# Patient Record
Sex: Female | Born: 1960
Health system: Southern US, Community
[De-identification: ages and names within clinical notes are randomized; demographics above are authoritative.]

## PROBLEM LIST (undated history)

## (undated) DIAGNOSIS — K2270 Barrett's esophagus without dysplasia: Secondary | ICD-10-CM

## (undated) DIAGNOSIS — T7840XA Allergy, unspecified, initial encounter: Secondary | ICD-10-CM

## (undated) DIAGNOSIS — E785 Hyperlipidemia, unspecified: Secondary | ICD-10-CM

## (undated) DIAGNOSIS — K219 Gastro-esophageal reflux disease without esophagitis: Secondary | ICD-10-CM

## (undated) DIAGNOSIS — I10 Essential (primary) hypertension: Secondary | ICD-10-CM

## (undated) HISTORY — DX: Hyperlipidemia, unspecified: E78.5

## (undated) HISTORY — PX: COLONOSCOPY: SHX174

## (undated) HISTORY — PX: TUBAL LIGATION: SHX77

## (undated) HISTORY — DX: Allergy, unspecified, initial encounter: T78.40XA

## (undated) HISTORY — DX: Gastro-esophageal reflux disease without esophagitis: K21.9

## (undated) HISTORY — DX: Essential (primary) hypertension: I10

## (undated) HISTORY — PX: THROAT SURGERY: SHX803

---

## 2007-12-11 ENCOUNTER — Ambulatory Visit: Payer: Self-pay | Admitting: Internal Medicine

## 2007-12-21 ENCOUNTER — Ambulatory Visit: Payer: Self-pay | Admitting: Family Medicine

## 2007-12-23 ENCOUNTER — Ambulatory Visit: Payer: Self-pay | Admitting: Family Medicine

## 2007-12-27 ENCOUNTER — Ambulatory Visit: Payer: Self-pay | Admitting: Family Medicine

## 2008-01-02 ENCOUNTER — Ambulatory Visit: Payer: Self-pay | Admitting: Family Medicine

## 2008-01-09 ENCOUNTER — Ambulatory Visit: Payer: Self-pay | Admitting: Internal Medicine

## 2008-01-10 ENCOUNTER — Ambulatory Visit: Payer: Self-pay | Admitting: Internal Medicine

## 2008-02-10 ENCOUNTER — Ambulatory Visit: Payer: Self-pay | Admitting: Internal Medicine

## 2008-02-22 ENCOUNTER — Ambulatory Visit: Payer: Self-pay | Admitting: Unknown Physician Specialty

## 2008-03-12 ENCOUNTER — Ambulatory Visit: Payer: Self-pay | Admitting: Internal Medicine

## 2008-05-04 ENCOUNTER — Ambulatory Visit: Payer: Self-pay | Admitting: Internal Medicine

## 2008-05-10 ENCOUNTER — Ambulatory Visit: Payer: Self-pay | Admitting: Internal Medicine

## 2008-07-10 ENCOUNTER — Ambulatory Visit: Payer: Self-pay | Admitting: Internal Medicine

## 2008-07-23 ENCOUNTER — Ambulatory Visit: Payer: Self-pay | Admitting: Internal Medicine

## 2008-08-09 ENCOUNTER — Ambulatory Visit: Payer: Self-pay | Admitting: Internal Medicine

## 2008-12-24 ENCOUNTER — Ambulatory Visit: Payer: Self-pay | Admitting: Family Medicine

## 2009-01-05 ENCOUNTER — Emergency Department: Payer: Self-pay | Admitting: Emergency Medicine

## 2009-08-22 ENCOUNTER — Ambulatory Visit: Payer: Self-pay | Admitting: Unknown Physician Specialty

## 2009-10-28 ENCOUNTER — Ambulatory Visit: Payer: Self-pay | Admitting: Gastroenterology

## 2009-11-02 ENCOUNTER — Emergency Department: Payer: Self-pay | Admitting: Unknown Physician Specialty

## 2009-12-26 ENCOUNTER — Ambulatory Visit: Payer: Self-pay | Admitting: Family Medicine

## 2010-09-25 ENCOUNTER — Ambulatory Visit: Payer: Self-pay | Admitting: Family Medicine

## 2011-12-09 ENCOUNTER — Ambulatory Visit: Payer: Self-pay | Admitting: Family Medicine

## 2012-05-20 ENCOUNTER — Ambulatory Visit: Payer: Self-pay | Admitting: Family Medicine

## 2012-12-09 ENCOUNTER — Ambulatory Visit: Payer: Self-pay | Admitting: Family Medicine

## 2012-12-27 ENCOUNTER — Ambulatory Visit: Payer: Self-pay | Admitting: Family Medicine

## 2013-06-27 ENCOUNTER — Ambulatory Visit: Payer: Self-pay | Admitting: Family Medicine

## 2013-11-01 DIAGNOSIS — R195 Other fecal abnormalities: Secondary | ICD-10-CM | POA: Insufficient documentation

## 2013-11-14 ENCOUNTER — Ambulatory Visit: Payer: Self-pay | Admitting: Gastroenterology

## 2013-11-16 LAB — PATHOLOGY REPORT

## 2013-12-29 ENCOUNTER — Ambulatory Visit: Payer: Self-pay | Admitting: Family Medicine

## 2014-07-18 DIAGNOSIS — Z0000 Encounter for general adult medical examination without abnormal findings: Secondary | ICD-10-CM | POA: Insufficient documentation

## 2014-07-18 DIAGNOSIS — K219 Gastro-esophageal reflux disease without esophagitis: Secondary | ICD-10-CM | POA: Insufficient documentation

## 2014-07-18 DIAGNOSIS — E785 Hyperlipidemia, unspecified: Secondary | ICD-10-CM | POA: Insufficient documentation

## 2014-07-18 DIAGNOSIS — IMO0002 Reserved for concepts with insufficient information to code with codable children: Secondary | ICD-10-CM | POA: Insufficient documentation

## 2014-07-18 DIAGNOSIS — E789 Disorder of lipoprotein metabolism, unspecified: Secondary | ICD-10-CM | POA: Insufficient documentation

## 2014-07-18 DIAGNOSIS — E7849 Other hyperlipidemia: Secondary | ICD-10-CM | POA: Insufficient documentation

## 2014-07-18 DIAGNOSIS — S139XXA Sprain of joints and ligaments of unspecified parts of neck, initial encounter: Secondary | ICD-10-CM | POA: Insufficient documentation

## 2014-07-18 DIAGNOSIS — I10 Essential (primary) hypertension: Secondary | ICD-10-CM | POA: Insufficient documentation

## 2014-07-18 DIAGNOSIS — R928 Other abnormal and inconclusive findings on diagnostic imaging of breast: Secondary | ICD-10-CM | POA: Insufficient documentation

## 2014-07-18 DIAGNOSIS — M1710 Unilateral primary osteoarthritis, unspecified knee: Secondary | ICD-10-CM | POA: Insufficient documentation

## 2014-07-18 DIAGNOSIS — A184 Tuberculosis of skin and subcutaneous tissue: Secondary | ICD-10-CM | POA: Insufficient documentation

## 2014-07-18 DIAGNOSIS — S161XXA Strain of muscle, fascia and tendon at neck level, initial encounter: Secondary | ICD-10-CM

## 2014-07-18 DIAGNOSIS — N6459 Other signs and symptoms in breast: Secondary | ICD-10-CM | POA: Insufficient documentation

## 2014-07-18 DIAGNOSIS — M179 Osteoarthritis of knee, unspecified: Secondary | ICD-10-CM | POA: Insufficient documentation

## 2014-07-19 ENCOUNTER — Ambulatory Visit (INDEPENDENT_AMBULATORY_CARE_PROVIDER_SITE_OTHER): Payer: BLUE CROSS/BLUE SHIELD | Admitting: Family Medicine

## 2014-07-19 ENCOUNTER — Encounter: Payer: Self-pay | Admitting: Family Medicine

## 2014-07-19 VITALS — BP 144/100 | HR 80 | Ht 64.0 in | Wt 219.0 lb

## 2014-07-19 DIAGNOSIS — M1712 Unilateral primary osteoarthritis, left knee: Secondary | ICD-10-CM

## 2014-07-19 DIAGNOSIS — I10 Essential (primary) hypertension: Secondary | ICD-10-CM | POA: Diagnosis not present

## 2014-07-19 DIAGNOSIS — E784 Other hyperlipidemia: Secondary | ICD-10-CM

## 2014-07-19 DIAGNOSIS — E7849 Other hyperlipidemia: Secondary | ICD-10-CM

## 2014-07-19 DIAGNOSIS — K219 Gastro-esophageal reflux disease without esophagitis: Secondary | ICD-10-CM

## 2014-07-19 MED ORDER — HYDROCHLOROTHIAZIDE 12.5 MG PO TABS: 12.5000 mg | ORAL_TABLET | Freq: Every day | ORAL | Status: DC

## 2014-07-19 MED ORDER — SIMVASTATIN 20 MG PO TABS: 20.0000 mg | ORAL_TABLET | Freq: Every day | ORAL | Status: DC

## 2014-07-19 MED ORDER — OMEPRAZOLE 20 MG PO CPDR: 20.0000 mg | DELAYED_RELEASE_CAPSULE | Freq: Two times a day (BID) | ORAL | Status: DC

## 2014-07-19 MED ORDER — CELECOXIB 200 MG PO CAPS: 200.0000 mg | ORAL_CAPSULE | Freq: Every day | ORAL | Status: DC

## 2014-07-19 MED ORDER — LISINOPRIL 10 MG PO TABS: 10.0000 mg | ORAL_TABLET | Freq: Every day | ORAL | Status: DC

## 2014-07-19 NOTE — Progress Notes (Signed)
Name: Susan Gregory   MRN: [id number]    DOB: Apr 12, 1960   Date:07/19/2014       Progress Note  Subjective  Chief Complaint  Chief Complaint  Patient presents with  . Hyperlipidemia  . Hypertension  . Knee Pain    celebrex refill  . Allergic Rhinitis     Hyperlipidemia This is a recurrent problem. The current episode started more than 1 year ago. The problem is controlled. Recent lipid tests were reviewed and are normal. She has no history of chronic renal disease, diabetes, hypothyroidism, liver disease, obesity or nephrotic syndrome. There are no known factors aggravating her hyperlipidemia. Pertinent negatives include no chest pain, focal sensory loss, focal weakness, leg pain, myalgias or shortness of breath. Current antihyperlipidemic treatment includes statins. The current treatment provides moderate improvement of lipids. There are no compliance problems.  Risk factors for coronary artery disease include hypertension.  Hypertension The current episode started more than 1 year ago. The problem is unchanged. The problem is uncontrolled. Pertinent negatives include no anxiety, blurred vision, chest pain, headaches, malaise/fatigue, neck pain, orthopnea, palpitations, peripheral edema, PND, shortness of breath or sweats. There are no associated agents to hypertension. Risk factors for coronary artery disease include dyslipidemia. Past treatments include nothing. There is no history of chronic renal disease.  Knee Pain  The incident occurred more than 1 week ago. The incident occurred at home. There was no injury mechanism. The pain is present in the left knee. The quality of the pain is described as aching. The pain is mild. The pain has been fluctuating since onset. Pertinent negatives include no inability to bear weight or loss of motion. She has tried acetaminophen and NSAIDs for the symptoms. The treatment provided mild relief.    No problem-specific assessment & plan notes found for this  encounter.   Past Medical History  Diagnosis Date  . Hypertension   . Hyperlipidemia   . GERD (gastroesophageal reflux disease)     Past Surgical History  Procedure Laterality Date  . Throat surgery      nodules taken off throat    History reviewed. No pertinent family history.  History   Social History  . Marital Status: Married    Spouse Name: N/A  . Number of Children: N/A  . Years of Education: N/A   Occupational History  . Not on file.   Social History Main Topics  . Smoking status: Never Smoker   . Smokeless tobacco: Not on file  . Alcohol Use: No  . Drug Use: No  . Sexual Activity: Not on file   Other Topics Concern  . Not on file   Social History Narrative  . No narrative on file    No Known Allergies   Review of Systems  Constitutional: Negative for malaise/fatigue.  Eyes: Negative for blurred vision.  Respiratory: Negative for shortness of breath.   Cardiovascular: Negative for chest pain, palpitations, orthopnea and PND.  Musculoskeletal: Negative for myalgias and neck pain.  Neurological: Negative for focal weakness and headaches.     Objective  Filed Vitals:   07/19/14 0935  BP: 144/100  Pulse: 80  Height: 5\' 4"  (1.626 m)  Weight: 219 lb (99.338 kg)    Physical Exam    No results found for this or any previous visit (from the past 2160 hour(s)).   Assessment & Plan  Problem List Items Addressed This Visit      Cardiovascular and Mediastinum   Benign hypertension - Primary  Relevant Medications   aspirin 81 MG tablet   hydrochlorothiazide (HYDRODIURIL) 12.5 MG tablet   simvastatin (ZOCOR) 20 MG tablet     Digestive   Acid reflux   Relevant Medications   omeprazole (PRILOSEC) 20 MG capsule   ranitidine (ZANTAC) 300 MG capsule     Musculoskeletal and Integument   Arthritis of knee, degenerative   Relevant Medications   aspirin 81 MG tablet   celecoxib (CELEBREX) 200 MG capsule     Other   Familial multiple  lipoprotein-type hyperlipidemia   Relevant Medications   aspirin 81 MG tablet   hydrochlorothiazide (HYDRODIURIL) 12.5 MG tablet   simvastatin (ZOCOR) 20 MG tablet        Dr. Hayden [facility]  07/19/2014

## 2014-08-30 ENCOUNTER — Other Ambulatory Visit: Payer: Self-pay

## 2014-08-30 ENCOUNTER — Encounter: Payer: Self-pay | Admitting: Family Medicine

## 2014-08-30 ENCOUNTER — Ambulatory Visit (INDEPENDENT_AMBULATORY_CARE_PROVIDER_SITE_OTHER): Payer: BLUE CROSS/BLUE SHIELD | Admitting: Family Medicine

## 2014-08-30 VITALS — BP 126/80 | HR 78 | Wt 211.8 lb

## 2014-08-30 DIAGNOSIS — I10 Essential (primary) hypertension: Secondary | ICD-10-CM

## 2014-08-30 MED ORDER — LISINOPRIL 10 MG PO TABS: 10.0000 mg | ORAL_TABLET | Freq: Every day | ORAL | Status: DC

## 2014-08-30 MED ORDER — HYDROCHLOROTHIAZIDE 12.5 MG PO TABS: 12.5000 mg | ORAL_TABLET | Freq: Every day | ORAL | Status: DC

## 2014-08-30 MED ORDER — LISINOPRIL 10 MG PO TABS
10.0000 mg | ORAL_TABLET | Freq: Every day | ORAL | Status: DC
Start: 2014-08-30 — End: 2014-08-30

## 2014-08-30 NOTE — Progress Notes (Signed)
Name: Susan Gregory   MRN: [id number]    DOB: 1960-06-02   Date:08/30/2014       Progress Note  Subjective  Chief Complaint  Chief Complaint  Patient presents with  . Follow-up    Hypertension     Hypertension This is a recurrent problem. The current episode started more than 1 year ago. The problem has been gradually improving since onset. The problem is controlled. Pertinent negatives include no anxiety, blurred vision, chest pain, headaches, malaise/fatigue, neck pain, orthopnea, palpitations, peripheral edema, PND, shortness of breath or sweats. Agents associated with hypertension include NSAIDs. Risk factors for coronary artery disease include dyslipidemia and obesity. Past treatments include ACE inhibitors and diuretics. The current treatment provides mild improvement. There are no compliance problems.  There is no history of angina, kidney disease, CAD/MI, CVA, heart failure, left ventricular hypertrophy, PVD or retinopathy. There is no history of chronic renal disease.    No problem-specific assessment & plan notes found for this encounter.   Past Medical History  Diagnosis Date  . Hypertension   . Hyperlipidemia   . GERD (gastroesophageal reflux disease)     Past Surgical History  Procedure Laterality Date  . Throat surgery      nodules taken off throat    No family history on file.  History   Social History  . Marital Status: Married    Spouse Name: N/A  . Number of Children: N/A  . Years of Education: N/A   Occupational History  . Not on file.   Social History Main Topics  . Smoking status: Never Smoker   . Smokeless tobacco: Not on file  . Alcohol Use: No  . Drug Use: No  . Sexual Activity: Not on file   Other Topics Concern  . Not on file   Social History Narrative    No Known Allergies   Review of Systems  Constitutional: Negative for malaise/fatigue.  Eyes: Negative for blurred vision and double vision.  Respiratory: Negative for cough,  shortness of breath and wheezing.   Cardiovascular: Negative for chest pain, palpitations, orthopnea and PND.  Gastrointestinal: Negative for heartburn, nausea and vomiting.  Genitourinary: Negative for dysuria, urgency and frequency.  Musculoskeletal: Negative for myalgias, joint pain and neck pain.  Neurological: Negative for dizziness, sensory change, focal weakness and headaches.  Psychiatric/Behavioral: Negative for depression.     Objective  Filed Vitals:   08/30/14 0817  BP: 126/80  Pulse: 78  Weight: 211 lb 12.8 oz (96.072 kg)  SpO2: 98%    Physical Exam  Constitutional: She is well-developed, well-nourished, and in no distress. No distress.  HENT:  Head: Normocephalic and atraumatic.  Right Ear: External ear normal.  Left Ear: External ear normal.  Nose: Nose normal.  Mouth/Throat: Oropharynx is clear and moist.  Eyes: Conjunctivae and EOM are normal. Pupils are equal, round, and reactive to light. Right eye exhibits no discharge. Left eye exhibits no discharge.  Neck: Normal range of motion. Neck supple. No JVD present. No thyromegaly present.  Cardiovascular: Normal rate, regular rhythm, normal heart sounds and intact distal pulses.  Exam reveals no gallop and no friction rub.   No murmur heard. Pulmonary/Chest: Effort normal and breath sounds normal.  Abdominal: Soft. Bowel sounds are normal. She exhibits no mass. There is no tenderness. There is no guarding.  Musculoskeletal: Normal range of motion. She exhibits no edema.  Lymphadenopathy:    She has no cervical adenopathy.  Neurological: She is alert. She has normal  reflexes.  Skin: Skin is warm and dry. She is not diaphoretic.  Psychiatric: Mood and affect normal.      Assessment & Plan  Problem List Items Addressed This Visit      Cardiovascular and Mediastinum   Benign hypertension - Primary   Relevant Medications   lisinopril (PRINIVIL,ZESTRIL) 10 MG tablet   hydrochlorothiazide (HYDRODIURIL) 12.5  MG tablet        Dr. [facility]  08/30/2014

## 2014-11-23 ENCOUNTER — Other Ambulatory Visit: Payer: Self-pay

## 2014-11-26 ENCOUNTER — Encounter: Payer: Self-pay | Admitting: Family Medicine

## 2014-11-26 ENCOUNTER — Ambulatory Visit (INDEPENDENT_AMBULATORY_CARE_PROVIDER_SITE_OTHER): Payer: BLUE CROSS/BLUE SHIELD | Admitting: Family Medicine

## 2014-11-26 VITALS — BP 130/80 | HR 80 | Temp 98.1°F | Ht 64.0 in | Wt 206.0 lb

## 2014-11-26 DIAGNOSIS — I10 Essential (primary) hypertension: Secondary | ICD-10-CM | POA: Diagnosis not present

## 2014-11-26 DIAGNOSIS — J4521 Mild intermittent asthma with (acute) exacerbation: Secondary | ICD-10-CM

## 2014-11-26 DIAGNOSIS — H6983 Other specified disorders of Eustachian tube, bilateral: Secondary | ICD-10-CM | POA: Diagnosis not present

## 2014-11-26 DIAGNOSIS — J0100 Acute maxillary sinusitis, unspecified: Secondary | ICD-10-CM

## 2014-11-26 DIAGNOSIS — Z09 Encounter for follow-up examination after completed treatment for conditions other than malignant neoplasm: Secondary | ICD-10-CM | POA: Diagnosis not present

## 2014-11-26 MED ORDER — ALBUTEROL SULFATE HFA 108 (90 BASE) MCG/ACT IN AERS: 2.0000 | INHALATION_SPRAY | Freq: Four times a day (QID) | RESPIRATORY_TRACT | Status: DC | PRN

## 2014-11-26 MED ORDER — FLUTICASONE PROPIONATE 50 MCG/ACT NA SUSP: 2.0000 | Freq: Every day | NASAL | Status: DC

## 2014-11-26 MED ORDER — AMOXICILLIN 500 MG PO CAPS: 500.0000 mg | ORAL_CAPSULE | Freq: Three times a day (TID) | ORAL | Status: DC

## 2014-11-26 NOTE — Progress Notes (Signed)
Name: Susan Gregory   MRN: [id number]    DOB: 1960-04-21   Date:11/26/2014       Progress Note  Subjective  Chief Complaint  Chief Complaint  Patient presents with  . Sinusitis    drainage in back of throat- went to ER Saturday- did chest x ray with no change compared to last xray.   . Hypertension    had an elevated BP at hospital    Sinusitis This is a chronic problem. The current episode started in the past 7 days. The problem is unchanged. There has been no fever. The fever has been present for less than 1 day. Associated symptoms include chills, congestion, coughing, diaphoresis, ear pain and sinus pressure. Pertinent negatives include no headaches, hoarse voice, neck pain, shortness of breath, sneezing, sore throat or swollen glands. Past treatments include acetaminophen. The treatment provided mild relief.  Hypertension This is a chronic problem. The current episode started in the past 7 days. The problem has been waxing and waning since onset. The problem is controlled. Associated symptoms include sweats. Pertinent negatives include no anxiety, blurred vision, chest pain, headaches, malaise/fatigue, neck pain, orthopnea, palpitations, peripheral edema, PND or shortness of breath.    No problem-specific assessment & plan notes found for this encounter.   Past Medical History  Diagnosis Date  . Hypertension   . Hyperlipidemia   . GERD (gastroesophageal reflux disease)   . Allergy     Past Surgical History  Procedure Laterality Date  . Throat surgery      nodules taken off throat    History reviewed. No pertinent family history.  Social History   Social History  . Marital Status: Married    Spouse Name: N/A  . Number of Children: N/A  . Years of Education: N/A   Occupational History  . Not on file.   Social History Main Topics  . Smoking status: Never Smoker   . Smokeless tobacco: Not on file  . Alcohol Use: No  . Drug Use: No  . Sexual Activity: Not  Currently   Other Topics Concern  . Not on file   Social History Narrative    No Known Allergies   Review of Systems  Constitutional: Positive for chills and diaphoresis. Negative for fever, weight loss and malaise/fatigue.  HENT: Positive for congestion, ear pain and sinus pressure. Negative for ear discharge, hoarse voice, sneezing and sore throat.   Eyes: Negative for blurred vision.  Respiratory: Positive for cough. Negative for sputum production, shortness of breath and wheezing.   Cardiovascular: Negative for chest pain, palpitations, orthopnea, leg swelling and PND.  Gastrointestinal: Negative for heartburn, nausea, abdominal pain, diarrhea, constipation, blood in stool and melena.  Genitourinary: Negative for dysuria, urgency, frequency and hematuria.  Musculoskeletal: Negative for myalgias, back pain, joint pain and neck pain.  Skin: Negative for rash.  Neurological: Negative for dizziness, tingling, sensory change, focal weakness and headaches.  Endo/Heme/Allergies: Negative for environmental allergies and polydipsia. Does not bruise/bleed easily.  Psychiatric/Behavioral: Negative for depression and suicidal ideas. The patient is not nervous/anxious and does not have insomnia.      Objective  Filed Vitals:   11/26/14 1022  BP: 130/80  Pulse: 80  Temp: 98.1 F (36.7 C)  TempSrc: Oral  Height:  (1.626 m)  Weight: 206 lb (93.441 kg)    Physical Exam  Constitutional: She is well-developed, well-nourished, and in no distress. No distress.  HENT:  Head: Normocephalic and atraumatic.  Right Ear: External ear  normal. Tympanic membrane is not retracted.  Left Ear: External ear normal. Tympanic membrane is retracted.  Nose: Nose normal.  Mouth/Throat: Oropharynx is clear and moist.  Eyes: Conjunctivae and EOM are normal. Pupils are equal, round, and reactive to light. Right eye exhibits no discharge. Left eye exhibits no discharge.  Neck: Normal range of motion.  Neck supple. No JVD present. No thyromegaly present.  Cardiovascular: Normal rate, regular rhythm, normal heart sounds and intact distal pulses.  Exam reveals no gallop and no friction rub.   No murmur heard. Pulmonary/Chest: Effort normal. She has wheezes.  Abdominal: Soft. Bowel sounds are normal. She exhibits no mass. There is no tenderness. There is no guarding.  Musculoskeletal: Normal range of motion. She exhibits no edema.  Lymphadenopathy:    She has no cervical adenopathy.  Neurological: She is alert. She has normal reflexes.  Skin: Skin is warm and dry. She is not diaphoretic.  Psychiatric: Mood and affect normal.      Assessment & Plan  Problem List Items Addressed This Visit      Cardiovascular and Mediastinum   Benign hypertension    Other Visit Diagnoses    Hospital discharge follow-up    -  Primary    bp chech from er    Acute maxillary sinusitis, recurrence not specified        Relevant Medications    amoxicillin (AMOXIL) 500 MG capsule    fluticasone (FLONASE) 50 MCG/ACT nasal spray    Eustachian tube dysfunction, bilateral        Relevant Medications    fluticasone (FLONASE) 50 MCG/ACT nasal spray    Reactive airway disease, mild intermittent, with acute exacerbation        Relevant Medications    albuterol (PROVENTIL HFA;VENTOLIN HFA) 108 (90 BASE) MCG/ACT inhaler         Dr. Hayden Rasmusseneanna Joshue [facility]  11/26/2014

## 2014-12-03 ENCOUNTER — Other Ambulatory Visit: Payer: Self-pay

## 2014-12-23 ENCOUNTER — Emergency Department
Admission: EM | Admit: 2014-12-23 | Discharge: 2014-12-23 | Disposition: A | Discharge status: Home / Self Care | Payer: BLUE CROSS/BLUE SHIELD | Attending: Emergency Medicine | Admitting: Emergency Medicine

## 2014-12-23 DIAGNOSIS — Z79899 Other long term (current) drug therapy: Secondary | ICD-10-CM | POA: Insufficient documentation

## 2014-12-23 DIAGNOSIS — R42 Dizziness and giddiness: Secondary | ICD-10-CM | POA: Diagnosis present

## 2014-12-23 DIAGNOSIS — Z791 Long term (current) use of non-steroidal anti-inflammatories (NSAID): Secondary | ICD-10-CM | POA: Insufficient documentation

## 2014-12-23 DIAGNOSIS — I10 Essential (primary) hypertension: Secondary | ICD-10-CM | POA: Diagnosis not present

## 2014-12-23 DIAGNOSIS — Z7951 Long term (current) use of inhaled steroids: Secondary | ICD-10-CM | POA: Diagnosis not present

## 2014-12-23 DIAGNOSIS — Z792 Long term (current) use of antibiotics: Secondary | ICD-10-CM | POA: Diagnosis not present

## 2014-12-23 LAB — BASIC METABOLIC PANEL
ANION GAP: 8 (ref 5–15)
BUN: 15 mg/dL (ref 6–20)
CALCIUM: 9.9 mg/dL (ref 8.9–10.3)
CHLORIDE: 103 mmol/L (ref 101–111)
CO2: 30 mmol/L (ref 22–32)
Creatinine, Ser: 0.92 mg/dL (ref 0.44–1.00)
GFR calc non Af Amer: >60 mL/min (ref >60)
Glucose, Bld: 143 mg/dL — ABNORMAL HIGH (ref 65–99)
POTASSIUM: 3.6 mmol/L (ref 3.5–5.1)
Sodium: 141 mmol/L (ref 135–145)

## 2014-12-23 LAB — CBC
HCT: 42.5 % (ref 35.0–47.0)
HEMOGLOBIN: 13.9 g/dL (ref 12.0–16.0)
MCH: 29 pg (ref 26.0–34.0)
MCHC: 32.8 g/dL (ref 32.0–36.0)
MCV: 88.4 fL (ref 80.0–100.0)
Platelets: 182 10*3/uL (ref 150–440)
RBC: 4.8 MIL/uL (ref 3.80–5.20)
RDW: 14.3 % (ref 11.5–14.5)
WBC: 4.1 10*3/uL (ref 3.6–11.0)

## 2014-12-23 LAB — URINALYSIS COMPLETE WITH MICROSCOPIC (ARMC ONLY)
Bacteria, UA: NONE SEEN
Bilirubin Urine: NEGATIVE
Glucose, UA: NEGATIVE mg/dL
Hgb urine dipstick: NEGATIVE
KETONES UR: NEGATIVE mg/dL
Leukocytes, UA: NEGATIVE
Nitrite: NEGATIVE
PH: 5 (ref 5.0–8.0)
PROTEIN: 100 mg/dL — AB
SPECIFIC GRAVITY, URINE: 1.021 (ref 1.005–1.030)

## 2014-12-23 LAB — GLUCOSE, CAPILLARY: Glucose-Capillary: 119 mg/dL — ABNORMAL HIGH (ref 65–99)

## 2014-12-23 MED ORDER — MECLIZINE HCL 12.5 MG PO TABS: 25.0000 mg | ORAL_TABLET | Freq: Three times a day (TID) | ORAL | Status: DC | PRN

## 2014-12-23 MED ORDER — MECLIZINE HCL 25 MG PO TABS
25.0000 mg | ORAL_TABLET | Freq: Once | ORAL | Status: AC
  Administered 2014-12-23: 25 mg via ORAL
  Filled 2014-12-23: qty 1

## 2014-12-23 NOTE — ED Notes (Signed)
Pt states that approximately an hour ago she started having weakness and dizziness, states that she checked her bp and it was elevated, pt states that she then begin to vomit.  Pt states that she cont to have the dizziness. Pt denies weakness on either side of her body, states it's all over

## 2014-12-23 NOTE — ED Notes (Signed)
Pt ambulated to discharge with no difficulty noted; pt verbalized understanding of discharge instructions and follow up care.

## 2014-12-23 NOTE — ED Provider Notes (Signed)
[facility] Emergency Department Provider Note  ____________________________________________  Time seen: 9  I have reviewed the triage vital signs and the nursing notes.   HISTORY  Chief Complaint Dizziness     HPI Susan Gregory is a 54 y.o. female who reports that she had sudden onset dizziness and roaring in her left ear. She felt off balance when this began. She felt nauseous and had some emesis. She continues to have some discomfort in her left ear and vertigo, though it is improving currently. She has not had symptoms like this before. She does have a history of hypertension. With the onset of symptoms she took her blood pressure and noted her pressure to be elevated at 180/105.    Past Medical History  Diagnosis Date  . Hypertension   . Hyperlipidemia   . GERD (gastroesophageal reflux disease)   . Allergy     Patient Active Problem List   Diagnosis Date Noted  . Familial multiple lipoprotein-type hyperlipidemia 07/18/2014  . Disorder of lipoprotein and lipid metabolism 07/18/2014  . Benign hypertension 07/18/2014  . Acid reflux 07/18/2014  . Encounter for general adult medical examination without abnormal findings 07/18/2014  . Neck sprain and strain 07/18/2014  . Abnormal breast finding 07/18/2014  . Abnormal finding on breast imaging 07/18/2014  . Arthritis of knee, degenerative 07/18/2014  . TB skin/subcutaneous 07/18/2014  . Fecal occult blood test positive 11/01/2013    Past Surgical History  Procedure Laterality Date  . Throat surgery      nodules taken off throat    Current Outpatient Rx  Name  Route  Sig  Dispense  Refill  . albuterol (PROVENTIL HFA;VENTOLIN HFA) 108 (90 BASE) MCG/ACT inhaler   Inhalation   Inhale 2 puffs into the lungs every 6 (six) hours as needed for wheezing or shortness of breath.   1 Inhaler   0   . amoxicillin (AMOXIL) 500 MG capsule   Oral   Take 1 capsule (500 mg total) by mouth 3 (three)  times daily.   30 capsule   0   . aspirin 81 MG tablet   Oral   Take 1 tablet by mouth daily.         . celecoxib (CELEBREX) 200 MG capsule   Oral   Take 1 capsule (200 mg total) by mouth daily.   30 capsule   5   . cetirizine (ZYRTEC) 10 MG tablet   Oral   Take 1 tablet by mouth as needed.         . clobetasol (TEMOVATE) 0.05 % external solution   Topical   Apply 1 application topically 2 (two) times daily. Dr Cheree Ditto         . fluticasone [facility]) 50 MCG/ACT nasal spray   Each Nare   Place 2 sprays into both nostrils daily.   16 g   6   . griseofulvin (GRIFULVIN V) 500 MG tablet   Oral   Take 500 mg by mouth 2 (two) times daily.      5   . hydrochlorothiazide (HYDRODIURIL) 12.5 MG tablet   Oral   Take 1 tablet (12.5 mg total) by mouth daily.   30 tablet   6   . ketoconazole (NIZORAL) 2 % shampoo   Topical   Apply 1 application topically as needed. Dr Cheree Ditto         . ketoconazole (NIZORAL) 2 % shampoo   Topical   Apply 1 application topically as directed. Dr  Graham      5   . lisinopril (PRINIVIL,ZESTRIL) 10 MG tablet   Oral   Take 1 tablet (10 mg total) by mouth daily.   30 tablet   6   . meclizine (ANTIVERT) 12.5 MG tablet   Oral   Take 2 tablets (25 mg total) by mouth 3 (three) times daily as needed for dizziness.   12 tablet   0   . omeprazole (PRILOSEC) 20 MG capsule   Oral   Take 1 capsule (20 mg total) by mouth 2 (two) times daily. GI doctor   30 capsule   6   . ranitidine (ZANTAC) 300 MG capsule   Oral   Take 300 mg by mouth daily. GI Doctor      0   . simvastatin (ZOCOR) 20 MG tablet   Oral   Take 1 tablet (20 mg total) by mouth daily.   30 tablet   6     Allergies Review of patient's allergies indicates no known allergies.  No family history on file.  Social History Social History  Substance Use Topics  . Smoking status: Never Smoker   . Smokeless tobacco: Not on file  . Alcohol Use: No    Review of  Systems  Constitutional: Negative for fatigue. ENT: Negative for congestion. Cardiovascular: Negative for chest pain. Respiratory: Negative for cough. Gastrointestinal: Negative for abdominal pain, vomiting and diarrhea. Genitourinary: Negative for dysuria. Musculoskeletal: No myalgias or injuries. Skin: Negative for rash. Neurological: Notable for vertigo. See history of present illness   10-point ROS otherwise negative.  ____________________________________________   PHYSICAL EXAM:  VITAL SIGNS: ED Triage Vitals  Enc Vitals Group     BP 12/23/14 1845 184/107 mmHg     Pulse Rate 12/23/14 1845 71     Resp 12/23/14 1845 18     Temp 12/23/14 1845 98.6 F (37 C)     Temp Source 12/23/14 1845 Oral     SpO2 12/23/14 1845 98 %     Weight 12/23/14 1845 200 lb (90.719 kg)     Height 12/23/14 1845 5\' 4"  (1.626 m)     Head Cir --      Peak Flow --      Pain Score 12/23/14 1846 0     Pain Loc --      Pain Edu? --      Excl. in GC? --     Constitutional:  Alert and oriented. No distress. ENT   Head: Normocephalic and atraumatic.   Nose: No congestion/rhinnorhea.       Mouth: No erythema, no swelling   Cardiovascular: Normal rate, regular rhythm, no murmur noted Respiratory:  Normal respiratory effort, no tachypnea.    Breath sounds are clear and equal bilaterally.  Gastrointestinal: Soft and nontender. No distention.  Back: No muscle spasm, no tenderness, no CVA tenderness. Musculoskeletal: No deformity noted. Nontender with normal range of motion in all extremities.  No noted edema. Neurologic:  Communicative. Over 5 strength in all 4 extremities, equal grip strength, negative pronator drift, negative Romberg, good finger to nose coordination, cranial nerves II through XII are intact. Sensation is intact. Ambulatory. No gross focal neurologic deficits are appreciated.  Skin:  Skin is warm, dry. No rash noted. Psychiatric: Mood and affect are normal. Speech and behavior  are normal.  ____________________________________________    LABS (pertinent positives/negatives)  Labs Reviewed  BASIC METABOLIC PANEL - Abnormal; Notable for the following:    Glucose, Bld 143 (*)  All other components within normal limits  URINALYSIS COMPLETEWITH MICROSCOPIC ([facility] ONLY) - Abnormal; Notable for the following:    Color, Urine YELLOW (*)    APPearance CLEAR (*)    Protein, ur 100 (*)    Squamous Epithelial / LPF 0-5 (*)    All other components within normal limits  GLUCOSE, CAPILLARY - Abnormal; Notable for the following:    Glucose-Capillary 119 (*)    All other components within normal limits  CBC  CBG MONITORING, ED     ____________________________________________   EKG  ED ECG REPORT I, Zaiyah Sottile W, the attending physician, personally viewed and interpreted this ECG.   Date: 12/23/2014  EKG Time: 1850  Rate: 68  Rhythm: Normal sinus rhythm  Axis: Normal  Intervals: Normal  ST&T Change: None noted   ____________________________________________   ____________________________________________   INITIAL IMPRESSION / ASSESSMENT AND PLAN / ED COURSE  Pertinent labs & imaging results that were available during my care of the patient were reviewed by me and considered in my medical decision making (see chart for details).  Overall well-appearing 35 are all female with acute onset vertigo with roaring in her left ear proximally one hour prior to arrival. The patient reports she is beginning to feel better. Her neuro exam is negative. This appears to be peripheral vertigo. I discussed the option of getting a CT scan with the patient, though I do not see an indication given her improvement and benign appearance and negative exam. We will treat her with meclizine, observe her in the emergency department for approximately an hour, and reassess her.  ____________________________________________   FINAL CLINICAL IMPRESSION(S) / ED DIAGNOSES  Final  diagnoses:  Vertigo      Darien Ramus, MD 12/23/14 2032

## 2014-12-23 NOTE — Discharge Instructions (Signed)
You appear to have benign positional vertigo. This is likely an inner ear problem for you. You improved significantly while in the emergency department. Take meclizine if you have further symptoms. Return to the emergency department if it returns and won't go away, if you have a bad headache, if you have any other sort of focal weakness, or if you have other urgent concerns.  Benign Positional Vertigo Vertigo is the feeling that you or your surroundings are moving when they are not. Benign positional vertigo is the most common form of vertigo. The cause of this condition is not serious (is benign). This condition is triggered by certain movements and positions (is positional). This condition can be dangerous if it occurs while you are doing something that could endanger you or others, such as driving.  CAUSES In many cases, the cause of this condition is not known. It may be caused by a disturbance in an area of the inner ear that helps your brain to sense movement and balance. This disturbance can be caused by a viral infection (labyrinthitis), head injury, or repetitive motion. RISK FACTORS This condition is more likely to develop in:  Women.  People who are 56 years of age or older. SYMPTOMS Symptoms of this condition usually happen when you move your head or your eyes in different directions. Symptoms may start suddenly, and they usually last for less than a minute. Symptoms may include:  Loss of balance and falling.  Feeling like you are spinning or moving.  Feeling like your surroundings are spinning or moving.  Nausea and vomiting.  Blurred vision.  Dizziness.  Involuntary eye movement (nystagmus). Symptoms can be mild and cause only slight annoyance, or they can be severe and interfere with daily life. Episodes of benign positional vertigo may return (recur) over time, and they may be triggered by certain movements. Symptoms may improve over time. DIAGNOSIS This condition is  usually diagnosed by medical history and a physical exam of the head, neck, and ears. You may be referred to a health care provider who specializes in ear, nose, and throat (ENT) problems (otolaryngologist) or a provider who specializes in disorders of the nervous system (neurologist). You may have additional testing, including:  MRI.  A CT scan.  Eye movement tests. Your health care provider may ask you to change positions quickly while he or she watches you for symptoms of benign positional vertigo, such as nystagmus. Eye movement may be tested with an electronystagmogram (ENG), caloric stimulation, the Dix-Hallpike test, or the roll test.  An electroencephalogram (EEG). This records electrical activity in your brain.  Hearing tests. TREATMENT Usually, your health care provider will treat this by moving your head in specific positions to adjust your inner ear back to normal. Surgery may be needed in severe cases, but this is rare. In some cases, benign positional vertigo may resolve on its own in 2-4 weeks. HOME CARE INSTRUCTIONS Safety  Move slowly.Avoid sudden body or head movements.  Avoid driving.  Avoid operating heavy machinery.  Avoid doing any tasks that would be dangerous to you or others if a vertigo episode would occur.  If you have trouble walking or keeping your balance, try using a cane for stability. If you feel dizzy or unstable, sit down right away.  Return to your normal activities as told by your health care provider. Ask your health care provider what activities are safe for you. General Instructions  Take over-the-counter and prescription medicines only as told by your health  care provider.  Avoid certain positions or movements as told by your health care provider.  Drink enough fluid to keep your urine clear or pale yellow.  Keep all follow-up visits as told by your health care provider. This is important. SEEK MEDICAL CARE IF:  You have a fever.  Your  condition gets worse or you develop new symptoms.  Your family or friends notice any behavioral changes.  Your nausea or vomiting gets worse.  You have numbness or a "pins and needles" sensation. SEEK IMMEDIATE MEDICAL CARE IF:  You have difficulty speaking or moving.  You are always dizzy.  You faint.  You develop severe headaches.  You have weakness in your legs or arms.  You have changes in your hearing or vision.  You develop a stiff neck.  You develop sensitivity to light.   This information is not intended to replace advice given to you by your health care provider. Make sure you discuss any questions you have with your health care provider.   Document Released: 11/03/2005 Document Revised: 10/17/2014 Document Reviewed: 05/21/2014 Elsevier Interactive Patient Education [employer]

## 2015-01-15 ENCOUNTER — Other Ambulatory Visit: Payer: Self-pay | Admitting: Family Medicine

## 2015-01-16 ENCOUNTER — Other Ambulatory Visit: Payer: Self-pay | Admitting: Family Medicine

## 2015-01-29 ENCOUNTER — Other Ambulatory Visit: Payer: Self-pay | Admitting: Family Medicine

## 2015-02-02 ENCOUNTER — Other Ambulatory Visit: Payer: Self-pay | Admitting: Family Medicine

## 2015-03-04 ENCOUNTER — Other Ambulatory Visit: Payer: Self-pay

## 2015-03-27 ENCOUNTER — Other Ambulatory Visit: Payer: Self-pay | Admitting: Family Medicine

## 2015-04-21 ENCOUNTER — Other Ambulatory Visit: Payer: Self-pay | Admitting: Family Medicine

## 2015-05-15 ENCOUNTER — Other Ambulatory Visit: Payer: Self-pay | Admitting: Family Medicine

## 2015-05-21 ENCOUNTER — Other Ambulatory Visit: Payer: Self-pay

## 2015-05-22 ENCOUNTER — Ambulatory Visit (INDEPENDENT_AMBULATORY_CARE_PROVIDER_SITE_OTHER): Payer: BLUE CROSS/BLUE SHIELD | Admitting: Family Medicine

## 2015-05-22 ENCOUNTER — Encounter: Payer: Self-pay | Admitting: Family Medicine

## 2015-05-22 VITALS — BP 138/80 | HR 78 | Ht 64.0 in | Wt 206.0 lb

## 2015-05-22 DIAGNOSIS — J4521 Mild intermittent asthma with (acute) exacerbation: Secondary | ICD-10-CM | POA: Diagnosis not present

## 2015-05-22 DIAGNOSIS — J4520 Mild intermittent asthma, uncomplicated: Secondary | ICD-10-CM

## 2015-05-22 DIAGNOSIS — E7849 Other hyperlipidemia: Secondary | ICD-10-CM

## 2015-05-22 DIAGNOSIS — I10 Essential (primary) hypertension: Secondary | ICD-10-CM

## 2015-05-22 DIAGNOSIS — M1712 Unilateral primary osteoarthritis, left knee: Secondary | ICD-10-CM

## 2015-05-22 DIAGNOSIS — E784 Other hyperlipidemia: Secondary | ICD-10-CM | POA: Diagnosis not present

## 2015-05-22 DIAGNOSIS — L219 Seborrheic dermatitis, unspecified: Secondary | ICD-10-CM | POA: Diagnosis not present

## 2015-05-22 DIAGNOSIS — K219 Gastro-esophageal reflux disease without esophagitis: Secondary | ICD-10-CM

## 2015-05-22 MED ORDER — HYDROCHLOROTHIAZIDE 12.5 MG PO CAPS: ORAL_CAPSULE | ORAL | Status: DC

## 2015-05-22 MED ORDER — CELECOXIB 200 MG PO CAPS: 200.0000 mg | ORAL_CAPSULE | Freq: Every day | ORAL | Status: DC

## 2015-05-22 MED ORDER — RANITIDINE HCL 300 MG PO CAPS: 300.0000 mg | ORAL_CAPSULE | Freq: Every day | ORAL | Status: DC

## 2015-05-22 MED ORDER — LISINOPRIL 10 MG PO TABS: ORAL_TABLET | ORAL | Status: DC

## 2015-05-22 MED ORDER — SIMVASTATIN 20 MG PO TABS: 20.0000 mg | ORAL_TABLET | Freq: Every day | ORAL | Status: DC

## 2015-05-22 MED ORDER — ALBUTEROL SULFATE HFA 108 (90 BASE) MCG/ACT IN AERS: 2.0000 | INHALATION_SPRAY | Freq: Four times a day (QID) | RESPIRATORY_TRACT | Status: DC | PRN

## 2015-05-22 MED ORDER — KETOCONAZOLE 2 % EX SHAM: 1.0000 "application " | MEDICATED_SHAMPOO | CUTANEOUS | Status: DC | PRN

## 2015-05-22 NOTE — Progress Notes (Signed)
Name: Susan Gregory   MRN: [id number]    DOB: 1960-08-12   Date:05/22/2015       Progress Note  Subjective  Chief Complaint  Chief Complaint  Patient presents with  . Hypertension  . Arthritis  . Hyperlipidemia  . Gastroesophageal Reflux    Hypertension This is a chronic problem. The current episode started more than 1 year ago. The problem has been gradually improving since onset. The problem is controlled. Pertinent negatives include no anxiety, blurred vision, chest pain, headaches, malaise/fatigue, neck pain, orthopnea, palpitations, peripheral edema, PND, shortness of breath or sweats. Risk factors for coronary artery disease include post-menopausal state and dyslipidemia. Past treatments include ACE inhibitors and diuretics. The current treatment provides mild improvement. There are no compliance problems.  There is no history of angina, kidney disease, CAD/MI, CVA, heart failure, left ventricular hypertrophy, PVD, renovascular disease or retinopathy. There is no history of chronic renal disease or a hypertension causing med.  Arthritis Presents for follow-up visit. The disease course has been improving. She complains of pain. She reports no stiffness, joint swelling or joint warmth. Affected location: right ileopsoas. Pertinent negatives include no diarrhea, dry eyes, dry mouth, dysuria, fatigue, fever, pain at night, pain while resting, rash, Raynaud's syndrome, uveitis or weight loss.  Hyperlipidemia This is a chronic problem. The current episode started more than 1 year ago. The problem is controlled. Recent lipid tests were reviewed and are normal. She has no history of chronic renal disease, diabetes, hypothyroidism, liver disease, obesity or nephrotic syndrome. Pertinent negatives include no chest pain, focal sensory loss, focal weakness, leg pain, myalgias or shortness of breath. Current antihyperlipidemic treatment includes statins. The current treatment provides moderate improvement  of lipids. There are no compliance problems.   Gastroesophageal Reflux She reports no abdominal pain, no chest pain, no coughing, no heartburn, no nausea, no sore throat or no wheezing. This is a recurrent problem. The current episode started 1 to 4 weeks ago. The problem has been waxing and waning. The symptoms are aggravated by certain foods. Pertinent negatives include no fatigue, melena or weight loss. Risk factors include NSAIDs. She has tried a PPI for the symptoms.    No problem-specific assessment & plan notes found for this encounter.   Past Medical History  Diagnosis Date  . Hypertension   . Hyperlipidemia   . GERD (gastroesophageal reflux disease)   . Allergy     Past Surgical History  Procedure Laterality Date  . Throat surgery      nodules taken off throat    History reviewed. No pertinent family history.  Social History   Social History  . Marital Status: Married    Spouse Name: N/A  . Number of Children: N/A  . Years of Education: N/A   Occupational History  . Not on file.   Social History Main Topics  . Smoking status: Never Smoker   . Smokeless tobacco: Not on file  . Alcohol Use: No  . Drug Use: No  . Sexual Activity: Not Currently   Other Topics Concern  . Not on file   Social History Narrative    No Known Allergies   Review of Systems  Constitutional: Negative for fever, chills, weight loss, malaise/fatigue and fatigue.  HENT: Negative for ear discharge, ear pain and sore throat.   Eyes: Negative for blurred vision.  Respiratory: Negative for cough, sputum production, shortness of breath and wheezing.   Cardiovascular: Negative for chest pain, palpitations, orthopnea, leg swelling and PND.  Gastrointestinal: Negative for heartburn, nausea, abdominal pain, diarrhea, constipation, blood in stool and melena.  Genitourinary: Negative for dysuria, urgency, frequency and hematuria.  Musculoskeletal: Positive for arthritis. Negative for  myalgias, back pain, joint pain, joint swelling, stiffness and neck pain.  Skin: Negative for rash.  Neurological: Negative for dizziness, tingling, sensory change, focal weakness and headaches.  Endo/Heme/Allergies: Negative for environmental allergies and polydipsia. Does not bruise/bleed easily.  Psychiatric/Behavioral: Negative for depression and suicidal ideas. The patient is not nervous/anxious and does not have insomnia.      Objective  Filed Vitals:   05/22/15 0904  BP: 138/80  Pulse: 78  Height: 5\' 4"  (1.626 m)  Weight: 206 lb (93.441 kg)    Physical Exam  Constitutional: She is well-developed, well-nourished, and in no distress. No distress.  HENT:  Head: Normocephalic and atraumatic.  Right Ear: External ear normal.  Left Ear: External ear normal.  Nose: Nose normal.  Mouth/Throat: Oropharynx is clear and moist.  Eyes: Conjunctivae and EOM are normal. Pupils are equal, round, and reactive to light. Right eye exhibits no discharge. Left eye exhibits no discharge.  Neck: Normal range of motion. Neck supple. No JVD present. No thyromegaly present.  Cardiovascular: Normal rate, regular rhythm, normal heart sounds and intact distal pulses.  Exam reveals no gallop and no friction rub.   No murmur heard. Pulmonary/Chest: Effort normal and breath sounds normal.  Abdominal: Soft. Bowel sounds are normal. She exhibits no mass. There is no tenderness. There is no guarding.  Musculoskeletal: Normal range of motion. She exhibits no edema.  Lymphadenopathy:    She has no cervical adenopathy.  Neurological: She is alert. She has normal reflexes.  Skin: Skin is warm and dry. She is not diaphoretic.  Psychiatric: Mood and affect normal.  Nursing note and vitals reviewed.     Assessment & Plan  Problem List Items Addressed This Visit      Cardiovascular and Mediastinum   Benign hypertension - Primary   Relevant Medications   hydrochlorothiazide (MICROZIDE) 12.5 MG capsule    lisinopril (PRINIVIL,ZESTRIL) 10 MG tablet   simvastatin (ZOCOR) 20 MG tablet     Digestive   Acid reflux   Relevant Medications   ranitidine (ZANTAC) 300 MG capsule     Musculoskeletal and Integument   Arthritis of knee, degenerative   Relevant Medications   celecoxib (CELEBREX) 200 MG capsule     Other   Familial multiple lipoprotein-type hyperlipidemia   Relevant Medications   hydrochlorothiazide (MICROZIDE) 12.5 MG capsule   lisinopril (PRINIVIL,ZESTRIL) 10 MG tablet   simvastatin (ZOCOR) 20 MG tablet    Other Visit Diagnoses    Seborrhea        Relevant Medications    ketoconazole (NIZORAL) 2 % shampoo    Reactive airway disease, mild intermittent, uncomplicated        Reactive airway disease, mild intermittent, with acute exacerbation        Relevant Medications    albuterol (PROVENTIL HFA;VENTOLIN HFA) 108 (90 Base) MCG/ACT inhaler         Dr. Hayden Rasmusseneanna Jones [facility]  05/22/2015

## 2015-11-17 ENCOUNTER — Other Ambulatory Visit: Payer: Self-pay | Admitting: Family Medicine

## 2015-11-19 ENCOUNTER — Ambulatory Visit
Admission: RE | Admit: 2015-11-19 | Discharge: 2015-11-19 | Disposition: A | Discharge status: Home / Self Care | Payer: BLUE CROSS/BLUE SHIELD | Source: Ambulatory Visit | Attending: Family Medicine | Admitting: Family Medicine

## 2015-11-19 ENCOUNTER — Encounter: Payer: Self-pay | Admitting: Family Medicine

## 2015-11-19 ENCOUNTER — Ambulatory Visit (INDEPENDENT_AMBULATORY_CARE_PROVIDER_SITE_OTHER): Payer: BLUE CROSS/BLUE SHIELD | Admitting: Family Medicine

## 2015-11-19 VITALS — BP 128/66 | HR 66 | Ht 64.0 in | Wt 210.0 lb

## 2015-11-19 DIAGNOSIS — M79662 Pain in left lower leg: Secondary | ICD-10-CM | POA: Diagnosis not present

## 2015-11-19 DIAGNOSIS — M1712 Unilateral primary osteoarthritis, left knee: Secondary | ICD-10-CM

## 2015-11-19 MED ORDER — CELECOXIB 200 MG PO CAPS: 200.0000 mg | ORAL_CAPSULE | Freq: Every day | ORAL | 5 refills | Status: DC

## 2015-11-19 NOTE — Patient Instructions (Signed)

## 2015-11-19 NOTE — Progress Notes (Signed)
Name: Susan Gregory   MRN: [id number]    DOB: 07-Oct-1960   Date:11/19/2015       Progress Note  Subjective  Chief Complaint  Chief Complaint  Patient presents with  . Knee Pain    L) knee - soreness when walking, but feels no pain when sitting / Ibprophen and Celebrex do not help    Knee Pain   The incident occurred more than 1 week ago. There was no injury mechanism. The pain is present in the left knee. The quality of the pain is described as aching ("sore pain"). The pain is at a severity of 8/10. The pain is moderate. The pain has been fluctuating since onset. Pertinent negatives include no tingling. The symptoms are aggravated by movement (sitting to standing). She has tried NSAIDs for the symptoms. The treatment provided no relief.    No problem-specific Assessment & Plan notes found for this encounter.   Past Medical History:  Diagnosis Date  . Allergy   . GERD (gastroesophageal reflux disease)   . Hyperlipidemia   . Hypertension     Past Surgical History:  Procedure Laterality Date  . THROAT SURGERY     nodules taken off throat    History reviewed. No pertinent family history.  Social History   Social History  . Marital status: Married    Spouse name: N/A  . Number of children: N/A  . Years of education: N/A   Occupational History  . Not on file.   Social History Main Topics  . Smoking status: Never Smoker  . Smokeless tobacco: Not on file  . Alcohol use No  . Drug use: No  . Sexual activity: Not Currently   Other Topics Concern  . Not on file   Social History Narrative  . No narrative on file    No Known Allergies   Review of Systems  Constitutional: Negative for chills, fever, malaise/fatigue and weight loss.  HENT: Negative for ear discharge, ear pain and sore throat.   Eyes: Negative for blurred vision.  Respiratory: Negative for cough, sputum production, shortness of breath and wheezing.   Cardiovascular: Negative for chest pain,  palpitations and leg swelling.  Gastrointestinal: Negative for abdominal pain, blood in stool, constipation, diarrhea, heartburn, melena and nausea.  Genitourinary: Negative for dysuria, frequency, hematuria and urgency.  Musculoskeletal: Negative for back pain, joint pain, myalgias and neck pain.  Skin: Negative for rash.  Neurological: Negative for dizziness, tingling, sensory change, focal weakness and headaches.  Endo/Heme/Allergies: Negative for environmental allergies and polydipsia. Does not bruise/bleed easily.  Psychiatric/Behavioral: Negative for depression and suicidal ideas. The patient is not nervous/anxious and does not have insomnia.      Objective  Vitals:   11/19/15 1113  BP: 128/66  Pulse: 66  Weight: 210 lb (95.3 kg)  Height: 5\' 4"  (1.626 m)    Physical Exam  Constitutional: She is well-developed, well-nourished, and in no distress. No distress.  HENT:  Head: Normocephalic and atraumatic.  Right Ear: External ear normal.  Left Ear: External ear normal.  Nose: Nose normal.  Mouth/Throat: Oropharynx is clear and moist.  Eyes: Conjunctivae and EOM are normal. Pupils are equal, round, and reactive to light. Right eye exhibits no discharge. Left eye exhibits no discharge.  Neck: Normal range of motion. Neck supple. No JVD present. No thyromegaly present.  Cardiovascular: Normal rate, regular rhythm, normal heart sounds and intact distal pulses.  Exam reveals no gallop and no friction rub.   No murmur  heard. Pulmonary/Chest: Effort normal and breath sounds normal. She has no wheezes. She has no rales.  Abdominal: Soft. Bowel sounds are normal. She exhibits no mass. There is no tenderness. There is no guarding.  Musculoskeletal: Normal range of motion. She exhibits no edema or tenderness.  Lymphadenopathy:    She has no cervical adenopathy.  Neurological: She is alert. She has normal reflexes.  Skin: Skin is warm and dry. She is not diaphoretic.  Psychiatric: Mood  and affect normal.  Nursing note and vitals reviewed.     Assessment & Plan  Problem List Items Addressed This Visit      Musculoskeletal and Integument   Arthritis of knee, degenerative   Relevant Medications   celecoxib (CELEBREX) 200 MG capsule    Other Visit Diagnoses    Pain of left lower leg    -  Primary   Relevant Medications   celecoxib (CELEBREX) 200 MG capsule   Other Relevant Orders   DG Knee Complete 4 Views Left        Dr. Elizabeth Sauereanna Briunna [facility]  11/19/15

## 2015-12-16 ENCOUNTER — Other Ambulatory Visit: Payer: Self-pay | Admitting: Family Medicine

## 2015-12-16 DIAGNOSIS — E7849 Other hyperlipidemia: Secondary | ICD-10-CM

## 2015-12-16 DIAGNOSIS — I10 Essential (primary) hypertension: Secondary | ICD-10-CM

## 2015-12-19 ENCOUNTER — Other Ambulatory Visit: Payer: Self-pay

## 2016-01-13 ENCOUNTER — Other Ambulatory Visit: Payer: Self-pay | Admitting: Family Medicine

## 2016-01-13 DIAGNOSIS — I10 Essential (primary) hypertension: Secondary | ICD-10-CM

## 2016-02-11 ENCOUNTER — Other Ambulatory Visit: Payer: Self-pay | Admitting: Family Medicine

## 2016-02-11 DIAGNOSIS — I10 Essential (primary) hypertension: Secondary | ICD-10-CM

## 2016-03-13 ENCOUNTER — Other Ambulatory Visit: Payer: Self-pay | Admitting: Family Medicine

## 2016-03-13 DIAGNOSIS — I10 Essential (primary) hypertension: Secondary | ICD-10-CM

## 2016-03-13 DIAGNOSIS — E7849 Other hyperlipidemia: Secondary | ICD-10-CM

## 2016-03-16 ENCOUNTER — Other Ambulatory Visit: Payer: Self-pay

## 2016-03-18 ENCOUNTER — Other Ambulatory Visit: Payer: Self-pay | Admitting: Family Medicine

## 2016-03-18 DIAGNOSIS — I10 Essential (primary) hypertension: Secondary | ICD-10-CM

## 2016-04-09 ENCOUNTER — Other Ambulatory Visit: Payer: Self-pay | Admitting: Family Medicine

## 2016-04-09 DIAGNOSIS — I10 Essential (primary) hypertension: Secondary | ICD-10-CM

## 2016-04-13 ENCOUNTER — Other Ambulatory Visit: Payer: Self-pay | Admitting: Family Medicine

## 2016-04-17 ENCOUNTER — Encounter: Payer: Self-pay | Admitting: *Deleted

## 2016-04-20 ENCOUNTER — Encounter
Admission: RE | Disposition: A | Discharge status: Home / Self Care | Payer: Self-pay | Source: Ambulatory Visit | Attending: Gastroenterology

## 2016-04-20 ENCOUNTER — Ambulatory Visit: Payer: BLUE CROSS/BLUE SHIELD | Admitting: Registered Nurse

## 2016-04-20 ENCOUNTER — Ambulatory Visit
Admission: RE | Admit: 2016-04-20 | Discharge: 2016-04-20 | Disposition: A | Discharge status: Home / Self Care | Payer: BLUE CROSS/BLUE SHIELD | Source: Ambulatory Visit | Attending: Gastroenterology | Admitting: Gastroenterology

## 2016-04-20 ENCOUNTER — Encounter: Payer: Self-pay | Admitting: *Deleted

## 2016-04-20 DIAGNOSIS — K2980 Duodenitis without bleeding: Secondary | ICD-10-CM | POA: Diagnosis not present

## 2016-04-20 DIAGNOSIS — Z09 Encounter for follow-up examination after completed treatment for conditions other than malignant neoplasm: Secondary | ICD-10-CM | POA: Diagnosis not present

## 2016-04-20 DIAGNOSIS — K3189 Other diseases of stomach and duodenum: Secondary | ICD-10-CM | POA: Insufficient documentation

## 2016-04-20 DIAGNOSIS — K210 Gastro-esophageal reflux disease with esophagitis: Secondary | ICD-10-CM | POA: Insufficient documentation

## 2016-04-20 DIAGNOSIS — K2270 Barrett's esophagus without dysplasia: Secondary | ICD-10-CM | POA: Diagnosis not present

## 2016-04-20 DIAGNOSIS — Z7982 Long term (current) use of aspirin: Secondary | ICD-10-CM | POA: Diagnosis not present

## 2016-04-20 DIAGNOSIS — K259 Gastric ulcer, unspecified as acute or chronic, without hemorrhage or perforation: Secondary | ICD-10-CM | POA: Diagnosis not present

## 2016-04-20 DIAGNOSIS — K2950 Unspecified chronic gastritis without bleeding: Secondary | ICD-10-CM | POA: Insufficient documentation

## 2016-04-20 DIAGNOSIS — Z79899 Other long term (current) drug therapy: Secondary | ICD-10-CM | POA: Diagnosis not present

## 2016-04-20 DIAGNOSIS — I10 Essential (primary) hypertension: Secondary | ICD-10-CM | POA: Insufficient documentation

## 2016-04-20 DIAGNOSIS — E785 Hyperlipidemia, unspecified: Secondary | ICD-10-CM | POA: Diagnosis not present

## 2016-04-20 DIAGNOSIS — K224 Dyskinesia of esophagus: Secondary | ICD-10-CM | POA: Insufficient documentation

## 2016-04-20 HISTORY — PX: ESOPHAGOGASTRODUODENOSCOPY (EGD) WITH PROPOFOL: SHX5813

## 2016-04-20 HISTORY — DX: Barrett's esophagus without dysplasia: K22.70

## 2016-04-20 SURGERY — ESOPHAGOGASTRODUODENOSCOPY (EGD) WITH PROPOFOL
Anesthesia: General

## 2016-04-20 MED ORDER — MIDAZOLAM HCL 2 MG/2ML IJ SOLN
INTRAMUSCULAR | Status: DC | PRN
  Administered 2016-04-20: 1 mg via INTRAVENOUS

## 2016-04-20 MED ORDER — LIDOCAINE HCL (PF) 2 % IJ SOLN
INTRAMUSCULAR | Status: DC | PRN
  Administered 2016-04-20: 40 mg via INTRADERMAL

## 2016-04-20 MED ORDER — GLYCOPYRROLATE 0.2 MG/ML IJ SOLN
INTRAMUSCULAR | Status: DC | PRN
  Administered 2016-04-20: .2 mg via INTRAVENOUS

## 2016-04-20 MED ORDER — PROPOFOL 500 MG/50ML IV EMUL
INTRAVENOUS | Status: AC
  Filled 2016-04-20: qty 50

## 2016-04-20 MED ORDER — PROPOFOL 500 MG/50ML IV EMUL
INTRAVENOUS | Status: DC | PRN
  Administered 2016-04-20: 150 ug/kg/min via INTRAVENOUS

## 2016-04-20 MED ORDER — GLYCOPYRROLATE 0.2 MG/ML IJ SOLN
INTRAMUSCULAR | Status: AC
  Filled 2016-04-20: qty 1

## 2016-04-20 MED ORDER — SODIUM CHLORIDE 0.9 % IV SOLN
INTRAVENOUS | Status: DC
  Administered 2016-04-20: 1000 mL via INTRAVENOUS

## 2016-04-20 MED ORDER — PROPOFOL 10 MG/ML IV BOLUS
INTRAVENOUS | Status: DC | PRN
  Administered 2016-04-20: 70 mg via INTRAVENOUS

## 2016-04-20 MED ORDER — FENTANYL CITRATE (PF) 100 MCG/2ML IJ SOLN
INTRAMUSCULAR | Status: AC
  Filled 2016-04-20: qty 2

## 2016-04-20 MED ORDER — LIDOCAINE HCL (PF) 2 % IJ SOLN
INTRAMUSCULAR | Status: AC
  Filled 2016-04-20: qty 2

## 2016-04-20 MED ORDER — FENTANYL CITRATE (PF) 100 MCG/2ML IJ SOLN
INTRAMUSCULAR | Status: DC | PRN
  Administered 2016-04-20: 50 ug via INTRAVENOUS

## 2016-04-20 MED ORDER — MIDAZOLAM HCL 2 MG/2ML IJ SOLN
INTRAMUSCULAR | Status: AC
Start: 2016-04-20 — End: 2016-04-20
  Filled 2016-04-20: qty 2

## 2016-04-20 MED ORDER — SODIUM CHLORIDE 0.9 % IV SOLN: INTRAVENOUS | Status: DC

## 2016-04-20 NOTE — Transfer of Care (Signed)
Immediate Anesthesia Transfer of Care Note  Patient: Susan Gregory  Procedure(s) Performed: Procedure(s): ESOPHAGOGASTRODUODENOSCOPY (EGD) WITH PROPOFOL (N/A)  Patient Location: PACU  Anesthesia Type:General  Level of Consciousness: awake, alert  and oriented  Airway & Oxygen Therapy: Patient Spontanous Breathing and Patient connected to nasal cannula oxygen  Post-op Assessment: Report given to RN and Post -op Vital signs reviewed and stable  Post vital signs: Reviewed and stable  Last Vitals:  Vitals:   04/20/16 1051 04/20/16 1133  BP: (!) 151/97 116/79  Pulse: 78 86  Resp: 20 18  Temp: (!) 35.8 C (!) 36.1 C    Last Pain:  Vitals:   04/20/16 1133  TempSrc: Tympanic         Complications: No apparent anesthesia complications

## 2016-04-20 NOTE — Anesthesia Procedure Notes (Signed)
Date/Time: 04/20/2016 11:10 AM Performed by: Karoline CaldwellSTARR, Mahari Strahm Pre-anesthesia Checklist: Patient identified, Emergency Drugs available, Suction available and Patient being monitored Oxygen Delivery Method: Nasal cannula

## 2016-04-20 NOTE — H&P (Addendum)
I have discussed the risks benefits and complications of procedures to include not limited to bleeding, infection, perforation and the risk of sedation and the patient wishes to proceed.Outpatient short stay form Pre-procedure 04/20/2016 11:08 AM Susan DeemMartin U Jessia Kief MD  Primary Physician: Dr. Elizabeth Sauereanna Jones  Reason for visit:  EGD  History of present illness:  Patient is a [age] year old female presenting today as above. Has personal history of Barrett's esophagus noted on biopsies 11/2013. She does take a daily 81 mg aspirin but denies any other aspirin products or blood thinning agents.    Current Facility-Administered Medications:  .  0.9 %  sodium chloride infusion, , Intravenous, Continuous, Susan DeemMartin U Azrael Maddix, MD, Last Rate: 20 mL/hr at 04/20/16 1101, 1,000 mL at 04/20/16 1101 .  0.9 %  sodium chloride infusion, , Intravenous, Continuous, Susan DeemMartin U Sadler Teschner, MD  Prescriptions Prior to Admission  Medication Sig Dispense Refill Last Dose  . albuterol (PROVENTIL HFA;VENTOLIN HFA) 108 (90 Base) MCG/ACT inhaler Inhale 2 puffs into the lungs every 6 (six) hours as needed for wheezing or shortness of breath. 1 Inhaler 11 04/20/2016 at 0600  . celecoxib (CELEBREX) 200 MG capsule Take 1 capsule (200 mg total) by mouth daily. 30 capsule 5 04/20/2016 at 0600  . cetirizine (ZYRTEC) 10 MG tablet Take 1 tablet by mouth as needed.   04/20/2016 at 0600  . griseofulvin (GRIFULVIN V) 500 MG tablet Take 500 mg by mouth 2 (two) times daily. Dr Cheree DittoGraham  5 04/20/2016 at 0600  . hydrochlorothiazide (MICROZIDE) 12.5 MG capsule TAKE ONE TABLET BY MOUTH EVERY DAY 30 capsule 0 04/20/2016 at 0600  . hydrochlorothiazide (MICROZIDE) 12.5 MG capsule TAKE ONE TABLET BY MOUTH EVERY DAY 30 capsule 0 04/20/2016 at 0600  . lisinopril (PRINIVIL,ZESTRIL) 10 MG tablet TAKE 1 TABLET (10 MG TOTAL) BY MOUTH DAILY. 30 tablet 0 04/20/2016 at 0600  . omeprazole (PRILOSEC) 20 MG capsule Take 1 capsule (20 mg total) by mouth 2 (two) times daily. GI  doctor 30 capsule 6 04/20/2016 at 0600  . simvastatin (ZOCOR) 20 MG tablet TAKE 1 TABLET (20 MG TOTAL) BY MOUTH DAILY. 30 tablet 2 04/20/2016 at 0600  . aspirin 81 MG tablet Take 1 tablet by mouth daily.   Taking  . clobetasol (TEMOVATE) 0.05 % external solution Apply 1 application topically 2 (two) times daily. Dr Cheree DittoGraham   Taking  . ketoconazole (NIZORAL) 2 % shampoo Apply 1 application topically as needed. Dr Cheree DittoGraham 120 mL 1 Taking  . ranitidine (ZANTAC) 300 MG capsule Take 1 capsule (300 mg total) by mouth daily. 30 capsule 6 Taking     No Known Allergies   Past Medical History:  Diagnosis Date  . Allergy   . Barrett's esophagus   . GERD (gastroesophageal reflux disease)   . Hyperlipidemia   . Hypertension     Review of systems:      Physical Exam    Heart and lungs: Regular rate and rhythm without rub or gallop, lungs are bilaterally clear.    HEENT: Normocephalic atraumatic eyes are anicteric    Other:     Pertinant exam for procedure: Soft nontender nondistended bowel sounds positive normoactive.    Planned proceedures: Colonoscopy and indicated procedures. I have discussed the risks benefits and complications of procedures to include not limited to bleeding, infection, perforation and the risk of sedation and the patient wishes to proceed.    Susan DeemMartin U Eliah Marquard, MD Gastroenterology 04/20/2016  11:08 AM

## 2016-04-20 NOTE — Anesthesia Preprocedure Evaluation (Signed)
Anesthesia Evaluation  Patient identified by MRN, date of birth, ID band Patient awake    Reviewed: Allergy & Precautions, H&P , NPO status , Patient's Chart, lab work & pertinent test results, reviewed documented beta blocker date and time   History of Anesthesia Complications Negative for: history of anesthetic complications  Airway Mallampati: III  TM Distance: >3 FB Neck ROM: full    Dental  (+) Caps, Missing   Pulmonary neg pulmonary ROS,           Cardiovascular Exercise Tolerance: Good hypertension, On Medications (-) angina(-) CAD, (-) Past MI, (-) Cardiac Stents and (-) CABG (-) dysrhythmias (-) Valvular Problems/Murmurs     Neuro/Psych negative neurological ROS  negative psych ROS   GI/Hepatic Neg liver ROS, GERD  ,  Endo/Other  negative endocrine ROS  Renal/GU negative Renal ROS  negative genitourinary   Musculoskeletal   Abdominal   Peds  Hematology negative hematology ROS (+)   Anesthesia Other Findings Past Medical History: No date: Allergy No date: Barrett's esophagus No date: GERD (gastroesophageal reflux disease) No date: Hyperlipidemia No date: Hypertension   Reproductive/Obstetrics negative OB ROS                             Anesthesia Physical Anesthesia Plan  ASA: II  Anesthesia Plan: General   Post-op Pain Management:    Induction:   Airway Management Planned:   Additional Equipment:   Intra-op Plan:   Post-operative Plan:   Informed Consent: I have reviewed the patients History and Physical, chart, labs and discussed the procedure including the risks, benefits and alternatives for the proposed anesthesia with the patient or authorized representative who has indicated his/her understanding and acceptance.   Dental Advisory Given  Plan Discussed with: Anesthesiologist, CRNA and Surgeon  Anesthesia Plan Comments:         Anesthesia Quick  Evaluation

## 2016-04-20 NOTE — Op Note (Signed)
[facility] Gastroenterology Patient Name: Susan Gregory Procedure Date: 04/20/2016 10:50 AM MRN: [id number] Account #: [id number] Date of Birth: 12-12-1960 Admit Type: Outpatient Age: 56 Room: [facility] ENDO ROOM 1 Gender: Female Note Status: Finalized Procedure:            Upper GI endoscopy Indications:          Follow-up of Barrett's esophagus Providers:            Christena Deem, MD Referring MD:         Duanne Limerick, MD (Referring MD) Medicines:            Monitored Anesthesia Care Complications:        No immediate complications. Procedure:            Pre-Anesthesia Assessment:                       - ASA Grade Assessment: II - A patient with mild                        systemic disease.                       After obtaining informed consent, the endoscope was                        passed under direct vision. Throughout the procedure,                        the patient's blood pressure, pulse, and oxygen                        saturations were monitored continuously. The Endoscope                        was introduced through the mouth, and advanced to the                        third part of duodenum. The upper GI endoscopy was                        accomplished without difficulty. The patient tolerated                        the procedure well. Findings:      There were esophageal mucosal changes suggestive of short-segment       Barrett's esophagus present at the gastroesophageal junction. Mucosa was       biopsied with a cold forceps for histology in 4 quadrants.      Diffuse and patchy moderate inflammation characterized by congestion       (edema), erosions, erythema and shallow ulcerations was found in the       gastric body and in the gastric antrum. Biopsies were taken with a cold       forceps for histology. Biopsies were taken with a cold forceps for       Helicobacter pylori testing.      The cardia and gastric fundus were normal on  retroflexion otherwise.      A few diffuse, small erosions without bleeding were found in the       duodenal bulb, in the second portion of the duodenum and  in the third       portion of the duodenum. Biopsies were taken with a cold forceps for       histology. Impression:           - Esophageal mucosal changes suggestive of                        short-segment Barrett's esophagus. Biopsied.                       - Erosive gastritis. Biopsied.                       - Erosive duodenopathy without bleeding. Biopsied. Recommendation:       - Repeat upper endoscopy in 8 weeks to check healing. Procedure Code(s):    --- Professional ---                       [id number], Esophagogastroduodenoscopy, flexible, transoral;                        with biopsy, single or multiple Diagnosis Code(s):    --- Professional ---                       K22.70, Barrett's esophagus without dysplasia                       K29.60, Other gastritis without bleeding                       K31.89, Other diseases of stomach and duodenum CPT copyright 2016 American Medical Association. All rights reserved. The codes documented in this report are preliminary and upon coder review may  be revised to meet current compliance requirements. Christena DeemMartin U Helder Crisafulli, MD 04/20/2016 11:36:07 AM This report has been signed electronically. Number of Addenda: 0 Note Initiated On: 04/20/2016 10:50 AM      [facility]

## 2016-04-20 NOTE — Anesthesia Post-op Follow-up Note (Cosign Needed)
Anesthesia QCDR form completed.        

## 2016-04-21 LAB — SURGICAL PATHOLOGY

## 2016-04-21 NOTE — Anesthesia Postprocedure Evaluation (Signed)
Anesthesia Post Note  Patient: Susan StarchRoberta R Gregory  Procedure(s) Performed: Procedure(s) (LRB): ESOPHAGOGASTRODUODENOSCOPY (EGD) WITH PROPOFOL (N/A)  Patient location during evaluation: Endoscopy Anesthesia Type: General Level of consciousness: awake and alert Pain management: pain level controlled Vital Signs Assessment: post-procedure vital signs reviewed and stable Respiratory status: spontaneous breathing, nonlabored ventilation, respiratory function stable and patient connected to nasal cannula oxygen Cardiovascular status: blood pressure returned to baseline and stable Postop Assessment: no signs of nausea or vomiting Anesthetic complications: no     Last Vitals:  Vitals:   04/20/16 1133 04/20/16 1143  BP: 116/79 117/78  Pulse: 85 76  Resp: 17 15  Temp: (!) 36.1 C     Last Pain:  Vitals:   04/21/16 0742  TempSrc:   PainSc: 0-No pain                 Lenard SimmerAndrew Daijon Wenke

## 2016-05-17 ENCOUNTER — Other Ambulatory Visit: Payer: Self-pay | Admitting: Family Medicine

## 2016-05-17 DIAGNOSIS — I10 Essential (primary) hypertension: Secondary | ICD-10-CM

## 2016-05-19 ENCOUNTER — Ambulatory Visit (INDEPENDENT_AMBULATORY_CARE_PROVIDER_SITE_OTHER): Payer: BLUE CROSS/BLUE SHIELD | Admitting: Family Medicine

## 2016-05-19 ENCOUNTER — Encounter: Payer: Self-pay | Admitting: Family Medicine

## 2016-05-19 VITALS — BP 128/90 | HR 64 | Ht 64.0 in | Wt 203.0 lb

## 2016-05-19 DIAGNOSIS — I872 Venous insufficiency (chronic) (peripheral): Secondary | ICD-10-CM

## 2016-05-19 DIAGNOSIS — E7849 Other hyperlipidemia: Secondary | ICD-10-CM

## 2016-05-19 DIAGNOSIS — E784 Other hyperlipidemia: Secondary | ICD-10-CM

## 2016-05-19 DIAGNOSIS — K219 Gastro-esophageal reflux disease without esophagitis: Secondary | ICD-10-CM

## 2016-05-19 DIAGNOSIS — I10 Essential (primary) hypertension: Secondary | ICD-10-CM

## 2016-05-19 MED ORDER — HYDROCHLOROTHIAZIDE 12.5 MG PO CAPS: ORAL_CAPSULE | ORAL | 2 refills | Status: DC

## 2016-05-19 MED ORDER — SIMVASTATIN 20 MG PO TABS: 20.0000 mg | ORAL_TABLET | Freq: Every day | ORAL | 2 refills | Status: DC

## 2016-05-19 MED ORDER — LOSARTAN POTASSIUM 50 MG PO TABS: 50.0000 mg | ORAL_TABLET | Freq: Every day | ORAL | 2 refills | Status: DC

## 2016-05-19 NOTE — Progress Notes (Signed)
Name: Susan Gregory   MRN: [id number]    DOB: 1961-01-02   Date:05/19/2016       Progress Note  Subjective  Chief Complaint  Chief Complaint  Patient presents with  . Hypertension  . Hyperlipidemia    Hypertension  This is a chronic problem. The current episode started more than 1 year ago. The problem has been gradually improving since onset. The problem is controlled. Pertinent negatives include no anxiety, blurred vision, chest pain, headaches, malaise/fatigue, neck pain, orthopnea, palpitations, peripheral edema, PND, shortness of breath or sweats. There are no associated agents to hypertension. There are no known risk factors for coronary artery disease. Past treatments include ACE inhibitors and diuretics. The current treatment provides moderate improvement. There is no history of angina, kidney disease, CAD/MI, CVA, heart failure, left ventricular hypertrophy, PVD or retinopathy. There is no history of chronic renal disease, a hypertension causing med or renovascular disease.  Hyperlipidemia  This is a chronic problem. The current episode started more than 1 year ago. The problem is controlled. Recent lipid tests were reviewed and are normal. She has no history of chronic renal disease, diabetes, hypothyroidism, liver disease, obesity or nephrotic syndrome. Factors aggravating her hyperlipidemia include thiazides. Pertinent negatives include no chest pain, focal sensory loss, focal weakness, leg pain, myalgias or shortness of breath. She is currently on no antihyperlipidemic treatment. The current treatment provides moderate improvement of lipids. There are no compliance problems.  Risk factors for coronary artery disease include hypertension and post-menopausal.    No problem-specific Assessment & Plan notes found for this encounter.   Past Medical History:  Diagnosis Date  . Allergy   . Barrett's esophagus   . GERD (gastroesophageal reflux disease)   . Hyperlipidemia   .  Hypertension     Past Surgical History:  Procedure Laterality Date  . ESOPHAGOGASTRODUODENOSCOPY (EGD) WITH PROPOFOL N/A 04/20/2016   Procedure: ESOPHAGOGASTRODUODENOSCOPY (EGD) WITH PROPOFOL;  Surgeon: Christena Deem, MD;  Location: [facility] ENDOSCOPY;  Service: Endoscopy;  Laterality: N/A;  . THROAT SURGERY     nodules taken off throat  . TUBAL LIGATION      History reviewed. No pertinent family history.  Social History   Social History  . Marital status: Married    Spouse name: N/A  . Number of children: N/A  . Years of education: N/A   Occupational History  . Not on file.   Social History Main Topics  . Smoking status: Never Smoker  . Smokeless tobacco: Never Used  . Alcohol use No  . Drug use: No  . Sexual activity: Not Currently   Other Topics Concern  . Not on file   Social History Narrative  . No narrative on file    No Known Allergies  Outpatient Medications Prior to Visit  Medication Sig Dispense Refill  . albuterol (PROVENTIL HFA;VENTOLIN HFA) 108 (90 Base) MCG/ACT inhaler Inhale 2 puffs into the lungs every 6 (six) hours as needed for wheezing or shortness of breath. 1 Inhaler 11  . aspirin 81 MG tablet Take 1 tablet by mouth daily.    . celecoxib (CELEBREX) 200 MG capsule Take 1 capsule (200 mg total) by mouth daily. 30 capsule 5  . clobetasol (TEMOVATE) 0.05 % external solution Apply 1 application topically 2 (two) times daily. Dr Cheree Ditto    . griseofulvin (GRIFULVIN V) 500 MG tablet Take 500 mg by mouth 2 (two) times daily. Dr Cheree Ditto  5  . ketoconazole (NIZORAL) 2 % shampoo Apply 1 application  topically as needed. Dr Cheree Ditto 120 mL 1  . hydrochlorothiazide (MICROZIDE) 12.5 MG capsule TAKE 1 CAPSULE BY MOUTH DAILY **NEEDS APPT** 30 capsule 0  . lisinopril (PRINIVIL,ZESTRIL) 10 MG tablet TAKE 1 TABLET (10 MG TOTAL) BY MOUTH DAILY. 30 tablet 0  . simvastatin (ZOCOR) 20 MG tablet TAKE 1 TABLET (20 MG TOTAL) BY MOUTH DAILY. 30 tablet 2  . cetirizine (ZYRTEC)  10 MG tablet Take 1 tablet by mouth as needed.    . hydrochlorothiazide (MICROZIDE) 12.5 MG capsule TAKE ONE TABLET BY MOUTH EVERY DAY 30 capsule 0  . omeprazole (PRILOSEC) 20 MG capsule Take 1 capsule (20 mg total) by mouth 2 (two) times daily. GI doctor 30 capsule 6  . ranitidine (ZANTAC) 300 MG capsule Take 1 capsule (300 mg total) by mouth daily. 30 capsule 6   No facility-administered medications prior to visit.     Review of Systems  Constitutional: Negative for chills, fever, malaise/fatigue and weight loss.  HENT: Negative for ear discharge, ear pain and sore throat.   Eyes: Negative for blurred vision.  Respiratory: Positive for cough. Negative for sputum production, shortness of breath and wheezing.   Cardiovascular: Negative for chest pain, palpitations, orthopnea, leg swelling and PND.  Gastrointestinal: Negative for abdominal pain, blood in stool, constipation, diarrhea, heartburn, melena and nausea.  Genitourinary: Negative for dysuria, frequency, hematuria and urgency.  Musculoskeletal: Negative for back pain, joint pain, myalgias and neck pain.  Skin: Negative for rash.  Neurological: Negative for dizziness, tingling, sensory change, focal weakness and headaches.  Endo/Heme/Allergies: Negative for environmental allergies and polydipsia. Does not bruise/bleed easily.  Psychiatric/Behavioral: Negative for depression and suicidal ideas. The patient is not nervous/anxious and does not have insomnia.      Objective  Vitals:   05/19/16 1054  BP: 128/90  Pulse: 64  Weight: 203 lb (92.1 kg)  Height:  (1.626 m)    Physical Exam  Constitutional: She is well-developed, well-nourished, and in no distress. No distress.  HENT:  Head: Normocephalic and atraumatic.  Right Ear: External ear normal.  Left Ear: External ear normal.  Nose: Nose normal.  Mouth/Throat: Oropharynx is clear and moist.  Eyes: Conjunctivae and EOM are normal. Pupils are equal, round, and reactive  to light. Right eye exhibits no discharge. Left eye exhibits no discharge.  Neck: Normal range of motion. Neck supple. No JVD present. No thyromegaly present.  Cardiovascular: Normal rate, regular rhythm, normal heart sounds and intact distal pulses.  Exam reveals no gallop and no friction rub.   No murmur heard. Pulmonary/Chest: Effort normal and breath sounds normal. She has no wheezes. She has no rales.  Abdominal: Soft. Bowel sounds are normal. She exhibits no mass. There is no tenderness. There is no guarding.  Musculoskeletal: Normal range of motion. She exhibits no edema.  Lymphadenopathy:    She has no cervical adenopathy.  Neurological: She is alert. She has normal reflexes.  Skin: Skin is warm and dry. She is not diaphoretic.  Psychiatric: Mood and affect normal.  Nursing note and vitals reviewed.     Assessment & Plan  Problem List Items Addressed This Visit      Cardiovascular and Mediastinum   Benign hypertension   Relevant Medications   hydrochlorothiazide (MICROZIDE) 12.5 MG capsule   simvastatin (ZOCOR) 20 MG tablet   losartan (COZAAR) 50 MG tablet     Digestive   Acid reflux - Primary   Relevant Medications   pantoprazole (PROTONIX) 40 MG tablet  Other   Familial multiple lipoprotein-type hyperlipidemia   Relevant Medications   hydrochlorothiazide (MICROZIDE) 12.5 MG capsule   simvastatin (ZOCOR) 20 MG tablet   losartan (COZAAR) 50 MG tablet    Other Visit Diagnoses    Venous insufficiency       Relevant Medications   hydrochlorothiazide (MICROZIDE) 12.5 MG capsule   simvastatin (ZOCOR) 20 MG tablet   losartan (COZAAR) 50 MG tablet      Meds ordered this encounter  Medications  . hydrochlorothiazide (MICROZIDE) 12.5 MG capsule    Sig: TAKE 1 CAPSULE BY MOUTH DAILY    Dispense:  90 capsule    Refill:  2    sched appt  . simvastatin (ZOCOR) 20 MG tablet    Sig: Take 1 tablet (20 mg total) by mouth daily.    Dispense:  90 tablet    Refill:   2    sched appt  . losartan (COZAAR) 50 MG tablet    Sig: Take 1 tablet (50 mg total) by mouth daily.    Dispense:  90 tablet    Refill:  2      Dr. Elizabeth Sauer [facility]  05/19/16

## 2016-06-15 ENCOUNTER — Encounter: Payer: Self-pay | Admitting: Family Medicine

## 2016-06-15 ENCOUNTER — Other Ambulatory Visit: Payer: Self-pay

## 2016-06-15 ENCOUNTER — Ambulatory Visit (INDEPENDENT_AMBULATORY_CARE_PROVIDER_SITE_OTHER): Payer: BLUE CROSS/BLUE SHIELD | Admitting: Family Medicine

## 2016-06-15 VITALS — BP 130/70 | HR 64 | Ht 64.0 in | Wt 203.0 lb

## 2016-06-15 DIAGNOSIS — L219 Seborrheic dermatitis, unspecified: Secondary | ICD-10-CM | POA: Diagnosis not present

## 2016-06-15 MED ORDER — HYDROCORTISONE 1 % EX LOTN: 1.0000 "application " | TOPICAL_LOTION | Freq: Two times a day (BID) | CUTANEOUS | 0 refills | Status: DC

## 2016-06-15 MED ORDER — HYDROCORTISONE 2.5 % EX LOTN: TOPICAL_LOTION | Freq: Two times a day (BID) | CUTANEOUS | 0 refills | Status: DC

## 2016-06-15 MED ORDER — PREDNISONE 10 MG PO TABS: 10.0000 mg | ORAL_TABLET | Freq: Every day | ORAL | 0 refills | Status: DC

## 2016-06-15 NOTE — Patient Instructions (Signed)
Seborrheic Dermatitis, Adult Seborrheic dermatitis is a skin disease that causes red, scaly patches. It usually occurs on the scalp, and it is often called dandruff. The patches may appear on other parts of the body. Skin patches tend to appear where there are many oil glands in the skin. Areas of the body that are commonly affected include:  Scalp.  Skin folds of the body.  Ears.  Eyebrows.  Neck.  Face.  Armpits.  The bearded area of men's faces. The condition may come and go for no known reason, and it is often long-lasting (chronic). What are the causes? The cause of this condition is not known. What increases the risk? This condition is more likely to develop in people who:  Have certain conditions, such as:  HIV (human immunodeficiency virus).  AIDS (acquired immunodeficiency syndrome).  Parkinson disease.  Mood disorders, such as depression.  Are 40-60 years old. What are the signs or symptoms? Symptoms of this condition include:  Thick scales on the scalp.  Redness on the face or in the armpits.  Skin that is flaky. The flakes may be white or yellow.  Skin that seems oily or dry but is not helped with moisturizers.  Itching or burning in the affected areas. How is this diagnosed? This condition is diagnosed with a medical history and physical exam. A sample of your skin may be tested (skin biopsy). You may need to see a skin specialist (dermatologist). How is this treated? There is no cure for this condition, but treatment can help to manage the symptoms. You may get treatment to remove scales, lower the risk of skin infection, and reduce swelling or itching. Treatment may include:  Creams that reduce swelling and irritation (steroids).  Creams that reduce skin yeast.  Medicated shampoo, soaps, moisturizing creams, or ointments.  Medicated moisturizing creams or ointments. Follow these instructions at home:  Apply over-the-counter and prescription  medicines only as told by your health care provider.  Use any medicated shampoo, soaps, skin creams, or ointments only as told by your health care provider.  Keep all follow-up visits as told by your health care provider. This is important. Contact a health care provider if:  Your symptoms do not improve with treatment.  Your symptoms get worse.  You have new symptoms. This information is not intended to replace advice given to you by your health care provider. Make sure you discuss any questions you have with your health care provider. Document Released: 01/26/2005 Document Revised: 08/16/2015 Document Reviewed: 05/16/2015 Elsevier Interactive Patient Education  2017 Elsevier Inc.  

## 2016-06-15 NOTE — Progress Notes (Signed)
Name: Susan StarchRoberta R Bigley   MRN: [id number]    DOB: April 16, 1960   Date:06/15/2016       Progress Note  Subjective  Chief Complaint  Chief Complaint  Patient presents with  . Rash    neck- redness- no bumps- face has "dried up but neck is not clearing up"    Rash  This is a recurrent problem. The current episode started in the past 7 days. The problem has been gradually worsening since onset. The affected locations include the face, neck and chest. The rash is characterized by itchiness, redness and swelling. She was exposed to nothing. Pertinent negatives include no congestion, cough, diarrhea, fatigue, fever, joint pain, shortness of breath or sore throat. Treatments tried: ketoconazole. The treatment provided moderate relief. Her past medical history is significant for allergies.    No problem-specific Assessment & Plan notes found for this encounter.   Past Medical History:  Diagnosis Date  . Allergy   . Barrett's esophagus   . GERD (gastroesophageal reflux disease)   . Hyperlipidemia   . Hypertension     Past Surgical History:  Procedure Laterality Date  . ESOPHAGOGASTRODUODENOSCOPY (EGD) WITH PROPOFOL N/A 04/20/2016   Procedure: ESOPHAGOGASTRODUODENOSCOPY (EGD) WITH PROPOFOL;  Surgeon: Christena DeemMartin U Skulskie, MD;  [facility];  Service: Endoscopy;  Laterality: N/A;  . THROAT SURGERY     nodules taken off throat  . TUBAL LIGATION      No family history on file.  Social History   Social History  . Marital status: Married    Spouse name: N/A  . Number of children: N/A  . Years of education: N/A   Occupational History  . Not on file.   Social History Main Topics  . Smoking status: Never Smoker  . Smokeless tobacco: Never Used  . Alcohol use No  . Drug use: No  . Sexual activity: Not Currently   Other Topics Concern  . Not on file   Social History Narrative  . No narrative on file    No Known Allergies  Outpatient Medications Prior to Visit  Medication  Sig Dispense Refill  . albuterol (PROVENTIL HFA;VENTOLIN HFA) 108 (90 Base) MCG/ACT inhaler Inhale 2 puffs into the lungs every 6 (six) hours as needed for wheezing or shortness of breath. 1 Inhaler 11  . aspirin 81 MG tablet Take 1 tablet by mouth daily.    . celecoxib (CELEBREX) 200 MG capsule Take 1 capsule (200 mg total) by mouth daily. 30 capsule 5  . clobetasol (TEMOVATE) 0.05 % external solution Apply 1 application topically 2 (two) times daily. Dr Cheree DittoGraham    . griseofulvin (GRIFULVIN V) 500 MG tablet Take 500 mg by mouth 2 (two) times daily. Dr Cheree DittoGraham  5  . hydrochlorothiazide (MICROZIDE) 12.5 MG capsule TAKE 1 CAPSULE BY MOUTH DAILY 90 capsule 2  . ketoconazole (NIZORAL) 2 % shampoo Apply 1 application topically as needed. Dr Cheree DittoGraham 120 mL 1  . losartan (COZAAR) 50 MG tablet Take 1 tablet (50 mg total) by mouth daily. 90 tablet 2  . pantoprazole (PROTONIX) 40 MG tablet Take 1 tablet by mouth daily. Dr Marva PandaSkulskie    . simvastatin (ZOCOR) 20 MG tablet Take 1 tablet (20 mg total) by mouth daily. 90 tablet 2   No facility-administered medications prior to visit.     Review of Systems  Constitutional: Negative for chills, fatigue, fever, malaise/fatigue and weight loss.  HENT: Negative for congestion, ear discharge, ear pain and sore throat.   Eyes: Negative  for blurred vision.  Respiratory: Negative for cough, sputum production, shortness of breath and wheezing.   Cardiovascular: Negative for chest pain, palpitations and leg swelling.  Gastrointestinal: Negative for abdominal pain, blood in stool, constipation, diarrhea, heartburn, melena and nausea.  Genitourinary: Negative for dysuria, frequency, hematuria and urgency.  Musculoskeletal: Negative for back pain, joint pain, myalgias and neck pain.  Skin: Positive for rash.  Neurological: Negative for dizziness, tingling, sensory change, focal weakness and headaches.  Endo/Heme/Allergies: Negative for environmental allergies and  polydipsia. Does not bruise/bleed easily.  Psychiatric/Behavioral: Negative for depression and suicidal ideas. The patient is not nervous/anxious and does not have insomnia.      Objective  Vitals:   06/15/16 1054  BP: 130/70  Pulse: 64  Weight: 203 lb (92.1 kg)  Height: 5\' 4"  (1.626 m)    Physical Exam  Constitutional: She is well-developed, well-nourished, and in no distress. No distress.  HENT:  Head: Normocephalic and atraumatic.  Right Ear: External ear normal.  Left Ear: External ear normal.  Nose: Nose normal.  Mouth/Throat: Oropharynx is clear and moist.  Eyes: Conjunctivae and EOM are normal. Pupils are equal, round, and reactive to light. Right eye exhibits no discharge. Left eye exhibits no discharge.  Neck: Normal range of motion. Neck supple. No JVD present. No thyromegaly present.  Cardiovascular: Normal rate, regular rhythm, normal heart sounds and intact distal pulses.  Exam reveals no gallop and no friction rub.   No murmur heard. Pulmonary/Chest: Effort normal and breath sounds normal. She has no wheezes. She has no rales.  Abdominal: Soft. Bowel sounds are normal. She exhibits no mass. There is no tenderness. There is no guarding.  Musculoskeletal: Normal range of motion. She exhibits no edema.  Lymphadenopathy:    She has no cervical adenopathy.  Neurological: She is alert. She has normal reflexes.  Skin: Skin is warm and dry. Rash noted. She is not diaphoretic. There is erythema.  Psychiatric: Mood and affect normal.  Nursing note and vitals reviewed.     Assessment & Plan  Problem List Items Addressed This Visit    None    Visit Diagnoses    Seborrheic dermatitis    -  Primary   ketoconazole evening   Relevant Medications   hydrocortisone 1 % lotion   predniSONE (DELTASONE) 10 MG tablet      Meds ordered this encounter  Medications  . hydrocortisone 1 % lotion    Sig: Apply 1 application topically 2 (two) times daily.    Dispense:  118  mL    Refill:  0  . predniSONE (DELTASONE) 10 MG tablet    Sig: Take 1 tablet (10 mg total) by mouth daily with breakfast.    Dispense:  14 tablet    Refill:  0      Dr. Hayden [facility]  06/15/16

## 2016-06-17 ENCOUNTER — Other Ambulatory Visit: Payer: Self-pay

## 2016-06-19 ENCOUNTER — Other Ambulatory Visit: Payer: Self-pay

## 2016-07-18 ENCOUNTER — Other Ambulatory Visit: Payer: Self-pay | Admitting: Family Medicine

## 2016-07-18 DIAGNOSIS — M1712 Unilateral primary osteoarthritis, left knee: Secondary | ICD-10-CM

## 2016-07-18 DIAGNOSIS — M79662 Pain in left lower leg: Secondary | ICD-10-CM

## 2016-07-31 ENCOUNTER — Ambulatory Visit (INDEPENDENT_AMBULATORY_CARE_PROVIDER_SITE_OTHER): Payer: BLUE CROSS/BLUE SHIELD | Admitting: Family Medicine

## 2016-07-31 ENCOUNTER — Encounter: Payer: Self-pay | Admitting: Family Medicine

## 2016-07-31 VITALS — BP 132/80 | HR 68 | Ht 64.0 in | Wt 206.0 lb

## 2016-07-31 DIAGNOSIS — Z1231 Encounter for screening mammogram for malignant neoplasm of breast: Secondary | ICD-10-CM | POA: Diagnosis not present

## 2016-07-31 DIAGNOSIS — Z23 Encounter for immunization: Secondary | ICD-10-CM | POA: Diagnosis not present

## 2016-07-31 DIAGNOSIS — Z6835 Body mass index (BMI) 35.0-35.9, adult: Secondary | ICD-10-CM

## 2016-07-31 DIAGNOSIS — Z124 Encounter for screening for malignant neoplasm of cervix: Secondary | ICD-10-CM | POA: Diagnosis not present

## 2016-07-31 DIAGNOSIS — Z0000 Encounter for general adult medical examination without abnormal findings: Secondary | ICD-10-CM

## 2016-07-31 DIAGNOSIS — E7849 Other hyperlipidemia: Secondary | ICD-10-CM

## 2016-07-31 DIAGNOSIS — R928 Other abnormal and inconclusive findings on diagnostic imaging of breast: Secondary | ICD-10-CM | POA: Diagnosis not present

## 2016-07-31 DIAGNOSIS — E784 Other hyperlipidemia: Secondary | ICD-10-CM | POA: Diagnosis not present

## 2016-07-31 DIAGNOSIS — E669 Obesity, unspecified: Secondary | ICD-10-CM

## 2016-07-31 DIAGNOSIS — I10 Essential (primary) hypertension: Secondary | ICD-10-CM

## 2016-07-31 DIAGNOSIS — Z1211 Encounter for screening for malignant neoplasm of colon: Secondary | ICD-10-CM

## 2016-07-31 DIAGNOSIS — Z1239 Encounter for other screening for malignant neoplasm of breast: Secondary | ICD-10-CM

## 2016-07-31 LAB — HEMOCCULT GUIAC POC 1CARD (OFFICE): FECAL OCCULT BLD: NEGATIVE

## 2016-07-31 NOTE — Addendum Note (Signed)
Addended by: Everitt AmberLYNCH, Coby Antrobus L on: 07/31/2016 03:04 PM   Modules accepted: Orders

## 2016-07-31 NOTE — Progress Notes (Signed)
Name: Susan Gregory   MRN: [id number]    DOB: [birth year]   Date:07/31/2016       Progress Note  Subjective  Chief Complaint  Chief Complaint  Patient presents with  . Annual Exam    needs pap, mammo, and TDAP    Patient presents for annual physical exam.    No problem-specific Assessment & Plan notes found for this encounter.   Past Medical History:  Diagnosis Date  . Allergy   . Barrett's esophagus   . GERD (gastroesophageal reflux disease)   . Hyperlipidemia   . Hypertension     Past Surgical History:  Procedure Laterality Date  . ESOPHAGOGASTRODUODENOSCOPY (EGD) WITH PROPOFOL N/A 04/20/2016   Procedure: ESOPHAGOGASTRODUODENOSCOPY (EGD) WITH PROPOFOL;  Surgeon: Christena Deem, MD;  Location: [facility] ENDOSCOPY;  Service: Endoscopy;  Laterality: N/A;  . THROAT SURGERY     nodules taken off throat  . TUBAL LIGATION      No family history on file.  Social History   Social History  . Marital status: Married    Spouse name: N/A  . Number of children: N/A  . Years of education: N/A   Occupational History  . Not on file.   Social History Main Topics  . Smoking status: Never Smoker  . Smokeless tobacco: Never Used  . Alcohol use No  . Drug use: No  . Sexual activity: Not Currently   Other Topics Concern  . Not on file   Social History Narrative  . No narrative on file    No Known Allergies  Outpatient Medications Prior to Visit  Medication Sig Dispense Refill  . albuterol (PROVENTIL HFA;VENTOLIN HFA) 108 (90 Base) MCG/ACT inhaler Inhale 2 puffs into the lungs every 6 (six) hours as needed for wheezing or shortness of breath. 1 Inhaler 11  . aspirin 81 MG tablet Take 1 tablet by mouth daily.    . celecoxib (CELEBREX) 200 MG capsule TAKE 1 CAPSULE (200 MG TOTAL) BY MOUTH DAILY. 30 capsule 0  . clobetasol (TEMOVATE) 0.05 % external solution Apply 1 application topically 2 (two) times daily. Dr Cheree Ditto    . griseofulvin (GRIFULVIN V) 500 MG tablet Take 500 mg  by mouth 2 (two) times daily. Dr Cheree Ditto  5  . hydrochlorothiazide (MICROZIDE) 12.5 MG capsule TAKE 1 CAPSULE BY MOUTH DAILY 90 capsule 2  . ketoconazole (NIZORAL) 2 % shampoo Apply 1 application topically as needed. Dr Cheree Ditto 120 mL 1  . losartan (COZAAR) 50 MG tablet Take 1 tablet (50 mg total) by mouth daily. 90 tablet 2  . pantoprazole (PROTONIX) 40 MG tablet Take 1 tablet by mouth daily. Dr Marva Panda    . simvastatin (ZOCOR) 20 MG tablet Take 1 tablet (20 mg total) by mouth daily. 90 tablet 2  . hydrocortisone 1 % lotion Apply 1 application topically 2 (two) times daily. 118 mL 0  . hydrocortisone 2.5 % lotion Apply topically 2 (two) times daily. 59 mL 0  . predniSONE (DELTASONE) 10 MG tablet Take 1 tablet (10 mg total) by mouth daily with breakfast. 14 tablet 0   No facility-administered medications prior to visit.     Review of Systems  Constitutional: Negative for chills, fever, malaise/fatigue and weight loss.  HENT: Negative for ear discharge, ear pain and sore throat.   Eyes: Negative for blurred vision.  Respiratory: Negative for cough, sputum production, shortness of breath and wheezing.   Cardiovascular: Negative for chest pain, palpitations and leg swelling.  Gastrointestinal: Negative for abdominal  pain, blood in stool, constipation, diarrhea, heartburn, melena and nausea.  Genitourinary: Negative for dysuria, frequency, hematuria and urgency.  Musculoskeletal: Negative for back pain, joint pain, myalgias and neck pain.  Skin: Negative for rash.  Neurological: Negative for dizziness, tingling, sensory change, focal weakness and headaches.  Endo/Heme/Allergies: Negative for environmental allergies and polydipsia. Does not bruise/bleed easily.  Psychiatric/Behavioral: Negative for depression and suicidal ideas. The patient is not nervous/anxious and does not have insomnia.      Objective  Vitals:   07/31/16 0843  BP: 132/80  Pulse: 68  Weight: 206 lb (93.4 kg)  Height:  5\' 4"  (1.626 m)    Physical Exam  Constitutional: She is well-developed, well-nourished, and in no distress. No distress.  HENT:  Head: Normocephalic and atraumatic.  Right Ear: External ear normal.  Left Ear: External ear normal.  Nose: Nose normal.  Mouth/Throat: Oropharynx is clear and moist.  Eyes: Conjunctivae and EOM are normal. Pupils are equal, round, and reactive to light. Right eye exhibits no discharge. Left eye exhibits no discharge.  Neck: Normal range of motion. Neck supple. No JVD present. No thyromegaly present.  Cardiovascular: Normal rate, regular rhythm, normal heart sounds and intact distal pulses.  Exam reveals no gallop and no friction rub.   No murmur heard. Pulmonary/Chest: Effort normal and breath sounds normal. She has no wheezes. She has no rales. She exhibits no tenderness. Right breast exhibits no inverted nipple, no mass, no nipple discharge, no skin change and no tenderness. Left breast exhibits no inverted nipple, no mass, no nipple discharge, no skin change and no tenderness. Breasts are symmetrical.  Abdominal: Soft. Normal aorta and bowel sounds are normal. She exhibits no mass. There is no hepatosplenomegaly. There is no tenderness. There is no rebound, no guarding and no CVA tenderness.  Genitourinary: Vagina normal, uterus normal, cervix normal, right adnexa normal and left adnexa normal. Rectal exam shows guaiac negative stool.  Musculoskeletal: Normal range of motion. She exhibits no edema.  Lymphadenopathy:    She has no cervical adenopathy.  Neurological: She is alert. She has normal reflexes.  Skin: Skin is warm and dry. She is not diaphoretic.  Psychiatric: Mood and affect normal.  Nursing note and vitals reviewed.     Assessment & Plan  Problem List Items Addressed This Visit      Cardiovascular and Mediastinum   Benign hypertension     Other   Familial multiple lipoprotein-type hyperlipidemia    Other Visit Diagnoses    Annual  physical exam    -  Primary   Relevant Orders   Pap IG and HPV (high risk) DNA detection   BMI 35.0-35.9,adult       Need for diphtheria-tetanus-pertussis (Tdap) vaccine       Relevant Orders   Tdap vaccine greater than or equal to 7yo IM (Completed)   Obesity (BMI 35.0-39.9 without comorbidity)       Breast cancer screening       Relevant Orders   MM Digital Screening   Cervical cancer screening       Relevant Orders   Pap IG and HPV (high risk) DNA detection   Colon cancer screening       Relevant Orders   POCT Occult Blood Stool (Completed)      No orders of the defined types were placed in this encounter.     Dr. Hayden Rasmusseneanna Alayia Meggison [facility]  07/31/16

## 2016-08-03 ENCOUNTER — Other Ambulatory Visit: Payer: Self-pay

## 2016-08-03 DIAGNOSIS — R928 Other abnormal and inconclusive findings on diagnostic imaging of breast: Secondary | ICD-10-CM

## 2016-08-03 DIAGNOSIS — Z1239 Encounter for other screening for malignant neoplasm of breast: Secondary | ICD-10-CM

## 2016-08-05 LAB — PAP IG AND HPV HIGH-RISK
HPV, high-risk: NEGATIVE
PAP SMEAR COMMENT: 0

## 2016-08-18 ENCOUNTER — Other Ambulatory Visit: Payer: Self-pay | Admitting: Family Medicine

## 2016-08-18 DIAGNOSIS — M79662 Pain in left lower leg: Secondary | ICD-10-CM

## 2016-08-18 DIAGNOSIS — M1712 Unilateral primary osteoarthritis, left knee: Secondary | ICD-10-CM

## 2016-08-28 ENCOUNTER — Other Ambulatory Visit: Payer: Self-pay | Admitting: Family Medicine

## 2016-08-28 ENCOUNTER — Ambulatory Visit
Admission: RE | Admit: 2016-08-28 | Discharge: 2016-08-28 | Disposition: A | Discharge status: Home / Self Care | Payer: BLUE CROSS/BLUE SHIELD | Source: Ambulatory Visit | Attending: Family Medicine | Admitting: Family Medicine

## 2016-08-28 DIAGNOSIS — R928 Other abnormal and inconclusive findings on diagnostic imaging of breast: Secondary | ICD-10-CM | POA: Diagnosis present

## 2016-08-28 DIAGNOSIS — R921 Mammographic calcification found on diagnostic imaging of breast: Secondary | ICD-10-CM | POA: Diagnosis not present

## 2016-08-28 DIAGNOSIS — Z1239 Encounter for other screening for malignant neoplasm of breast: Secondary | ICD-10-CM

## 2016-08-28 DIAGNOSIS — N6310 Unspecified lump in the right breast, unspecified quadrant: Secondary | ICD-10-CM

## 2016-09-04 ENCOUNTER — Ambulatory Visit
Admission: RE | Admit: 2016-09-04 | Discharge: 2016-09-04 | Disposition: A | Discharge status: Home / Self Care | Payer: BLUE CROSS/BLUE SHIELD | Source: Ambulatory Visit | Attending: Family Medicine | Admitting: Family Medicine

## 2016-09-04 ENCOUNTER — Other Ambulatory Visit: Payer: Self-pay | Admitting: Family Medicine

## 2016-09-04 DIAGNOSIS — N6310 Unspecified lump in the right breast, unspecified quadrant: Secondary | ICD-10-CM | POA: Diagnosis not present

## 2016-09-04 DIAGNOSIS — R928 Other abnormal and inconclusive findings on diagnostic imaging of breast: Secondary | ICD-10-CM

## 2016-09-04 DIAGNOSIS — N62 Hypertrophy of breast: Secondary | ICD-10-CM | POA: Diagnosis not present

## 2016-09-04 HISTORY — PX: BREAST BIOPSY: SHX20

## 2016-09-07 ENCOUNTER — Ambulatory Visit
Admission: RE | Admit: 2016-09-07 | Discharge: 2016-09-07 | Disposition: A | Discharge status: Home / Self Care | Payer: BLUE CROSS/BLUE SHIELD | Source: Ambulatory Visit | Attending: Family Medicine | Admitting: Family Medicine

## 2016-09-07 DIAGNOSIS — R928 Other abnormal and inconclusive findings on diagnostic imaging of breast: Secondary | ICD-10-CM

## 2016-09-07 DIAGNOSIS — N6311 Unspecified lump in the right breast, upper outer quadrant: Secondary | ICD-10-CM | POA: Insufficient documentation

## 2016-09-07 DIAGNOSIS — N62 Hypertrophy of breast: Secondary | ICD-10-CM | POA: Insufficient documentation

## 2016-09-07 DIAGNOSIS — N6310 Unspecified lump in the right breast, unspecified quadrant: Secondary | ICD-10-CM

## 2016-09-07 HISTORY — PX: BREAST BIOPSY: SHX20

## 2016-09-07 LAB — SURGICAL PATHOLOGY

## 2016-09-08 LAB — SURGICAL PATHOLOGY

## 2016-09-09 ENCOUNTER — Other Ambulatory Visit: Payer: Self-pay | Admitting: Family Medicine

## 2016-09-09 DIAGNOSIS — J4521 Mild intermittent asthma with (acute) exacerbation: Secondary | ICD-10-CM

## 2016-09-16 ENCOUNTER — Other Ambulatory Visit: Payer: Self-pay | Admitting: Family Medicine

## 2016-09-16 DIAGNOSIS — M79662 Pain in left lower leg: Secondary | ICD-10-CM

## 2016-09-16 DIAGNOSIS — M1712 Unilateral primary osteoarthritis, left knee: Secondary | ICD-10-CM

## 2016-12-28 ENCOUNTER — Ambulatory Visit (INDEPENDENT_AMBULATORY_CARE_PROVIDER_SITE_OTHER): Payer: 59 | Admitting: Family Medicine

## 2016-12-28 ENCOUNTER — Encounter: Payer: Self-pay | Admitting: Family Medicine

## 2016-12-28 VITALS — BP 138/80 | HR 80 | Ht 64.0 in | Wt 205.0 lb

## 2016-12-28 DIAGNOSIS — J0100 Acute maxillary sinusitis, unspecified: Secondary | ICD-10-CM

## 2016-12-28 DIAGNOSIS — J40 Bronchitis, not specified as acute or chronic: Secondary | ICD-10-CM

## 2016-12-28 MED ORDER — AMOXICILLIN 500 MG PO CAPS: 500.0000 mg | ORAL_CAPSULE | Freq: Three times a day (TID) | ORAL | 0 refills | Status: DC

## 2016-12-28 NOTE — Progress Notes (Signed)
Name: Susan Gregory   MRN: [id number]    DOB: 06/22/1960   Date:12/28/2016       Progress Note  Subjective  Chief Complaint  Chief Complaint  Patient presents with  . Sinusitis    bloody nose and sinus headache on L) side of face. Also has a hurting in her L) side    Sinusitis  This is a new problem. The current episode started in the past 7 days. The problem has been waxing and waning since onset. There has been no fever. The pain is mild (left lateral chest). Associated symptoms include congestion, coughing, headaches and sinus pressure. Pertinent negatives include no chills, diaphoresis, ear pain, hoarse voice, neck pain, shortness of breath, sneezing, sore throat or swollen glands. (Fatigue/nasal discharge bloody) Past treatments include nothing.    No problem-specific Assessment & Plan notes found for this encounter.   Past Medical History:  Diagnosis Date  . Allergy   . Barrett's esophagus   . GERD (gastroesophageal reflux disease)   . Hyperlipidemia   . Hypertension     Past Surgical History:  Procedure Laterality Date  . BREAST BIOPSY Right 09/04/2016   US guided right breast biopsy. Coil marker  . BREAST BIOPSY Right 09/07/2016   stereo biopsy. Ribbon marker  . ESOPHAGOGASTRODUODENOSCOPY (EGD) WITH PROPOFOL N/A 04/20/2016   Performed by Christena DeemSkulskie, Martin U, [facility]  . THROAT SURGERY     nodules taken off throat  . TUBAL LIGATION      Family History  Problem Relation Age of Onset  . Breast cancer Cousin     Social History   Socioeconomic History  . Marital status: Married    Spouse name: Not on file  . Number of children: Not on file  . Years of education: Not on file  . Highest education level: Not on file  Social Needs  . Financial resource strain: Not on file  . Food insecurity - worry: Not on file  . Food insecurity - inability: Not on file  . Transportation needs - medical: Not on file  . Transportation needs - non-medical: Not on  file  Occupational History  . Not on file  Tobacco Use  . Smoking status: Never Smoker  . Smokeless tobacco: Never Used  Substance and Sexual Activity  . Alcohol use: No    Alcohol/week: 0.0 oz  . Drug use: No  . Sexual activity: Not Currently  Other Topics Concern  . Not on file  Social History Narrative  . Not on file    No Known Allergies  Outpatient Medications Prior to Visit  Medication Sig Dispense Refill  . aspirin 81 MG tablet Take 1 tablet by mouth daily.    . celecoxib (CELEBREX) 200 MG capsule TAKE 1 CAPSULE BY MOUTH EVERY DAY 30 capsule 2  . clobetasol (TEMOVATE) 0.05 % external solution Apply 1 application topically 2 (two) times daily. Dr Cheree DittoGraham    . griseofulvin (GRIFULVIN V) 500 MG tablet Take 500 mg by mouth 2 (two) times daily. Dr Cheree DittoGraham  5  . hydrochlorothiazide (MICROZIDE) 12.5 MG capsule TAKE 1 CAPSULE BY MOUTH DAILY 90 capsule 2  . ketoconazole (NIZORAL) 2 % shampoo Apply 1 application topically as needed. Dr Cheree DittoGraham 120 mL 1  . losartan (COZAAR) 50 MG tablet Take 1 tablet (50 mg total) by mouth daily. 90 tablet 2  . pantoprazole (PROTONIX) 40 MG tablet Take 1 tablet by mouth daily. Dr Marva PandaSkulskie    . PROAIR HFA 108 (90  Base) MCG/ACT inhaler INHALE 2 PUFFS INTO THE LUNGS EVERY 6 (SIX) HOURS AS NEEDED FOR WHEEZING OR SHORTNESS OF BREATH. 8.5 Inhaler 0  . simvastatin (ZOCOR) 20 MG tablet Take 1 tablet (20 mg total) by mouth daily. 90 tablet 2   No facility-administered medications prior to visit.     Review of Systems  Constitutional: Negative for chills, diaphoresis, fever, malaise/fatigue and weight loss.  HENT: Positive for congestion and sinus pressure. Negative for ear discharge, ear pain, hoarse voice, sneezing and sore throat.   Eyes: Negative for blurred vision.  Respiratory: Positive for cough. Negative for sputum production, shortness of breath and wheezing.   Cardiovascular: Negative for chest pain, palpitations and leg swelling.  Gastrointestinal:  Negative for abdominal pain, blood in stool, constipation, diarrhea, heartburn, melena and nausea.  Genitourinary: Negative for dysuria, frequency, hematuria and urgency.  Musculoskeletal: Negative for back pain, joint pain, myalgias and neck pain.  Skin: Negative for rash.  Neurological: Positive for headaches. Negative for dizziness, tingling, sensory change and focal weakness.  Endo/Heme/Allergies: Negative for environmental allergies and polydipsia. Does not bruise/bleed easily.  Psychiatric/Behavioral: Negative for depression and suicidal ideas. The patient is not nervous/anxious and does not have insomnia.      Objective  Vitals:   12/28/16 0935  BP: 138/80  Pulse: 80  Weight: 205 lb (93 kg)  Height: 5\' 4"  (1.626 m)    Physical Exam  Constitutional: She is well-developed, well-nourished, and in no distress. No distress.  HENT:  Head: Normocephalic and atraumatic.  Right Ear: Tympanic membrane and external ear normal.  Left Ear: Tympanic membrane and external ear normal.  Nose: Right sinus exhibits maxillary sinus tenderness. Left sinus exhibits maxillary sinus tenderness.  Mouth/Throat: Oropharynx is clear and moist.  Eyes: Conjunctivae and EOM are normal. Pupils are equal, round, and reactive to light. Right eye exhibits no discharge. Left eye exhibits no discharge.  Neck: Normal range of motion. Neck supple. No JVD present. No thyromegaly present.  Cardiovascular: Normal rate, regular rhythm, normal heart sounds and intact distal pulses. Exam reveals no gallop and no friction rub.  No murmur heard. Pulmonary/Chest: Effort normal and breath sounds normal. She has no wheezes. She has no rales.  Abdominal: Soft. Bowel sounds are normal. She exhibits no mass. There is no tenderness. There is no guarding.  Musculoskeletal: Normal range of motion. She exhibits no edema.  Lymphadenopathy:    She has no cervical adenopathy.  Neurological: She is alert. She has normal reflexes.   Skin: Skin is warm and dry. She is not diaphoretic.  Psychiatric: Mood and affect normal.  Nursing note and vitals reviewed.     Assessment & Plan  Problem List Items Addressed This Visit    None    Visit Diagnoses    Acute maxillary sinusitis, recurrence not specified    -  Primary   nasocort sample   Relevant Medications   amoxicillin (AMOXIL) 500 MG capsule   Bronchitis       Relevant Medications   amoxicillin (AMOXIL) 500 MG capsule      Meds ordered this encounter  Medications  . amoxicillin (AMOXIL) 500 MG capsule    Sig: Take 1 capsule (500 mg total) 3 (three) times daily by mouth.    Dispense:  30 capsule    Refill:  0      Dr. Elizabeth Sauereanna Anaston Koehn [facility]  12/28/16

## 2017-01-21 ENCOUNTER — Other Ambulatory Visit: Payer: Self-pay | Admitting: Family Medicine

## 2017-01-21 DIAGNOSIS — I10 Essential (primary) hypertension: Secondary | ICD-10-CM

## 2017-02-14 ENCOUNTER — Other Ambulatory Visit: Payer: Self-pay | Admitting: Family Medicine

## 2017-02-14 DIAGNOSIS — I10 Essential (primary) hypertension: Secondary | ICD-10-CM

## 2017-03-04 ENCOUNTER — Other Ambulatory Visit: Payer: Self-pay

## 2017-03-25 ENCOUNTER — Other Ambulatory Visit: Payer: Self-pay | Admitting: Family Medicine

## 2017-03-25 DIAGNOSIS — J4521 Mild intermittent asthma with (acute) exacerbation: Secondary | ICD-10-CM

## 2017-04-13 ENCOUNTER — Ambulatory Visit (INDEPENDENT_AMBULATORY_CARE_PROVIDER_SITE_OTHER): Payer: 59 | Admitting: Family Medicine

## 2017-04-13 ENCOUNTER — Encounter: Payer: Self-pay | Admitting: Family Medicine

## 2017-04-13 VITALS — BP 120/82 | HR 88 | Ht 64.0 in | Wt 213.0 lb

## 2017-04-13 DIAGNOSIS — J301 Allergic rhinitis due to pollen: Secondary | ICD-10-CM | POA: Diagnosis not present

## 2017-04-13 DIAGNOSIS — I10 Essential (primary) hypertension: Secondary | ICD-10-CM

## 2017-04-13 MED ORDER — HYDROCHLOROTHIAZIDE 12.5 MG PO CAPS: 12.5000 mg | ORAL_CAPSULE | Freq: Every day | ORAL | 0 refills | Status: DC

## 2017-04-13 MED ORDER — LOSARTAN POTASSIUM 50 MG PO TABS: 50.0000 mg | ORAL_TABLET | Freq: Every day | ORAL | 0 refills | Status: DC

## 2017-04-13 MED ORDER — FLUTICASONE PROPIONATE 50 MCG/ACT NA SUSP: 2.0000 | Freq: Every day | NASAL | 6 refills | Status: DC

## 2017-04-13 NOTE — Progress Notes (Signed)
Name: Susan Gregory   MRN: [id number]    DOB: January 24, 1961   Date:04/13/2017       Progress Note  Subjective  Chief Complaint  Chief Complaint  Patient presents with  . Sinusitis    sinus drainage- taking mucinex otc/ helps some    Sinusitis  This is a new problem. The current episode started in the past 7 days. The problem has been waxing and waning since onset. There has been no fever. She is experiencing no pain. Associated symptoms include congestion, ear pain, headaches and sinus pressure. Pertinent negatives include no chills, coughing, diaphoresis, hoarse voice, neck pain, shortness of breath, sneezing, sore throat or swollen glands. Treatments tried: mucinex/sinus. The treatment provided mild relief.    No problem-specific Assessment & Plan notes found for this encounter.   Past Medical History:  Diagnosis Date  . Allergy   . Barrett's esophagus   . GERD (gastroesophageal reflux disease)   . Hyperlipidemia   . Hypertension     Past Surgical History:  Procedure Laterality Date  . BREAST BIOPSY Right 09/04/2016   US guided right breast biopsy. Coil marker  . BREAST BIOPSY Right 09/07/2016   stereo biopsy. Ribbon marker  . ESOPHAGOGASTRODUODENOSCOPY (EGD) WITH PROPOFOL N/A 04/20/2016   Procedure: ESOPHAGOGASTRODUODENOSCOPY (EGD) WITH PROPOFOL;  Surgeon: Christena Deem, MD;  Location: [facility] ENDOSCOPY;  Service: Endoscopy;  Laterality: N/A;  . THROAT SURGERY     nodules taken off throat  . TUBAL LIGATION      Family History  Problem Relation Age of Onset  . Breast cancer Cousin     Social History   Socioeconomic History  . Marital status: Married    Spouse name: Not on file  . Number of children: Not on file  . Years of education: Not on file  . Highest education level: Not on file  Social Needs  . Financial resource strain: Not on file  . Food insecurity - worry: Not on file  . Food insecurity - inability: Not on file  . Transportation needs - medical: Not  on file  . Transportation needs - non-medical: Not on file  Occupational History  . Not on file  Tobacco Use  . Smoking status: Never Smoker  . Smokeless tobacco: Never Used  Substance and Sexual Activity  . Alcohol use: No    Alcohol/week: 0.0 oz  . Drug use: No  . Sexual activity: Not Currently  Other Topics Concern  . Not on file  Social History Narrative  . Not on file    No Known Allergies  Outpatient Medications Prior to Visit  Medication Sig Dispense Refill  . albuterol (PROAIR HFA) 108 (90 Base) MCG/ACT inhaler 2 puffs q 6 hr prn wheezing 1 Inhaler 1  . aspirin 81 MG tablet Take 1 tablet by mouth daily.    . celecoxib (CELEBREX) 200 MG capsule TAKE 1 CAPSULE BY MOUTH EVERY DAY 30 capsule 2  . clobetasol (TEMOVATE) 0.05 % external solution Apply 1 application topically 2 (two) times daily. Dr Cheree Ditto    . griseofulvin (GRIFULVIN V) 500 MG tablet Take 500 mg by mouth 2 (two) times daily. Dr Cheree Ditto  5  . ketoconazole (NIZORAL) 2 % shampoo Apply 1 application topically as needed. Dr Cheree Ditto 120 mL 1  . pantoprazole (PROTONIX) 40 MG tablet Take 1 tablet by mouth daily. Dr Marva Panda    . simvastatin (ZOCOR) 20 MG tablet Take 1 tablet (20 mg total) by mouth daily. 90 tablet 2  .  hydrochlorothiazide (MICROZIDE) 12.5 MG capsule TAKE 1 CAPSULE BY MOUTH DAILY 30 capsule 0  . losartan (COZAAR) 50 MG tablet TAKE 1 TABLET BY MOUTH EVERY DAY 90 tablet 0  . amoxicillin (AMOXIL) 500 MG capsule Take 1 capsule (500 mg total) 3 (three) times daily by mouth. 30 capsule 0   No facility-administered medications prior to visit.     Review of Systems  Constitutional: Negative for chills, diaphoresis, fever, malaise/fatigue and weight loss.  HENT: Positive for congestion, ear pain and sinus pressure. Negative for ear discharge, hoarse voice, sneezing and sore throat.   Eyes: Negative for blurred vision.  Respiratory: Negative for cough, sputum production, shortness of breath and wheezing.    Cardiovascular: Negative for chest pain, palpitations and leg swelling.  Gastrointestinal: Negative for abdominal pain, blood in stool, constipation, diarrhea, heartburn, melena and nausea.  Genitourinary: Negative for dysuria, frequency, hematuria and urgency.  Musculoskeletal: Negative for back pain, joint pain, myalgias and neck pain.  Skin: Negative for rash.  Neurological: Positive for headaches. Negative for dizziness, tingling, sensory change and focal weakness.  Endo/Heme/Allergies: Negative for environmental allergies and polydipsia. Does not bruise/bleed easily.  Psychiatric/Behavioral: Negative for depression and suicidal ideas. The patient is not nervous/anxious and does not have insomnia.      Objective  Vitals:   04/13/17 1444  BP: 120/82  Pulse: 88  Weight: 213 lb (96.6 kg)  Height: 5\' 4"  (1.626 m)    Physical Exam  Constitutional: She is well-developed, well-nourished, and in no distress. No distress.  HENT:  Head: Normocephalic and atraumatic.  Right Ear: External ear normal.  Left Ear: External ear normal.  Nose: Nose normal.  Mouth/Throat: Oropharynx is clear and moist.  Eyes: Conjunctivae and EOM are normal. Pupils are equal, round, and reactive to light. Right eye exhibits no discharge. Left eye exhibits no discharge.  Neck: Normal range of motion. Neck supple. No JVD present. No thyromegaly present.  Cardiovascular: Normal rate, regular rhythm, normal heart sounds and intact distal pulses. Exam reveals no gallop and no friction rub.  No murmur heard. Pulmonary/Chest: Effort normal and breath sounds normal. She has no wheezes. She has no rales.  Abdominal: Soft. Bowel sounds are normal. She exhibits no mass. There is no tenderness. There is no rebound and no guarding.  Musculoskeletal: Normal range of motion. She exhibits no edema.  Lymphadenopathy:    She has no cervical adenopathy.  Neurological: She is alert. She has normal reflexes.  Skin: Skin is  warm and dry. She is not diaphoretic.  Psychiatric: Mood and affect normal.  Nursing note and vitals reviewed.     Assessment & Plan  Problem List Items Addressed This Visit      Cardiovascular and Mediastinum   Benign hypertension   Relevant Medications   losartan (COZAAR) 50 MG tablet   hydrochlorothiazide (MICROZIDE) 12.5 MG capsule    Other Visit Diagnoses    Seasonal allergic rhinitis due to pollen    -  Primary   pseudafed 30 mg   Relevant Medications   fluticasone (FLONASE) 50 MCG/ACT nasal spray      Meds ordered this encounter  Medications  . fluticasone (FLONASE) 50 MCG/ACT nasal spray    Sig: Place 2 sprays into both nostrils daily.    Dispense:  16 g    Refill:  6  . losartan (COZAAR) 50 MG tablet    Sig: Take 1 tablet (50 mg total) by mouth daily.    Dispense:  30 tablet  Refill:  0  . hydrochlorothiazide (MICROZIDE) 12.5 MG capsule    Sig: Take 1 capsule (12.5 mg total) by mouth daily.    Dispense:  30 capsule    Refill:  0    Call and sched appt      Dr. Elizabeth Sauereanna Jones [facility]  04/13/17

## 2017-04-24 ENCOUNTER — Other Ambulatory Visit: Payer: Self-pay | Admitting: Family Medicine

## 2017-04-24 DIAGNOSIS — I10 Essential (primary) hypertension: Secondary | ICD-10-CM

## 2017-05-03 ENCOUNTER — Encounter: Payer: Self-pay | Admitting: Family Medicine

## 2017-05-03 ENCOUNTER — Ambulatory Visit (INDEPENDENT_AMBULATORY_CARE_PROVIDER_SITE_OTHER): Payer: 59 | Admitting: Family Medicine

## 2017-05-03 VITALS — BP 130/100 | HR 80 | Temp 98.5°F | Ht 64.0 in | Wt 210.0 lb

## 2017-05-03 DIAGNOSIS — K219 Gastro-esophageal reflux disease without esophagitis: Secondary | ICD-10-CM

## 2017-05-03 DIAGNOSIS — I10 Essential (primary) hypertension: Secondary | ICD-10-CM | POA: Diagnosis not present

## 2017-05-03 DIAGNOSIS — J0100 Acute maxillary sinusitis, unspecified: Secondary | ICD-10-CM

## 2017-05-03 DIAGNOSIS — J301 Allergic rhinitis due to pollen: Secondary | ICD-10-CM | POA: Diagnosis not present

## 2017-05-03 DIAGNOSIS — E7849 Other hyperlipidemia: Secondary | ICD-10-CM

## 2017-05-03 MED ORDER — GUAIFENESIN-CODEINE 100-10 MG/5ML PO SYRP: 5.0000 mL | ORAL_SOLUTION | Freq: Three times a day (TID) | ORAL | 0 refills | Status: DC | PRN

## 2017-05-03 MED ORDER — CEFUROXIME AXETIL 250 MG PO TABS: 250.0000 mg | ORAL_TABLET | Freq: Two times a day (BID) | ORAL | 1 refills | Status: DC

## 2017-05-03 MED ORDER — PANTOPRAZOLE SODIUM 40 MG PO TBEC: 40.0000 mg | DELAYED_RELEASE_TABLET | Freq: Every day | ORAL | 1 refills | Status: DC

## 2017-05-03 MED ORDER — FLUTICASONE PROPIONATE 50 MCG/ACT NA SUSP: 2.0000 | Freq: Every day | NASAL | 6 refills | Status: DC

## 2017-05-03 MED ORDER — LOSARTAN POTASSIUM 50 MG PO TABS: 50.0000 mg | ORAL_TABLET | Freq: Every day | ORAL | 1 refills | Status: DC

## 2017-05-03 MED ORDER — HYDROCHLOROTHIAZIDE 12.5 MG PO CAPS: 12.5000 mg | ORAL_CAPSULE | Freq: Every day | ORAL | 1 refills | Status: DC

## 2017-05-03 MED ORDER — SIMVASTATIN 20 MG PO TABS: 20.0000 mg | ORAL_TABLET | Freq: Every day | ORAL | 2 refills | Status: DC

## 2017-05-03 NOTE — Progress Notes (Signed)
Name: Susan Gregory   MRN: [id number]    DOB: [birth year]   Date:05/03/2017       Progress Note  Subjective  Chief Complaint  Chief Complaint  Patient presents with  . Hypertension  . Gastroesophageal Reflux  . Hyperlipidemia  . Allergic Rhinitis   . Arthritis  . Sinusitis    seen 04/13/17- told to take sudafed    Hypertension  This is a chronic problem. The current episode started more than 1 year ago. The problem is unchanged. The problem is controlled. Associated symptoms include malaise/fatigue. Pertinent negatives include no blurred vision, chest pain, headaches, neck pain, palpitations or shortness of breath. There are no associated agents to hypertension. There are no known risk factors for coronary artery disease. Past treatments include angiotensin blockers and diuretics. The current treatment provides moderate improvement. There are no compliance problems.  There is no history of angina, kidney disease, CAD/MI, CVA, heart failure, left ventricular hypertrophy, PVD or retinopathy. There is no history of chronic renal disease, a hypertension causing med or renovascular disease.  Gastroesophageal Reflux  She reports no abdominal pain, no belching, no chest pain, no choking, no coughing, no dysphagia, no heartburn, no hoarse voice, no nausea, no sore throat or no wheezing. The current episode started today. The problem occurs frequently. Pertinent negatives include no melena or weight loss.  Hyperlipidemia  This is a chronic problem. The problem is controlled. Recent lipid tests were reviewed and are normal. She has no history of chronic renal disease. Factors aggravating her hyperlipidemia include thiazides. Pertinent negatives include no chest pain, focal weakness, myalgias or shortness of breath. Current antihyperlipidemic treatment includes statins.  Sinus Problem  This is a new problem. The current episode started yesterday. The problem has been gradually worsening since onset. There  has been no fever. Associated symptoms include congestion, sinus pressure and sneezing. Pertinent negatives include no chills, coughing, ear pain, headaches, hoarse voice, neck pain, shortness of breath or sore throat.    No problem-specific Assessment & Plan notes found for this encounter.   Past Medical History:  Diagnosis Date  . Allergy   . Barrett's esophagus   . GERD (gastroesophageal reflux disease)   . Hyperlipidemia   . Hypertension     Past Surgical History:  Procedure Laterality Date  . BREAST BIOPSY Right 09/04/2016   US guided right breast biopsy. Coil marker  . BREAST BIOPSY Right 09/07/2016   stereo biopsy. Ribbon marker  . ESOPHAGOGASTRODUODENOSCOPY (EGD) WITH PROPOFOL N/A 04/20/2016   Procedure: ESOPHAGOGASTRODUODENOSCOPY (EGD) WITH PROPOFOL;  Surgeon: Christena Deem, MD;  [facility] END[facility]COPY;  Service: Endoscopy;  Laterality: N/A;  . THROAT SURGERY     nodules taken off throat  . TUBAL LIGATION      Family History  Problem Relation Age of Onset  . Breast cancer Cousin     Social History   Socioeconomic History  . Marital status: Married    Spouse name: Not on file  . Number of children: Not on file  . Years of education: Not on file  . Highest education level: Not on file  Occupational History  . Not on file  Social Needs  . Financial resource strain: Not on file  . Food insecurity:    Worry: Not on file    Inability: Not on file  . Transportation needs:    Medical: Not on file    Non-medical: Not on file  Tobacco Use  . Smoking status: Never Smoker  . Smokeless  tobacco: Never Used  Substance and Sexual Activity  . Alcohol use: No    Alcohol/week: 0.0 oz  . Drug use: No  . Sexual activity: Not Currently  Lifestyle  . Physical activity:    Days per week: Not on file    Minutes per session: Not on file  . Stress: Not on file  Relationships  . Social connections:    Talks on phone: Not on file    Gets together: Not on file     Attends religious service: Not on file    Active member of club or organization: Not on file    Attends meetings of clubs or organizations: Not on file    Relationship status: Not on file  . Intimate partner violence:    Fear of current or ex partner: Not on file    Emotionally abused: Not on file    Physically abused: Not on file    Forced sexual activity: Not on file  Other Topics Concern  . Not on file  Social History Narrative  . Not on file    No Known Allergies  Outpatient Medications Prior to Visit  Medication Sig Dispense Refill  . albuterol (PROAIR HFA) 108 (90 Base) MCG/ACT inhaler 2 puffs q 6 hr prn wheezing 1 Inhaler 1  . aspirin 81 MG tablet Take 1 tablet by mouth daily.    . celecoxib (CELEBREX) 200 MG capsule TAKE 1 CAPSULE BY MOUTH EVERY DAY 30 capsule 2  . clobetasol (TEMOVATE) 0.05 % external solution Apply 1 application topically 2 (two) times daily. Dr Cheree Ditto    . griseofulvin (GRIFULVIN V) 500 MG tablet Take 500 mg by mouth 2 (two) times daily. Dr Cheree Ditto  5  . ketoconazole (NIZORAL) 2 % shampoo Apply 1 application topically as needed. Dr Cheree Ditto 120 mL 1  . fluticasone (FLONASE) 50 MCG/ACT nasal spray Place 2 sprays into both nostrils daily. 16 g 6  . hydrochlorothiazide (MICROZIDE) 12.5 MG capsule Take 1 capsule (12.5 mg total) by mouth daily. 30 capsule 0  . losartan (COZAAR) 50 MG tablet Take 1 tablet (50 mg total) by mouth daily. 30 tablet 0  . pantoprazole (PROTONIX) 40 MG tablet Take 1 tablet by mouth daily. Dr Marva Panda    . simvastatin (ZOCOR) 20 MG tablet Take 1 tablet (20 mg total) by mouth daily. 90 tablet 2  . losartan (COZAAR) 50 MG tablet TAKE 1 TABLET BY MOUTH EVERY DAY 90 tablet 0   No facility-administered medications prior to visit.     Review of Systems  Constitutional: Positive for malaise/fatigue. Negative for chills, fever and weight loss.  HENT: Positive for congestion, sinus pressure and sneezing. Negative for ear discharge, ear pain,  hoarse voice and sore throat.   Eyes: Negative for blurred vision.  Respiratory: Negative for cough, sputum production, choking, shortness of breath and wheezing.   Cardiovascular: Negative for chest pain, palpitations and leg swelling.  Gastrointestinal: Negative for abdominal pain, blood in stool, constipation, diarrhea, dysphagia, heartburn, melena and nausea.  Genitourinary: Negative for dysuria, frequency, hematuria and urgency.  Musculoskeletal: Negative for back pain, joint pain, myalgias and neck pain.  Skin: Negative for rash.  Neurological: Negative for dizziness, tingling, sensory change, focal weakness and headaches.  Endo/Heme/Allergies: Negative for environmental allergies and polydipsia. Does not bruise/bleed easily.  Psychiatric/Behavioral: Negative for depression and suicidal ideas. The patient is not nervous/anxious and does not have insomnia.      Objective  Vitals:   05/03/17 0806  BP: (!) 130/100  Pulse: 80  Temp: 98.5 F (36.9 C)  TempSrc: Oral  Weight: 210 lb (95.3 kg)  Height: 5\' 4"  (1.626 m)    Physical Exam  Constitutional: She is well-developed, well-nourished, and in no distress. No distress.  HENT:  Head: Normocephalic and atraumatic.  Right Ear: External ear normal.  Left Ear: External ear normal.  Nose: Nose normal.  Mouth/Throat: Oropharynx is clear and moist.  Eyes: Pupils are equal, round, and reactive to light. Conjunctivae and EOM are normal. Right eye exhibits no discharge. Left eye exhibits no discharge.  Neck: Normal range of motion. Neck supple. No JVD present. No thyromegaly present.  Cardiovascular: Normal rate, regular rhythm, normal heart sounds and intact distal pulses. Exam reveals no gallop and no friction rub.  No murmur heard. Pulmonary/Chest: Effort normal and breath sounds normal. She has no wheezes. She has no rales.  Abdominal: Soft. Bowel sounds are normal. She exhibits no mass. There is no tenderness. There is no guarding.   Musculoskeletal: Normal range of motion. She exhibits no edema.  Lymphadenopathy:    She has no cervical adenopathy.  Neurological: She is alert. She has normal reflexes.  Skin: Skin is warm and dry. She is not diaphoretic.  Psychiatric: Mood and affect normal.  Nursing note and vitals reviewed.     Assessment & Plan  Problem List Items Addressed This Visit      Cardiovascular and Mediastinum   Benign hypertension   Relevant Medications   hydrochlorothiazide (MICROZIDE) 12.5 MG capsule   losartan (COZAAR) 50 MG tablet   simvastatin (ZOCOR) 20 MG tablet   Other Relevant Orders   Renal Function Panel     Digestive   Acid reflux   Relevant Medications   pantoprazole (PROTONIX) 40 MG tablet     Other   Familial multiple lipoprotein-type hyperlipidemia   Relevant Medications   hydrochlorothiazide (MICROZIDE) 12.5 MG capsule   losartan (COZAAR) 50 MG tablet   simvastatin (ZOCOR) 20 MG tablet   Other Relevant Orders   Lipid panel    Other Visit Diagnoses    Acute maxillary sinusitis, recurrence not specified    -  Primary   Relevant Medications   fluticasone (FLONASE) 50 MCG/ACT nasal spray   cefUROXime (CEFTIN) 250 MG tablet   guaiFENesin-codeine (ROBITUSSIN AC) 100-10 MG/5ML syrup   Seasonal allergic rhinitis due to pollen       pseudafed 30 mg   Relevant Medications   fluticasone (FLONASE) 50 MCG/ACT nasal spray      Meds ordered this encounter  Medications  . hydrochlorothiazide (MICROZIDE) 12.5 MG capsule    Sig: Take 1 capsule (12.5 mg total) by mouth daily.    Dispense:  90 capsule    Refill:  1    Call and sched appt  . losartan (COZAAR) 50 MG tablet    Sig: Take 1 tablet (50 mg total) by mouth daily.    Dispense:  90 tablet    Refill:  1  . simvastatin (ZOCOR) 20 MG tablet    Sig: Take 1 tablet (20 mg total) by mouth daily.    Dispense:  90 tablet    Refill:  2    sched appt  . pantoprazole (PROTONIX) 40 MG tablet    Sig: Take 1 tablet (40 mg  total) by mouth daily. Dr Marva PandaSkulskie    Dispense:  90 tablet    Refill:  1  . fluticasone (FLONASE) 50 MCG/ACT nasal spray    Sig: Place 2 sprays into both nostrils  daily.    Dispense:  16 g    Refill:  6  . cefUROXime (CEFTIN) 250 MG tablet    Sig: Take 1 tablet (250 mg total) by mouth 2 (two) times daily with a meal.    Dispense:  20 tablet    Refill:  1  . guaiFENesin-codeine (ROBITUSSIN AC) 100-10 MG/5ML syrup    Sig: Take 5 mLs by mouth 3 (three) times daily as needed for cough.    Dispense:  150 mL    Refill:  0      Dr. Elizabeth Sauer [facility]  05/03/17

## 2017-05-04 LAB — RENAL FUNCTION PANEL
Albumin: 4.1 g/dL (ref 3.5–5.5)
BUN / CREAT RATIO: 15 (ref 9–23)
BUN: 11 mg/dL (ref 6–24)
CHLORIDE: 103 mmol/L (ref 96–106)
CO2: 27 mmol/L (ref 20–29)
CREATININE: 0.74 mg/dL (ref 0.57–1.00)
Calcium: 9 mg/dL (ref 8.7–10.2)
GFR calc non Af Amer: 89 mL/min/{1.73_m2} (ref >59)
GFR, EST AFRICAN AMERICAN: 103 mL/min/{1.73_m2} (ref >59)
Glucose: 82 mg/dL (ref 65–99)
Phosphorus: 2.6 mg/dL (ref 2.5–4.5)
Potassium: 3.9 mmol/L (ref 3.5–5.2)
SODIUM: 144 mmol/L (ref 134–144)

## 2017-05-04 LAB — LIPID PANEL
Chol/HDL Ratio: 2.9 ratio (ref 0.0–4.4)
Cholesterol, Total: 157 mg/dL (ref 100–199)
HDL: 55 mg/dL (ref >39)
LDL CALC: 89 mg/dL (ref 0–99)
Triglycerides: 66 mg/dL (ref 0–149)
VLDL CHOLESTEROL CAL: 13 mg/dL (ref 5–40)

## 2017-05-05 ENCOUNTER — Emergency Department
Admission: EM | Admit: 2017-05-05 | Discharge: 2017-05-05 | Disposition: A | Discharge status: Home / Self Care | Payer: 59 | Attending: Emergency Medicine | Admitting: Emergency Medicine

## 2017-05-05 ENCOUNTER — Emergency Department: Payer: 59

## 2017-05-05 ENCOUNTER — Other Ambulatory Visit: Payer: Self-pay

## 2017-05-05 DIAGNOSIS — Z79899 Other long term (current) drug therapy: Secondary | ICD-10-CM | POA: Insufficient documentation

## 2017-05-05 DIAGNOSIS — I10 Essential (primary) hypertension: Secondary | ICD-10-CM | POA: Diagnosis not present

## 2017-05-05 DIAGNOSIS — Z7982 Long term (current) use of aspirin: Secondary | ICD-10-CM | POA: Insufficient documentation

## 2017-05-05 DIAGNOSIS — J1000 Influenza due to other identified influenza virus with unspecified type of pneumonia: Secondary | ICD-10-CM | POA: Diagnosis not present

## 2017-05-05 DIAGNOSIS — J101 Influenza due to other identified influenza virus with other respiratory manifestations: Secondary | ICD-10-CM

## 2017-05-05 DIAGNOSIS — R509 Fever, unspecified: Secondary | ICD-10-CM | POA: Diagnosis present

## 2017-05-05 DIAGNOSIS — J181 Lobar pneumonia, unspecified organism: Secondary | ICD-10-CM

## 2017-05-05 DIAGNOSIS — J189 Pneumonia, unspecified organism: Secondary | ICD-10-CM

## 2017-05-05 LAB — INFLUENZA PANEL BY PCR (TYPE A & B)
Influenza A By PCR: POSITIVE — AB
Influenza B By PCR: NEGATIVE

## 2017-05-05 MED ORDER — IBUPROFEN 800 MG PO TABS
800.0000 mg | ORAL_TABLET | Freq: Once | ORAL | Status: AC
  Administered 2017-05-05: 800 mg via ORAL

## 2017-05-05 MED ORDER — IPRATROPIUM-ALBUTEROL 0.5-2.5 (3) MG/3ML IN SOLN
3.0000 mL | Freq: Once | RESPIRATORY_TRACT | Status: AC
  Administered 2017-05-05: 3 mL via RESPIRATORY_TRACT
  Filled 2017-05-05: qty 3

## 2017-05-05 MED ORDER — CEFTRIAXONE SODIUM 1 G IJ SOLR
1.0000 g | Freq: Once | INTRAMUSCULAR | Status: AC
  Administered 2017-05-05: 1 g via INTRAMUSCULAR
  Filled 2017-05-05: qty 10

## 2017-05-05 MED ORDER — IBUPROFEN 100 MG/5ML PO SUSP: 800.0000 mg | Freq: Once | ORAL | Status: DC

## 2017-05-05 MED ORDER — OSELTAMIVIR PHOSPHATE 75 MG PO CAPS
75.0000 mg | ORAL_CAPSULE | Freq: Once | ORAL | Status: AC
  Administered 2017-05-05: 75 mg via ORAL
  Filled 2017-05-05: qty 1

## 2017-05-05 MED ORDER — IBUPROFEN 800 MG PO TABS
ORAL_TABLET | ORAL | Status: AC
  Administered 2017-05-05: 800 mg via ORAL
  Filled 2017-05-05: qty 1

## 2017-05-05 MED ORDER — OSELTAMIVIR PHOSPHATE 75 MG PO CAPS: 75.0000 mg | ORAL_CAPSULE | Freq: Two times a day (BID) | ORAL | 0 refills | Status: AC

## 2017-05-05 MED ORDER — AZITHROMYCIN 250 MG PO TABS: ORAL_TABLET | ORAL | 0 refills | Status: DC

## 2017-05-05 MED ORDER — BENZONATATE 100 MG PO CAPS: 100.0000 mg | ORAL_CAPSULE | Freq: Three times a day (TID) | ORAL | 0 refills | Status: DC | PRN

## 2017-05-05 NOTE — ED Provider Notes (Signed)
[facility] Emergency Department Provider Note  ____________________________________________  Time seen: Approximately 8:14 PM  I have reviewed the triage vital signs and the nursing notes.   HISTORY  Chief Complaint Fever    HPI Susan Gregory is a 57 y.o. female that presents to the emergency department for evaluation of fever, body aches, nonproductive cough for 3 days.  Patient does not smoke.  No allergies or asthma.  She is eating less but drinking well.  No SOB, CP, nausea, vomiting, abdominal pain, diarrhea, constipation.   Past Medical History:  Diagnosis Date  . Allergy   . Barrett's esophagus   . GERD (gastroesophageal reflux disease)   . Hyperlipidemia   . Hypertension     Patient Active Problem List   Diagnosis Date Noted  . Familial multiple lipoprotein-type hyperlipidemia 07/18/2014  . Disorder of lipoprotein and lipid metabolism 07/18/2014  . Benign hypertension 07/18/2014  . Acid reflux 07/18/2014  . Encounter for general adult medical examination without abnormal findings 07/18/2014  . Neck sprain and strain 07/18/2014  . Abnormal breast finding 07/18/2014  . Abnormal finding on breast imaging 07/18/2014  . Arthritis of knee, degenerative 07/18/2014  . TB skin/subcutaneous 07/18/2014  . Fecal occult blood test positive 11/01/2013    Past Surgical History:  Procedure Laterality Date  . BREAST BIOPSY Right 09/04/2016   US guided right breast biopsy. Coil marker  . BREAST BIOPSY Right 09/07/2016   stereo biopsy. Ribbon marker  . ESOPHAGOGASTRODUODENOSCOPY (EGD) WITH PROPOFOL N/A 04/20/2016   Procedure: ESOPHAGOGASTRODUODENOSCOPY (EGD) WITH PROPOFOL;  Surgeon: Christena DeemMartin U Skulskie, MD;  [facility];  Service: Endoscopy;  Laterality: N/A;  . THROAT SURGERY     nodules taken off throat  . TUBAL LIGATION      Prior to Admission medications   Medication Sig Start Date End Date Taking? Authorizing Provider  albuterol  (PROAIR HFA) 108 (90 Base) MCG/ACT inhaler 2 puffs q 6 hr prn wheezing 03/25/17   Duanne LimerickJones, Deanna C, MD  aspirin 81 MG tablet Take 1 tablet by mouth daily.    [provider]  azithromycin (ZITHROMAX Z-PAK) 250 MG tablet Take 2 tablets (500 mg) on  Day 1,  followed by 1 tablet (250 mg) once daily on Days 2 through 5. 05/05/17   Enid DerryWagner, Colby Reels, PA-C  benzonatate (TESSALON PERLES) 100 MG capsule Take 1 capsule (100 mg total) by mouth 3 (three) times daily as needed for cough. 05/05/17 05/05/18  Enid DerryWagner, Sherine Cortese, PA-C  cefUROXime (CEFTIN) 250 MG tablet Take 1 tablet (250 mg total) by mouth 2 (two) times daily with a meal. 05/03/17   Duanne LimerickJones, Deanna C, MD  celecoxib (CELEBREX) 200 MG capsule TAKE 1 CAPSULE BY MOUTH EVERY DAY 09/16/16   Duanne LimerickJones, Deanna C, MD  clobetasol (TEMOVATE) 0.05 % external solution Apply 1 application topically 2 (two) times daily. Dr Cheree DittoGraham 01/23/13   [provider]  fluticasone (FLONASE) 50 MCG/ACT nasal spray Place 2 sprays into both nostrils daily. 05/03/17   Duanne LimerickJones, Deanna C, MD  griseofulvin (GRIFULVIN V) 500 MG tablet Take 500 mg by mouth 2 (two) times daily. Dr Cheree DittoGraham 06/26/14   [provider]  guaiFENesin-codeine (ROBITUSSIN AC) 100-10 MG/5ML syrup Take 5 mLs by mouth 3 (three) times daily as needed for cough. 05/03/17   Duanne LimerickJones, Deanna C, MD  hydrochlorothiazide (MICROZIDE) 12.5 MG capsule Take 1 capsule (12.5 mg total) by mouth daily. 05/03/17   Duanne LimerickJones, Deanna C, MD  ketoconazole (NIZORAL) 2 % shampoo Apply 1 application topically  as needed. Dr Cheree Ditto 05/22/15   Duanne Limerick, MD  losartan (COZAAR) 50 MG tablet Take 1 tablet (50 mg total) by mouth daily. 05/03/17   Duanne Limerick, MD  oseltamivir (TAMIFLU) 75 MG capsule Take 1 capsule (75 mg total) by mouth 2 (two) times daily for 5 days. 05/05/17 05/10/17  Enid Derry, PA-C  pantoprazole (PROTONIX) 40 MG tablet Take 1 tablet (40 mg total) by mouth daily. Dr Marva Panda 05/03/17   Duanne Limerick, MD  simvastatin  (ZOCOR) 20 MG tablet Take 1 tablet (20 mg total) by mouth daily. 05/03/17   Duanne Limerick, MD    Allergies Patient has no known allergies.  Family History  Problem Relation Age of Onset  . Breast cancer Cousin     Social History Social History   Tobacco Use  . Smoking status: Never Smoker  . Smokeless tobacco: Never Used  Substance Use Topics  . Alcohol use: No    Alcohol/week: 0.0 oz  . Drug use: No     Review of Systems  Constitutional: Positive for chills. Eyes: No visual changes. No discharge. ENT: Negative for congestion and rhinorrhea. Cardiovascular: No chest pain. Respiratory: Positive for cough. No SOB. Gastrointestinal: No abdominal pain.  No nausea, no vomiting.  No diarrhea.  No constipation. Musculoskeletal: Negative for musculoskeletal pain. Skin: Negative for rash, abrasions, lacerations, ecchymosis. Neurological: Negative for headaches.   ____________________________________________   PHYSICAL EXAM:  VITAL SIGNS: ED Triage Vitals  Enc Vitals Group     BP 05/05/17 1910 135/87     Pulse Rate 05/05/17 1908 88     Resp 05/05/17 1908 20     Temp 05/05/17 1908 (!) 102.2 F (39 C)     Temp src --      SpO2 05/05/17 1908 96 %     Weight 05/05/17 1909 200 lb (90.7 kg)     Height 05/05/17 1909 5\' 4"  (1.626 m)     Head Circumference --      Peak Flow --      Pain Score 05/05/17 1909 0     Pain Loc --      Pain Edu? --      Excl. in GC? --      Constitutional: Alert and oriented. Well appearing and in no acute distress. Eyes: Conjunctivae are normal. PERRL. EOMI. No discharge. Head: Atraumatic. ENT: No frontal and maxillary sinus tenderness.      Ears: Tympanic membranes pearly gray with good landmarks. No discharge.      Nose: No congestion/rhinnorhea.      Mouth/Throat: Mucous membranes are moist. Oropharynx non-erythematous. Tonsils not enlarged. No exudates. Uvula midline. Neck: No stridor.   Hematological/Lymphatic/Immunilogical: No  cervical lymphadenopathy. Cardiovascular: Normal rate, regular rhythm.  Good peripheral circulation. Respiratory: Normal respiratory effort without tachypnea or retractions. Lungs CTAB. Good air entry to the bases with no decreased or absent breath sounds. Gastrointestinal: Bowel sounds 4 quadrants. Soft and nontender to palpation. No guarding or rigidity. No palpable masses. No distention. Musculoskeletal: Full range of motion to all extremities. No gross deformities appreciated. Neurologic:  Normal speech and language. No gross focal neurologic deficits are appreciated.  Skin:  Skin is warm, dry and intact. No rash noted. Psychiatric: Mood and affect are normal. Speech and behavior are normal. Patient exhibits appropriate insight and judgement.   ____________________________________________   LABS (all labs ordered are listed, but only abnormal results are displayed)  Labs Reviewed  INFLUENZA PANEL BY PCR (TYPE A & B) -  Abnormal; Notable for the following components:      Result Value   Influenza A By PCR POSITIVE (*)    All other components within normal limits   ____________________________________________  EKG   ____________________________________________  RADIOLOGY Lexine Baton, personally viewed and evaluated these images (plain radiographs) as part of my medical decision making, as well as reviewing the written report by the radiologist.  Dg Chest 2 View  Result Date: 05/05/2017 CLINICAL DATA:  Cough EXAM: CHEST - 2 VIEW COMPARISON:  None. FINDINGS: Diffuse interstitial opacity. Streaky lingular and left lung base opacity. No pleural effusion. Normal heart size. No pneumothorax. IMPRESSION: 1. Diffuse interstitial opacity suggesting bronchitic changes, uncertain chronicity. 2. Streaky atelectasis or minimal infiltrate at the left base and lingula. Electronically Signed   By: Jasmine Pang M.D.   On: 05/05/2017 19:42     ____________________________________________    PROCEDURES  Procedure(s) performed:    Procedures    Medications  ibuprofen (ADVIL,MOTRIN) tablet 800 mg (800 mg Oral Given 05/05/17 1914)  ipratropium-albuterol (DUONEB) 0.5-2.5 (3) MG/3ML nebulizer solution 3 mL (3 mLs Nebulization Given 05/05/17 2045)  cefTRIAXone (ROCEPHIN) injection 1 g (1 g Intramuscular Given 05/05/17 2044)  oseltamivir (TAMIFLU) capsule 75 mg (75 mg Oral Given 05/05/17 2131)     ____________________________________________   INITIAL IMPRESSION / ASSESSMENT AND PLAN / ED COURSE  Pertinent labs & imaging results that were available during my care of the patient were reviewed by me and considered in my medical decision making (see chart for details).  Review of the [place] CSRS was performed in accordance of the [facility] prior to dispensing any controlled drugs.   Patient's diagnosis is consistent with pneumonia and influenza. Vital signs and exam are reassuring.  Influenza A is positive.  Chest x-ray insistent with pneumonia.  Patient appears well and is staying well hydrated. Patient feels comfortable going home and does not wish to stay in the hospital.  She appears well. She is agreeable to see her primary care provider on Friday. She was given IM ceftriaxone in ED. Patient will be discharged home with prescriptions for azithromycin and Tamiflu. Patient is to follow up with PCP as needed or otherwise directed. Patient is given ED precautions to return to the ED for any worsening or new symptoms.     ____________________________________________  FINAL CLINICAL IMPRESSION(S) / ED DIAGNOSES  Final diagnoses:  Community acquired pneumonia of left lower lobe of lung (HCC)  Influenza A      NEW MEDICATIONS STARTED DURING THIS VISIT:  ED Discharge Orders        Ordered    azithromycin (ZITHROMAX Z-PAK) 250 MG tablet     05/05/17 2119    oseltamivir (TAMIFLU) 75 MG capsule  2 times daily     05/05/17  2119    benzonatate (TESSALON PERLES) 100 MG capsule  3 times daily PRN     05/05/17 2119          This chart was dictated using voice recognition software/Dragon. Despite best efforts to proofread, errors can occur which can change the meaning. Any change was purely unintentional.    Enid Derry, PA-C 05/05/17 2346    Don Perking, [place], MD 05/09/17 1536

## 2017-05-05 NOTE — ED Triage Notes (Signed)
Pt dx "infection" on Monday, upper resp and put ceftin. Symptoms started on Monday and was not checked for the flu. Pt having cough, and body aches.

## 2017-05-07 ENCOUNTER — Encounter: Payer: Self-pay | Admitting: Family Medicine

## 2017-05-07 ENCOUNTER — Ambulatory Visit (INDEPENDENT_AMBULATORY_CARE_PROVIDER_SITE_OTHER): Payer: 59 | Admitting: Family Medicine

## 2017-05-07 VITALS — BP 130/80 | HR 80 | Temp 98.6°F | Resp 12 | Ht 64.0 in | Wt 207.0 lb

## 2017-05-07 DIAGNOSIS — J181 Lobar pneumonia, unspecified organism: Secondary | ICD-10-CM | POA: Diagnosis not present

## 2017-05-07 DIAGNOSIS — J189 Pneumonia, unspecified organism: Secondary | ICD-10-CM

## 2017-05-07 NOTE — Progress Notes (Signed)
Name: Susan Gregory   MRN: [id number]    DOB: [birth year]   Date:05/07/2017       Progress Note  Subjective  Chief Complaint  Chief Complaint  Patient presents with  . Follow-up    Dx with flu and pneumonia- still taking antibiotics as well as Tamiflu- feeling better today than she has felt all week    Pneumonia  She complains of cough and wheezing. There is no chest tightness, difficulty breathing, frequent throat clearing, hemoptysis, hoarse voice, shortness of breath or sputum production. Primary symptoms comments: May be stridor. The current episode started in the past 7 days. The problem occurs intermittently. The problem has been waxing and waning. The cough is non-productive. Pertinent negatives include no appetite change, chest pain, dyspnea on exertion, ear congestion, ear pain, fever, headaches, heartburn, malaise/fatigue, myalgias, nasal congestion, orthopnea, PND, postnasal drip, rhinorrhea, sneezing, sore throat, sweats, trouble swallowing or weight loss. Her symptoms are aggravated by nothing. She reports moderate improvement on treatment.    No problem-specific Assessment & Plan notes found for this encounter.   Past Medical History:  Diagnosis Date  . Allergy   . Barrett's esophagus   . GERD (gastroesophageal reflux disease)   . Hyperlipidemia   . Hypertension     Past Surgical History:  Procedure Laterality Date  . BREAST BIOPSY Right 09/04/2016   US guided right breast biopsy. Coil marker  . BREAST BIOPSY Right 09/07/2016   stereo biopsy. Ribbon marker  . ESOPHAGOGASTRODUODENOSCOPY (EGD) WITH PROPOFOL N/A 04/20/2016   Procedure: ESOPHAGOGASTRODUODENOSCOPY (EGD) WITH PROPOFOL;  Surgeon: Christena Deem, MD;  Location: [facility] ENDOSCOPY;  Service: Endoscopy;  Laterality: N/A;  . THROAT SURGERY     nodules taken off throat  . TUBAL LIGATION      Family History  Problem Relation Age of Onset  . Breast cancer Cousin     Social History   Socioeconomic History   . Marital status: Married    Spouse name: Not on file  . Number of children: Not on file  . Years of education: Not on file  . Highest education level: Not on file  Occupational History  . Not on file  Social Needs  . Financial resource strain: Not on file  . Food insecurity:    Worry: Not on file    Inability: Not on file  . Transportation needs:    Medical: Not on file    Non-medical: Not on file  Tobacco Use  . Smoking status: Never Smoker  . Smokeless tobacco: Never Used  Substance and Sexual Activity  . Alcohol use: No    Alcohol/week: 0.0 oz  . Drug use: No  . Sexual activity: Not Currently  Lifestyle  . Physical activity:    Days per week: Not on file    Minutes per session: Not on file  . Stress: Not on file  Relationships  . Social connections:    Talks on phone: Not on file    Gets together: Not on file    Attends religious service: Not on file    Active member of club or organization: Not on file    Attends meetings of clubs or organizations: Not on file    Relationship status: Not on file  . Intimate partner violence:    Fear of current or ex partner: Not on file    Emotionally abused: Not on file    Physically abused: Not on file    Forced sexual activity: Not on file  Other Topics Concern  . Not on file  Social History Narrative  . Not on file    No Known Allergies  Outpatient Medications Prior to Visit  Medication Sig Dispense Refill  . albuterol (PROAIR HFA) 108 (90 Base) MCG/ACT inhaler 2 puffs q 6 hr prn wheezing 1 Inhaler 1  . aspirin 81 MG tablet Take 1 tablet by mouth daily.    Marland Kitchen azithromycin (ZITHROMAX Z-PAK) 250 MG tablet Take 2 tablets (500 mg) on  Day 1,  followed by 1 tablet (250 mg) once daily on Days 2 through 5. 6 each 0  . benzonatate (TESSALON PERLES) 100 MG capsule Take 1 capsule (100 mg total) by mouth 3 (three) times daily as needed for cough. 30 capsule 0  . cefUROXime (CEFTIN) 250 MG tablet Take 1 tablet (250 mg total) by  mouth 2 (two) times daily with a meal. 20 tablet 1  . celecoxib (CELEBREX) 200 MG capsule TAKE 1 CAPSULE BY MOUTH EVERY DAY 30 capsule 2  . clobetasol (TEMOVATE) 0.05 % external solution Apply 1 application topically 2 (two) times daily. Dr Cheree Ditto    . fluticasone [facility]) 50 MCG/ACT nasal spray Place 2 sprays into both nostrils daily. 16 g 6  . griseofulvin (GRIFULVIN V) 500 MG tablet Take 500 mg by mouth 2 (two) times daily. Dr Cheree Ditto  5  . guaiFENesin-codeine (ROBITUSSIN AC) 100-10 MG/5ML syrup Take 5 mLs by mouth 3 (three) times daily as needed for cough. 150 mL 0  . hydrochlorothiazide (MICROZIDE) 12.5 MG capsule Take 1 capsule (12.5 mg total) by mouth daily. 90 capsule 1  . ketoconazole (NIZORAL) 2 % shampoo Apply 1 application topically as needed. Dr Cheree Ditto 120 mL 1  . losartan (COZAAR) 50 MG tablet Take 1 tablet (50 mg total) by mouth daily. 90 tablet 1  . oseltamivir (TAMIFLU) 75 MG capsule Take 1 capsule (75 mg total) by mouth 2 (two) times daily for 5 days. 10 capsule 0  . pantoprazole (PROTONIX) 40 MG tablet Take 1 tablet (40 mg total) by mouth daily. Dr Marva Panda 90 tablet 1  . simvastatin (ZOCOR) 20 MG tablet Take 1 tablet (20 mg total) by mouth daily. 90 tablet 2   No facility-administered medications prior to visit.     Review of Systems  Constitutional: Negative for appetite change, chills, fever, malaise/fatigue and weight loss.  HENT: Negative for ear discharge, ear pain, hoarse voice, postnasal drip, rhinorrhea, sneezing, sore throat and trouble swallowing.   Eyes: Negative for blurred vision.  Respiratory: Positive for cough and wheezing. Negative for hemoptysis, sputum production and shortness of breath.   Cardiovascular: Negative for chest pain, dyspnea on exertion, palpitations, leg swelling and PND.  Gastrointestinal: Negative for abdominal pain, blood in stool, constipation, diarrhea, heartburn, melena and nausea.  Genitourinary: Negative for dysuria, frequency,  hematuria and urgency.  Musculoskeletal: Negative for back pain, joint pain, myalgias and neck pain.  Skin: Negative for rash.  Neurological: Negative for dizziness, tingling, sensory change, focal weakness and headaches.  Endo/Heme/Allergies: Negative for environmental allergies and polydipsia. Does not bruise/bleed easily.  Psychiatric/Behavioral: Negative for depression and suicidal ideas. The patient is not nervous/anxious and does not have insomnia.      Objective  Vitals:   05/07/17 1459  BP: 130/80  Pulse: 80  Resp: 12  Temp: 98.6 F (37 C)  TempSrc: Oral  SpO2: 98%  Weight: 207 lb (93.9 kg)  Height: 5\' 4"  (1.626 m)    Physical Exam  Constitutional: She is well-developed, well-nourished,  and in no distress. No distress.  HENT:  Head: Normocephalic and atraumatic.  Right Ear: External ear normal.  Left Ear: External ear normal.  Nose: Nose normal.  Mouth/Throat: Oropharynx is clear and moist.  Eyes: Pupils are equal, round, and reactive to light. Conjunctivae and EOM are normal. Right eye exhibits no discharge. Left eye exhibits no discharge.  Neck: Normal range of motion. Neck supple. No JVD present. No thyromegaly present.  Cardiovascular: Normal rate, regular rhythm, normal heart sounds and intact distal pulses. Exam reveals no gallop and no friction rub.  No murmur heard. Pulmonary/Chest: Effort normal. She has decreased breath sounds in the right lower field. She has no wheezes. She has rales in the right lower field.  Abdominal: Soft. Bowel sounds are normal. She exhibits no mass. There is no tenderness. There is no guarding.  Musculoskeletal: Normal range of motion. She exhibits no edema.  Lymphadenopathy:    She has no cervical adenopathy.  Neurological: She is alert. She has normal reflexes.  Skin: Skin is warm and dry. She is not diaphoretic.  Psychiatric: Mood and affect normal.  Nursing note and vitals reviewed.     Assessment & Plan  Problem List  Items Addressed This Visit    None    Visit Diagnoses    Pneumonia of right lower lobe due to infectious organism [facility](HCC)    -  Primary   continue ceftin/azith/tamiflu      No orders of the defined types were placed in this encounter.     Dr. Hayden Rasmusseneanna Jones [facility]  05/07/17

## 2017-05-24 ENCOUNTER — Other Ambulatory Visit: Payer: Self-pay

## 2017-05-27 ENCOUNTER — Ambulatory Visit: Payer: 59

## 2017-05-27 ENCOUNTER — Other Ambulatory Visit
Admission: RE | Admit: 2017-05-27 | Discharge: 2017-05-27 | Disposition: A | Discharge status: Home / Self Care | Payer: 59 | Source: Ambulatory Visit | Attending: Family Medicine | Admitting: Family Medicine

## 2017-05-27 ENCOUNTER — Ambulatory Visit
Admission: RE | Admit: 2017-05-27 | Discharge: 2017-05-27 | Disposition: A | Discharge status: Home / Self Care | Payer: 59 | Source: Ambulatory Visit | Attending: Family Medicine | Admitting: Family Medicine

## 2017-05-27 DIAGNOSIS — R918 Other nonspecific abnormal finding of lung field: Secondary | ICD-10-CM | POA: Diagnosis not present

## 2017-05-27 DIAGNOSIS — J13 Pneumonia due to Streptococcus pneumoniae: Secondary | ICD-10-CM

## 2017-07-16 ENCOUNTER — Other Ambulatory Visit: Payer: Self-pay | Admitting: Family Medicine

## 2017-07-16 DIAGNOSIS — J4521 Mild intermittent asthma with (acute) exacerbation: Secondary | ICD-10-CM

## 2017-08-03 ENCOUNTER — Other Ambulatory Visit: Payer: Self-pay | Admitting: Family Medicine

## 2017-08-03 DIAGNOSIS — Z1231 Encounter for screening mammogram for malignant neoplasm of breast: Secondary | ICD-10-CM

## 2017-08-10 ENCOUNTER — Other Ambulatory Visit: Payer: Self-pay | Admitting: Family Medicine

## 2017-08-10 DIAGNOSIS — J4521 Mild intermittent asthma with (acute) exacerbation: Secondary | ICD-10-CM

## 2017-08-15 ENCOUNTER — Emergency Department
Admission: EM | Admit: 2017-08-15 | Discharge: 2017-08-15 | Disposition: A | Discharge status: Home / Self Care | Payer: 59 | Attending: Emergency Medicine | Admitting: Emergency Medicine

## 2017-08-15 ENCOUNTER — Other Ambulatory Visit: Payer: Self-pay

## 2017-08-15 DIAGNOSIS — I10 Essential (primary) hypertension: Secondary | ICD-10-CM | POA: Diagnosis not present

## 2017-08-15 DIAGNOSIS — R040 Epistaxis: Secondary | ICD-10-CM | POA: Diagnosis present

## 2017-08-15 DIAGNOSIS — Z79899 Other long term (current) drug therapy: Secondary | ICD-10-CM | POA: Insufficient documentation

## 2017-08-15 DIAGNOSIS — Z7982 Long term (current) use of aspirin: Secondary | ICD-10-CM | POA: Diagnosis not present

## 2017-08-15 DIAGNOSIS — E785 Hyperlipidemia, unspecified: Secondary | ICD-10-CM | POA: Insufficient documentation

## 2017-08-15 LAB — COMPREHENSIVE METABOLIC PANEL
ALBUMIN: 4.2 g/dL (ref 3.5–5.0)
ALK PHOS: 61 U/L (ref 38–126)
ALT: 17 U/L (ref 0–44)
ANION GAP: 7 (ref 5–15)
AST: 19 U/L (ref 15–41)
BUN: 19 mg/dL (ref 6–20)
CALCIUM: 9.3 mg/dL (ref 8.9–10.3)
CO2: 29 mmol/L (ref 22–32)
CREATININE: 0.81 mg/dL (ref 0.44–1.00)
Chloride: 104 mmol/L (ref 98–111)
GFR calc Af Amer: >60 mL/min (ref >60)
GFR calc non Af Amer: >60 mL/min (ref >60)
GLUCOSE: 94 mg/dL (ref 70–99)
Potassium: 3.8 mmol/L (ref 3.5–5.1)
Sodium: 140 mmol/L (ref 135–145)
Total Bilirubin: 0.7 mg/dL (ref 0.3–1.2)
Total Protein: 7.9 g/dL (ref 6.5–8.1)

## 2017-08-15 LAB — CBC WITH DIFFERENTIAL/PLATELET
BASOS PCT: 0 %
Basophils Absolute: 0 10*3/uL (ref 0–0.1)
Eosinophils Absolute: 0.2 10*3/uL (ref 0–0.7)
Eosinophils Relative: 6 %
HEMATOCRIT: 42.2 % (ref 35.0–47.0)
HEMOGLOBIN: 14.5 g/dL (ref 12.0–16.0)
Lymphocytes Relative: 29 %
Lymphs Abs: 1 10*3/uL (ref 1.0–3.6)
MCH: 29.7 pg (ref 26.0–34.0)
MCHC: 34.2 g/dL (ref 32.0–36.0)
MCV: 86.6 fL (ref 80.0–100.0)
MONO ABS: 0.5 10*3/uL (ref 0.2–0.9)
Monocytes Relative: 15 %
NEUTROS ABS: 1.7 10*3/uL (ref 1.4–6.5)
NEUTROS PCT: 50 %
Platelets: 181 10*3/uL (ref 150–440)
RBC: 4.88 MIL/uL (ref 3.80–5.20)
RDW: 14.1 % (ref 11.5–14.5)
WBC: 3.5 10*3/uL — ABNORMAL LOW (ref 3.6–11.0)

## 2017-08-15 LAB — TROPONIN I
Troponin I: 0.03 ng/mL (ref <0.03)
Troponin I: <0.03 ng/mL (ref <0.03)

## 2017-08-15 MED ORDER — HYDROCHLOROTHIAZIDE 25 MG PO TABS: 25.0000 mg | ORAL_TABLET | Freq: Every day | ORAL | 1 refills | Status: DC

## 2017-08-15 MED ORDER — SILVER NITRATE-POT NITRATE 75-25 % EX MISC
1.0000 "application " | Freq: Once | CUTANEOUS | Status: AC
  Administered 2017-08-15: 1 via TOPICAL
  Filled 2017-08-15: qty 1

## 2017-08-15 MED ORDER — LIDOCAINE VISCOUS HCL 2 % MT SOLN
15.0000 mL | Freq: Once | OROMUCOSAL | Status: AC
  Administered 2017-08-15: 15 mL via OROMUCOSAL
  Filled 2017-08-15: qty 15

## 2017-08-15 NOTE — ED Notes (Signed)
Pt's BP remains elevated at 153/106 at d/c. Dr. Mayford KnifeWilliams made aware and pt was cleared for d/c with instructions to follow up closely with her PCP.

## 2017-08-15 NOTE — ED Notes (Signed)
See Triage note 

## 2017-08-15 NOTE — ED Triage Notes (Signed)
Pt states that she has been having intermittent nose bleeds for the past few days, states that she has been checking her bp for correlation and reports that she has been hypertensive even with taking her bp meds

## 2017-08-15 NOTE — ED Provider Notes (Signed)
[facility] Emergency Department Provider Note       Time seen: ----------------------------------------- 9:11 AM on 08/15/2017 -----------------------------------------   I have reviewed the triage vital signs and the nursing notes.  HISTORY   Chief Complaint Hypertension and Epistaxis    HPI Susan StarchRoberta R Hinkley is a 57 y.o. female with a history of Barrett's esophagus, GERD, hyperlipidemia and hypertension who presents to the ED for intermittent nosebleeds for the past few days as well as hypertension.  Patient's been checking her blood pressure and it has been elevated.  Typically she states her blood pressure has been under control.  She does not normally have trouble with nosebleeds.  She denies recent illness or other complaints.  Past Medical History:  Diagnosis Date  . Allergy   . Barrett's esophagus   . GERD (gastroesophageal reflux disease)   . Hyperlipidemia   . Hypertension     Patient Active Problem List   Diagnosis Date Noted  . Familial multiple lipoprotein-type hyperlipidemia 07/18/2014  . Disorder of lipoprotein and lipid metabolism 07/18/2014  . Benign hypertension 07/18/2014  . Acid reflux 07/18/2014  . Encounter for general adult medical examination without abnormal findings 07/18/2014  . Neck sprain and strain 07/18/2014  . Abnormal breast finding 07/18/2014  . Abnormal finding on breast imaging 07/18/2014  . Arthritis of knee, degenerative 07/18/2014  . TB skin/subcutaneous 07/18/2014  . Fecal occult blood test positive 11/01/2013    Past Surgical History:  Procedure Laterality Date  . BREAST BIOPSY Right 09/04/2016   US guided right breast biopsy. Coil marker  . BREAST BIOPSY Right 09/07/2016   stereo biopsy. Ribbon marker  . ESOPHAGOGASTRODUODENOSCOPY (EGD) WITH PROPOFOL N/A 04/20/2016   Procedure: ESOPHAGOGASTRODUODENOSCOPY (EGD) WITH PROPOFOL;  Surgeon: Christena DeemMartin U Skulskie, MD;  [facility];  Service: Endoscopy;   Laterality: N/A;  . THROAT SURGERY     nodules taken off throat  . TUBAL LIGATION      Allergies Patient has no known allergies.  Social History Social History   Tobacco Use  . Smoking status: Never Smoker  . Smokeless tobacco: Never Used  Substance Use Topics  . Alcohol use: No    Alcohol/week: 0.0 oz  . Drug use: No   Review of Systems Constitutional: Negative for fever. ENT: Positive for nosebleeds Cardiovascular: Negative for chest pain. Respiratory: Negative for shortness of breath. Gastrointestinal: Negative for abdominal pain Musculoskeletal: Negative for back pain. Skin: Negative for rash. Neurological: Negative for headaches, focal weakness or numbness.  All systems negative/normal/unremarkable except as stated in the HPI  ____________________________________________   PHYSICAL EXAM:  VITAL SIGNS: ED Triage Vitals  Enc Vitals Group     BP 08/15/17 0907 (!) 159/109     Pulse Rate 08/15/17 0907 68     Resp 08/15/17 0907 16     Temp 08/15/17 0907 97.9 F (36.6 C)     Temp Source 08/15/17 0907 Oral     SpO2 08/15/17 0907 99 %     Weight 08/15/17 0908 200 lb (90.7 kg)     Height 08/15/17 0908 5\' 4"  (1.626 m)     Head Circumference --      Peak Flow --      Pain Score 08/15/17 0908 0     Pain Loc --      Pain Edu? --      Excl. in GC? --    Constitutional: Alert and oriented. Well appearing and in no distress. Eyes: Conjunctivae are normal. Normal extraocular movements.  ENT   Head: Normocephalic and atraumatic.   Nose: No congestion/rhinnorhea.  Recent bleeding from the left anterior naris, Kiesselbach's plexus   Mouth/Throat: Mucous membranes are moist.   Neck: No stridor. Cardiovascular: Normal rate, regular rhythm. No murmurs, rubs, or gallops. Respiratory: Normal respiratory effort without tachypnea nor retractions. Breath sounds are clear and equal bilaterally. No wheezes/rales/rhonchi. Musculoskeletal: Nontender with normal range  of motion in extremities. No lower extremity tenderness nor edema. Neurologic:  Normal speech and language. No gross focal neurologic deficits are appreciated.  Skin:  Skin is warm, dry and intact. No rash noted. ____________________________________________  EKG: Interpreted by me, sinus rhythm the rate of 62 bpm, normal PR interval, normal QRS size, normal QT, normal axis  ED COURSE:  As part of my medical decision making, I reviewed the following data within the electronic MEDICAL RECORD NUMBER History obtained from family if available, nursing notes, old chart and ekg, as well as notes from prior ED visits. Patient presented for nosebleeds and hypertension, we will assess with labs and imaging as indicated at this time.   Marland KitchenEpistaxis Management Date/Time: 08/15/2017 11:00 AM Performed by: Emily Filbert, MD Authorized by: Emily Filbert, MD   Consent:    Consent obtained:  Verbal   Consent given by:  Patient   Risks discussed:  Bleeding Procedure details:    Treatment site:  L anterior   Treatment method:  Silver nitrate   Treatment complexity:  Limited Post-procedure details:    Assessment:  Bleeding stopped   Patient tolerance of procedure:  Tolerated well, no immediate complications   ____________________________________________   LABS (pertinent positives/negatives)  Labs Reviewed  CBC WITH DIFFERENTIAL/PLATELET - Abnormal; Notable for the following components:      Result Value   WBC 3.5 (*)    All other components within normal limits  TROPONIN I - Abnormal; Notable for the following components:   Troponin I 0.03 (*)    All other components within normal limits  COMPREHENSIVE METABOLIC PANEL  TROPONIN I  ____________________________________________  DIFFERENTIAL DIAGNOSIS   Epistaxis, hypertension, anemia, thrombocytopenia  FINAL ASSESSMENT AND PLAN  Epistaxis, hypertension, elevated troponin   Plan: The patient had presented for recent epistaxis  and hypertension. Patient's labs did surprisingly reveal a troponin elevation of 0.03, this was subsequently rechecked and was negative.  Unsure of the significance of this.  Again she did have cauterization of her nasal septum with no further bleeding.  I will increase her HCTZ.  She is cleared for close outpatient follow-up   Ulice Dash, MD   Note: This note was generated in part or whole with voice recognition software. Voice recognition is usually quite accurate but there are transcription errors that can and very often do occur. I apologize for any typographical errors that were not detected and corrected.     Emily Filbert, MD 08/15/17 1141

## 2017-08-23 ENCOUNTER — Encounter: Payer: Self-pay | Admitting: Family Medicine

## 2017-08-23 ENCOUNTER — Ambulatory Visit (INDEPENDENT_AMBULATORY_CARE_PROVIDER_SITE_OTHER): Payer: 59 | Admitting: Family Medicine

## 2017-08-23 DIAGNOSIS — I10 Essential (primary) hypertension: Secondary | ICD-10-CM

## 2017-08-23 DIAGNOSIS — K219 Gastro-esophageal reflux disease without esophagitis: Secondary | ICD-10-CM

## 2017-08-23 DIAGNOSIS — E7849 Other hyperlipidemia: Secondary | ICD-10-CM

## 2017-08-23 MED ORDER — SIMVASTATIN 20 MG PO TABS: 20.0000 mg | ORAL_TABLET | Freq: Every day | ORAL | 1 refills | Status: DC

## 2017-08-23 MED ORDER — HYDROCHLOROTHIAZIDE 25 MG PO TABS: 25.0000 mg | ORAL_TABLET | Freq: Every day | ORAL | 1 refills | Status: DC

## 2017-08-23 MED ORDER — LOSARTAN POTASSIUM 50 MG PO TABS: 50.0000 mg | ORAL_TABLET | Freq: Every day | ORAL | 1 refills | Status: DC

## 2017-08-23 MED ORDER — AMLODIPINE BESYLATE 5 MG PO TABS: 5.0000 mg | ORAL_TABLET | Freq: Every day | ORAL | 1 refills | Status: DC

## 2017-08-23 NOTE — Assessment & Plan Note (Signed)
Added Amlodipine 5 mg to regimen- uncontrolled despite increase in HCTZ/ recheck in 6 weeks

## 2017-08-23 NOTE — Assessment & Plan Note (Signed)
Continue simvastatin- stable on med

## 2017-08-23 NOTE — Progress Notes (Signed)
Name: Susan Gregory   MRN: [id number]    DOB: April 22, 1960   Date:08/23/2017       Progress Note  Subjective  Chief Complaint  Chief Complaint  Patient presents with  . Hypertension    b/p recheck due to nose bleed- increased HCTZ to 25mg  and cont Losartan as directed- told to follow up with pcp    Hypertension  This is a chronic (new elevation of bp) problem. The current episode started more than 1 year ago. The problem is unchanged. The problem is controlled. Pertinent negatives include no anxiety, blurred vision, chest pain, headaches, malaise/fatigue, neck pain, orthopnea, palpitations, peripheral edema, PND, shortness of breath or sweats. There are no associated agents to hypertension. Risk factors for coronary artery disease include dyslipidemia and post-menopausal state. Past treatments include angiotensin blockers and diuretics. The current treatment provides no improvement. There are no compliance problems.  There is no history of angina, kidney disease, CAD/MI, CVA, heart failure, left ventricular hypertrophy, PVD or retinopathy. There is no history of chronic renal disease, a hypertension causing med or renovascular disease.    Benign hypertension Added Amlodipine 5 mg to regimen- uncontrolled despite increase in HCTZ/ recheck in 6 weeks  Familial multiple lipoprotein-type hyperlipidemia Continue simvastatin- stable on med   Past Medical History:  Diagnosis Date  . Allergy   . Barrett's esophagus   . GERD (gastroesophageal reflux disease)   . Hyperlipidemia   . Hypertension     Past Surgical History:  Procedure Laterality Date  . BREAST BIOPSY Right 09/04/2016   US guided right breast biopsy. Coil marker  . BREAST BIOPSY Right 09/07/2016   stereo biopsy. Ribbon marker  . ESOPHAGOGASTRODUODENOSCOPY (EGD) WITH PROPOFOL N/A 04/20/2016   Procedure: ESOPHAGOGASTRODUODENOSCOPY (EGD) WITH PROPOFOL;  Surgeon: Christena Deem, MD;  Location: [facility] ENDOSCOPY;  Service: Endoscopy;   Laterality: N/A;  . THROAT SURGERY     nodules taken off throat  . TUBAL LIGATION      Family History  Problem Relation Age of Onset  . Breast cancer Cousin     Social History   Socioeconomic History  . Marital status: Married    Spouse name: Not on file  . Number of children: Not on file  . Years of education: Not on file  . Highest education level: Not on file  Occupational History  . Not on file  Social Needs  . Financial resource strain: Not on file  . Food insecurity:    Worry: Not on file    Inability: Not on file  . Transportation needs:    Medical: Not on file    Non-medical: Not on file  Tobacco Use  . Smoking status: Never Smoker  . Smokeless tobacco: Never Used  Substance and Sexual Activity  . Alcohol use: No    Alcohol/week: 0.0 oz  . Drug use: No  . Sexual activity: Not Currently  Lifestyle  . Physical activity:    Days per week: Not on file    Minutes per session: Not on file  . Stress: Not on file  Relationships  . Social connections:    Talks on phone: Not on file    Gets together: Not on file    Attends religious service: Not on file    Active member of club or organization: Not on file    Attends meetings of clubs or organizations: Not on file    Relationship status: Not on file  . Intimate partner violence:    Fear of  current or ex partner: Not on file    Emotionally abused: Not on file    Physically abused: Not on file    Forced sexual activity: Not on file  Other Topics Concern  . Not on file  Social History Narrative  . Not on file    No Known Allergies  Outpatient Medications Prior to Visit  Medication Sig Dispense Refill  . albuterol (PROVENTIL HFA;VENTOLIN HFA) 108 (90 Base) MCG/ACT inhaler INHALE 2 PUFFS EVERY 6 HOURS AS NEEDED WHEEZING 8.5 Inhaler 1  . aspirin 81 MG tablet Take 1 tablet by mouth daily.    . celecoxib (CELEBREX) 200 MG capsule TAKE 1 CAPSULE BY MOUTH EVERY DAY 30 capsule 2  . clobetasol (TEMOVATE) 0.05 %  external solution Apply 1 application topically 2 (two) times daily.     Marland Kitchen griseofulvin (GRIFULVIN V) 500 MG tablet Take 500 mg by mouth 2 (two) times daily. Dr Cheree Ditto  5  . ketoconazole (NIZORAL) 2 % shampoo Apply 1 application topically as needed. Dr Cheree Ditto 120 mL 1  . pantoprazole (PROTONIX) 40 MG tablet Take 1 tablet (40 mg total) by mouth daily. Dr Marva Panda (Patient taking differently: Take 40 mg by mouth 2 (two) times daily. ) 90 tablet 1  . hydrochlorothiazide (HYDRODIURIL) 25 MG tablet Take 1 tablet (25 mg total) by mouth daily. 30 tablet 1  . losartan (COZAAR) 50 MG tablet Take 1 tablet (50 mg total) by mouth daily. 90 tablet 1  . simvastatin (ZOCOR) 20 MG tablet Take 1 tablet (20 mg total) by mouth daily. 90 tablet 2  . fluticasone (FLONASE) 50 MCG/ACT nasal spray Place 2 sprays into both nostrils daily. 16 g 6   No facility-administered medications prior to visit.     Review of Systems  Constitutional: Negative for chills, fever, malaise/fatigue and weight loss.  HENT: Negative for ear discharge, ear pain and sore throat.   Eyes: Negative for blurred vision.  Respiratory: Negative for cough, sputum production, shortness of breath and wheezing.   Cardiovascular: Negative for chest pain, palpitations, orthopnea, leg swelling and PND.  Gastrointestinal: Negative for abdominal pain, blood in stool, constipation, diarrhea, heartburn, melena and nausea.  Genitourinary: Negative for dysuria, frequency, hematuria and urgency.  Musculoskeletal: Negative for back pain, joint pain, myalgias and neck pain.  Skin: Negative for rash.  Neurological: Negative for dizziness, tingling, sensory change, focal weakness and headaches.  Endo/Heme/Allergies: Negative for environmental allergies and polydipsia. Does not bruise/bleed easily.  Psychiatric/Behavioral: Negative for depression and suicidal ideas. The patient is not nervous/anxious and does not have insomnia.      Objective  Vitals:    08/23/17 0910  BP: (!) 122/98  Pulse: 80  Weight: 209 lb (94.8 kg)  Height: 5\' 4"  (1.626 m)    Physical Exam  Constitutional: She is oriented to person, place, and time. She appears well-developed and well-nourished.  HENT:  Head: Normocephalic.  Right Ear: External ear normal.  Left Ear: External ear normal.  Mouth/Throat: Oropharynx is clear and moist.  Eyes: Pupils are equal, round, and reactive to light. Conjunctivae and EOM are normal. Lids are everted and swept, no foreign bodies found. Left eye exhibits no hordeolum. No foreign body present in the left eye. Right conjunctiva is not injected. Left conjunctiva is not injected. No scleral icterus.  Neck: Normal range of motion. Neck supple. No JVD present. No tracheal deviation present. No thyromegaly present.  Cardiovascular: Normal rate, regular rhythm, normal heart sounds and intact distal pulses. Exam reveals no  gallop and no friction rub.  No murmur heard. Pulmonary/Chest: Effort normal and breath sounds normal. No respiratory distress. She has no wheezes. She has no rales.  Abdominal: Soft. Bowel sounds are normal. She exhibits no mass. There is no hepatosplenomegaly. There is no tenderness. There is no rebound and no guarding.  Musculoskeletal: Normal range of motion. She exhibits no edema or tenderness.  Lymphadenopathy:    She has no cervical adenopathy.  Neurological: She is alert and oriented to person, place, and time. She has normal strength. She displays normal reflexes. No cranial nerve deficit.  Skin: Skin is warm. No rash noted.  Psychiatric: She has a normal mood and affect. Her mood appears not anxious. She does not exhibit a depressed mood.  Nursing note and vitals reviewed.     Assessment & Plan  Problem List Items Addressed This Visit      Cardiovascular and Mediastinum   Benign hypertension    Added Amlodipine 5 mg to regimen- uncontrolled despite increase in HCTZ/ recheck in 6 weeks      Relevant  Medications   losartan (COZAAR) 50 MG tablet   simvastatin (ZOCOR) 20 MG tablet   hydrochlorothiazide (HYDRODIURIL) 25 MG tablet   amLODipine (NORVASC) 5 MG tablet     Digestive   Acid reflux     Other   Familial multiple lipoprotein-type hyperlipidemia    Continue simvastatin- stable on med      Relevant Medications   losartan (COZAAR) 50 MG tablet   simvastatin (ZOCOR) 20 MG tablet   hydrochlorothiazide (HYDRODIURIL) 25 MG tablet   amLODipine (NORVASC) 5 MG tablet      Meds ordered this encounter  Medications  . losartan (COZAAR) 50 MG tablet    Sig: Take 1 tablet (50 mg total) by mouth daily.    Dispense:  90 tablet    Refill:  1  . simvastatin (ZOCOR) 20 MG tablet    Sig: Take 1 tablet (20 mg total) by mouth daily.    Dispense:  90 tablet    Refill:  1    sched appt  . hydrochlorothiazide (HYDRODIURIL) 25 MG tablet    Sig: Take 1 tablet (25 mg total) by mouth daily.    Dispense:  90 tablet    Refill:  1  . amLODipine (NORVASC) 5 MG tablet    Sig: Take 1 tablet (5 mg total) by mouth daily.    Dispense:  90 tablet    Refill:  1  pt will continue pantoprazole as prescribed by Dr Marva PandaSkulskie    Dr. Elizabeth Sauereanna Nathaniel Yaden [facility]  08/23/17

## 2017-08-30 ENCOUNTER — Ambulatory Visit
Admission: RE | Admit: 2017-08-30 | Discharge: 2017-08-30 | Disposition: A | Discharge status: Home / Self Care | Payer: 59 | Source: Ambulatory Visit | Attending: Family Medicine | Admitting: Family Medicine

## 2017-08-30 DIAGNOSIS — Z1231 Encounter for screening mammogram for malignant neoplasm of breast: Secondary | ICD-10-CM | POA: Insufficient documentation

## 2017-09-04 ENCOUNTER — Other Ambulatory Visit: Payer: Self-pay | Admitting: Family Medicine

## 2017-09-04 DIAGNOSIS — M1712 Unilateral primary osteoarthritis, left knee: Secondary | ICD-10-CM

## 2017-09-04 DIAGNOSIS — M79662 Pain in left lower leg: Secondary | ICD-10-CM

## 2017-10-04 ENCOUNTER — Encounter: Payer: Self-pay | Admitting: Family Medicine

## 2017-10-04 ENCOUNTER — Ambulatory Visit (INDEPENDENT_AMBULATORY_CARE_PROVIDER_SITE_OTHER): Payer: 59 | Admitting: Family Medicine

## 2017-10-04 VITALS — BP 130/84 | HR 80 | Ht 64.0 in | Wt 211.0 lb

## 2017-10-04 DIAGNOSIS — I10 Essential (primary) hypertension: Secondary | ICD-10-CM | POA: Diagnosis not present

## 2017-10-04 DIAGNOSIS — J0100 Acute maxillary sinusitis, unspecified: Secondary | ICD-10-CM

## 2017-10-04 DIAGNOSIS — Z23 Encounter for immunization: Secondary | ICD-10-CM

## 2017-10-04 MED ORDER — AMLODIPINE BESYLATE 5 MG PO TABS: 5.0000 mg | ORAL_TABLET | Freq: Every day | ORAL | 1 refills | Status: DC

## 2017-10-04 MED ORDER — AMOXICILLIN 500 MG PO CAPS: 500.0000 mg | ORAL_CAPSULE | Freq: Three times a day (TID) | ORAL | 0 refills | Status: DC

## 2017-10-04 MED ORDER — HYDROCHLOROTHIAZIDE 25 MG PO TABS: 25.0000 mg | ORAL_TABLET | Freq: Every day | ORAL | 1 refills | Status: DC

## 2017-10-04 MED ORDER — LOSARTAN POTASSIUM 50 MG PO TABS: 50.0000 mg | ORAL_TABLET | Freq: Every day | ORAL | 1 refills | Status: DC

## 2017-10-04 NOTE — Progress Notes (Signed)
Name: Susan Gregory   MRN: [id number]    DOB: 1960/07/31   Date:10/04/2017       Progress Note  Subjective  Chief Complaint  Chief Complaint  Patient presents with  . Hypertension    increased B/P med and added Amlodipine  . Sinusitis    drainage, bad taste in mouth, cong    Hypertension  This is a chronic problem. The current episode started more than 1 year ago. The problem has been waxing and waning since onset. The problem is controlled. Pertinent negatives include no anxiety, blurred vision, chest pain, headaches, malaise/fatigue, neck pain, orthopnea, palpitations, peripheral edema, PND, shortness of breath or sweats. There are no associated agents to hypertension. Risk factors for coronary artery disease include dyslipidemia. Past treatments include calcium channel blockers, angiotensin blockers and diuretics. The current treatment provides moderate improvement. There are no compliance problems.  There is no history of angina, kidney disease, CVA, heart failure, left ventricular hypertrophy, PVD or retinopathy. There is no history of chronic renal disease, a hypertension causing med or renovascular disease.  Sinusitis  This is a new problem. The current episode started in the past 7 days (Thursday). The problem has been gradually worsening since onset. There has been no fever. Associated symptoms include congestion, a hoarse voice, sinus pressure, sneezing and a sore throat. Pertinent negatives include no chills, coughing, diaphoresis, ear pain, headaches, neck pain, shortness of breath or swollen glands. Past treatments include oral decongestants. The treatment provided mild relief.    Benign hypertension Recent increase on HCTZ to 25mg  and losartan 50mg . Added Amlodipine at last visit. Rechecked B/P today- was within normal range. Cont present dosage.   Past Medical History:  Diagnosis Date  . Allergy   . Barrett's esophagus   . GERD (gastroesophageal reflux disease)   .  Hyperlipidemia   . Hypertension     Past Surgical History:  Procedure Laterality Date  . BREAST BIOPSY Right 09/04/2016   US guided right breast biopsy. Coil marker neg  . BREAST BIOPSY Right 09/07/2016   stereo biopsy. Ribbon marker neg  . ESOPHAGOGASTRODUODENOSCOPY (EGD) WITH PROPOFOL N/A 04/20/2016   Procedure: ESOPHAGOGASTRODUODENOSCOPY (EGD) WITH PROPOFOL;  Surgeon: Christena Deem, MD;  Location: [facility] ENDOSCOPY;  Service: Endoscopy;  Laterality: N/A;  . THROAT SURGERY     nodules taken off throat  . TUBAL LIGATION      Family History  Problem Relation Age of Onset  . Breast cancer Cousin        mat cousin    Social History   Socioeconomic History  . Marital status: Married    Spouse name: Not on file  . Number of children: Not on file  . Years of education: Not on file  . Highest education level: Not on file  Occupational History  . Not on file  Social Needs  . Financial resource strain: Not on file  . Food insecurity:    Worry: Not on file    Inability: Not on file  . Transportation needs:    Medical: Not on file    Non-medical: Not on file  Tobacco Use  . Smoking status: Never Smoker  . Smokeless tobacco: Never Used  Substance and Sexual Activity  . Alcohol use: No    Alcohol/week: 0.0 standard drinks  . Drug use: No  . Sexual activity: Not Currently  Lifestyle  . Physical activity:    Days per week: Not on file    Minutes per session: Not on file  .  Stress: Not on file  Relationships  . Social connections:    Talks on phone: Not on file    Gets together: Not on file    Attends religious service: Not on file    Active member of club or organization: Not on file    Attends meetings of clubs or organizations: Not on file    Relationship status: Not on file  . Intimate partner violence:    Fear of current or ex partner: Not on file    Emotionally abused: Not on file    Physically abused: Not on file    Forced sexual activity: Not on file  Other  Topics Concern  . Not on file  Social History Narrative  . Not on file    No Known Allergies  Outpatient Medications Prior to Visit  Medication Sig Dispense Refill  . albuterol (PROVENTIL HFA;VENTOLIN HFA) 108 (90 Base) MCG/ACT inhaler INHALE 2 PUFFS EVERY 6 HOURS AS NEEDED WHEEZING 8.5 Inhaler 1  . aspirin 81 MG tablet Take 1 tablet by mouth daily.    . celecoxib (CELEBREX) 200 MG capsule TAKE 1 CAPSULE BY MOUTH EVERY DAY 30 capsule 2  . clobetasol (TEMOVATE) 0.05 % external solution Apply 1 application topically 2 (two) times daily.     Marland Kitchen. griseofulvin (GRIFULVIN V) 500 MG tablet Take 500 mg by mouth 2 (two) times daily. Dr Cheree DittoGraham  5  . ketoconazole (NIZORAL) 2 % shampoo Apply 1 application topically as needed. Dr Cheree DittoGraham 120 mL 1  . pantoprazole (PROTONIX) 40 MG tablet Take 1 tablet (40 mg total) by mouth daily. Dr Marva PandaSkulskie (Patient taking differently: Take 40 mg by mouth 2 (two) times daily. ) 90 tablet 1  . simvastatin (ZOCOR) 20 MG tablet Take 1 tablet (20 mg total) by mouth daily. 90 tablet 1  . amLODipine (NORVASC) 5 MG tablet Take 1 tablet (5 mg total) by mouth daily. 90 tablet 1  . hydrochlorothiazide (HYDRODIURIL) 25 MG tablet Take 1 tablet (25 mg total) by mouth daily. 90 tablet 1  . losartan (COZAAR) 50 MG tablet Take 1 tablet (50 mg total) by mouth daily. 90 tablet 1   No facility-administered medications prior to visit.     Review of Systems  Constitutional: Negative for chills, diaphoresis, fever, malaise/fatigue and weight loss.  HENT: Positive for congestion, hoarse voice, sinus pressure, sneezing and sore throat. Negative for ear discharge and ear pain.   Eyes: Negative for blurred vision.  Respiratory: Negative for cough, sputum production, shortness of breath and wheezing.   Cardiovascular: Negative for chest pain, palpitations, orthopnea, leg swelling and PND.  Gastrointestinal: Negative for abdominal pain, blood in stool, constipation, diarrhea, heartburn, melena  and nausea.  Genitourinary: Negative for dysuria, frequency, hematuria and urgency.  Musculoskeletal: Negative for back pain, joint pain, myalgias and neck pain.  Skin: Negative for rash.  Neurological: Negative for dizziness, tingling, sensory change, focal weakness and headaches.  Endo/Heme/Allergies: Negative for environmental allergies and polydipsia. Does not bruise/bleed easily.  Psychiatric/Behavioral: Negative for depression and suicidal ideas. The patient is not nervous/anxious and does not have insomnia.      Objective  Vitals:   10/04/17 0816  BP: 130/84  Pulse: 80  Weight: 211 lb (95.7 kg)  Height: 5\' 4"  (1.626 m)    Physical Exam  Constitutional: No distress.  HENT:  Head: Normocephalic and atraumatic.  Right Ear: Hearing, tympanic membrane, external ear and ear canal normal.  Left Ear: Hearing, tympanic membrane, external ear and ear canal normal.  Nose: No mucosal edema. Right sinus exhibits maxillary sinus tenderness. Left sinus exhibits maxillary sinus tenderness.  Mouth/Throat: Uvula is midline, oropharynx is clear and moist and mucous membranes are normal. No oropharyngeal exudate, posterior oropharyngeal edema or posterior oropharyngeal erythema.  Eyes: Pupils are equal, round, and reactive to light. EOM are normal. Right eye exhibits no discharge. Left eye exhibits no discharge. Right conjunctiva is injected. Left conjunctiva is injected.  Neck: Normal range of motion. Neck supple. No JVD present. No thyromegaly present.  Cardiovascular: Normal rate, regular rhythm, normal heart sounds and intact distal pulses. Exam reveals no gallop and no friction rub.  No murmur heard. Pulmonary/Chest: Effort normal and breath sounds normal.  Abdominal: Soft. Bowel sounds are normal. She exhibits no mass. There is no tenderness. There is no guarding.  Musculoskeletal: Normal range of motion. She exhibits no edema.  Lymphadenopathy:    She has no cervical adenopathy.   Neurological: She is alert. She has normal reflexes.  Skin: Skin is warm and dry. She is not diaphoretic.      Assessment & Plan  Problem List Items Addressed This Visit      Cardiovascular and Mediastinum   Benign hypertension    Recent increase on HCTZ to 25mg  and losartan 50mg . Added Amlodipine at last visit. Rechecked B/P today- was within normal range. Cont present dosage.      Relevant Medications   amLODipine (NORVASC) 5 MG tablet   hydrochlorothiazide (HYDRODIURIL) 25 MG tablet   losartan (COZAAR) 50 MG tablet    Other Visit Diagnoses    Acute maxillary sinusitis, recurrence not specified    -  Primary   Acute Maxilllary tenderness with purulent drainage. prescribe amoxil 500 mg tid for 10 days.    Relevant Medications   amoxicillin (AMOXIL) 500 MG capsule   Influenza vaccine needed       Vaccine administered.   Relevant Orders   POCT Influenza A/B   Flu Vaccine QUAD 6+ mos PF IM (Fluarix Quad PF) (Completed)      Meds ordered this encounter  Medications  . amoxicillin (AMOXIL) 500 MG capsule    Sig: Take 1 capsule (500 mg total) by mouth 3 (three) times daily.    Dispense:  30 capsule    Refill:  0  . amLODipine (NORVASC) 5 MG tablet    Sig: Take 1 tablet (5 mg total) by mouth daily.    Dispense:  90 tablet    Refill:  1  . hydrochlorothiazide (HYDRODIURIL) 25 MG tablet    Sig: Take 1 tablet (25 mg total) by mouth daily.    Dispense:  90 tablet    Refill:  1  . losartan (COZAAR) 50 MG tablet    Sig: Take 1 tablet (50 mg total) by mouth daily.    Dispense:  90 tablet    Refill:  1      Dr. Elizabeth Sauer [facility]  10/04/17

## 2017-10-04 NOTE — Assessment & Plan Note (Addendum)
Recent increase on HCTZ to 25mg  and losartan 50mg . Added Amlodipine at last visit. Rechecked B/P today- was within normal range. Cont present dosage.

## 2017-10-09 ENCOUNTER — Other Ambulatory Visit: Payer: Self-pay

## 2017-10-09 ENCOUNTER — Ambulatory Visit
Admission: EM | Admit: 2017-10-09 | Discharge: 2017-10-09 | Disposition: A | Discharge status: Home / Self Care | Payer: 59 | Attending: Family Medicine | Admitting: Family Medicine

## 2017-10-09 DIAGNOSIS — H1131 Conjunctival hemorrhage, right eye: Secondary | ICD-10-CM | POA: Diagnosis not present

## 2017-10-09 DIAGNOSIS — H109 Unspecified conjunctivitis: Secondary | ICD-10-CM | POA: Diagnosis not present

## 2017-10-09 MED ORDER — MOXIFLOXACIN HCL 0.5 % OP SOLN: 1.0000 [drp] | Freq: Three times a day (TID) | OPHTHALMIC | 0 refills | Status: DC

## 2017-10-09 NOTE — ED Provider Notes (Signed)
[facility]    CSN: [id number] Arrival date & time: 10/09/17  1241     History   Chief Complaint Chief Complaint  Patient presents with  . Eye Problem    HPI Susan Gregory is a 57 y.o. female.   57 yo female with a c/o 5 days of right eye discomfort, redness and tearing. Denies any injuries, fevers, chills, drainage.   The history is provided by the patient.  Eye Problem    Past Medical History:  Diagnosis Date  . Allergy   . Barrett's esophagus   . GERD (gastroesophageal reflux disease)   . Hyperlipidemia   . Hypertension     Patient Active Problem List   Diagnosis Date Noted  . Familial multiple lipoprotein-type hyperlipidemia 07/18/2014  . Disorder of lipoprotein and lipid metabolism 07/18/2014  . Benign hypertension 07/18/2014  . Acid reflux 07/18/2014  . Encounter for general adult medical examination without abnormal findings 07/18/2014  . Neck sprain and strain 07/18/2014  . Abnormal breast finding 07/18/2014  . Abnormal finding on breast imaging 07/18/2014  . Arthritis of knee, degenerative 07/18/2014  . TB skin/subcutaneous 07/18/2014  . Fecal occult blood test positive 11/01/2013    Past Surgical History:  Procedure Laterality Date  . BREAST BIOPSY Right 09/04/2016   US guided right breast biopsy. Coil marker neg  . BREAST BIOPSY Right 09/07/2016   stereo biopsy. Ribbon marker neg  . ESOPHAGOGASTRODUODENOSCOPY (EGD) WITH PROPOFOL N/A 04/20/2016   Procedure: ESOPHAGOGASTRODUODENOSCOPY (EGD) WITH PROPOFOL;  Surgeon: Christena DeemMartin U Skulskie, MD;  Location: [facility] END[facility]COPY;  Service: Endoscopy;  Laterality: N/A;  . THROAT SURGERY     nodules taken off throat  . TUBAL LIGATION      OB History   None      Home Medications    Prior to Admission medications   Medication Sig Start Date End Date Taking? Authorizing Provider  albuterol (PROVENTIL HFA;VENTOLIN HFA) 108 (90 Base) MCG/ACT inhaler INHALE 2 PUFFS EVERY 6 HOURS AS NEEDED WHEEZING  07/16/17   Duanne LimerickJones, Deanna C, MD  amLODipine (NORVASC) 5 MG tablet Take 1 tablet (5 mg total) by mouth daily. 10/04/17   Duanne LimerickJones, Deanna C, MD  amoxicillin (AMOXIL) 500 MG capsule Take 1 capsule (500 mg total) by mouth 3 (three) times daily. 10/04/17   Duanne LimerickJones, Deanna C, MD  aspirin 81 MG tablet Take 1 tablet by mouth daily.    [provider]  celecoxib (CELEBREX) 200 MG capsule TAKE 1 CAPSULE BY MOUTH EVERY DAY 09/06/17   Duanne LimerickJones, Deanna C, MD  clobetasol (TEMOVATE) 0.05 % external solution Apply 1 application topically 2 (two) times daily.  01/23/13   [provider]  griseofulvin (GRIFULVIN V) 500 MG tablet Take 500 mg by mouth 2 (two) times daily. Dr Cheree DittoGraham    [provider]  hydrochlorothiazide (HYDRODIURIL) 25 MG tablet Take 1 tablet (25 mg total) by mouth daily. 10/04/17   Duanne LimerickJones, Deanna C, MD  ketoconazole (NIZORAL) 2 % shampoo Apply 1 application topically as needed. Dr Cheree DittoGraham 05/22/15   Duanne LimerickJones, Deanna C, MD  losartan (COZAAR) 50 MG tablet Take 1 tablet (50 mg total) by mouth daily. 10/04/17   Duanne LimerickJones, Deanna C, MD  moxifloxacin (VIGAMOX) 0.5 % ophthalmic solution Place 1 drop into the right eye 3 (three) times daily. 10/09/17   Payton Mccallumonty, Arloa Prak, MD  pantoprazole (PROTONIX) 40 MG tablet Take 1 tablet (40 mg total) by mouth daily. Dr Marva PandaSkulskie Patient taking differently: Take 40 mg by mouth 2 (two) times  daily.  05/03/17   Duanne Limerick, MD  simvastatin (ZOCOR) 20 MG tablet Take 1 tablet (20 mg total) by mouth daily. 08/23/17   Duanne Limerick, MD    Family History Family History  Problem Relation Age of Onset  . Breast cancer Cousin        mat cousin    Social History Social History   Tobacco Use  . Smoking status: Never Smoker  . Smokeless tobacco: Never Used  Substance Use Topics  . Alcohol use: No    Alcohol/week: 0.0 standard drinks  . Drug use: No     Allergies   Patient has no known allergies.   Review of Systems Review of Systems   Physical  Exam Triage Vital Signs ED Triage Vitals [10/09/17 1252]  Enc Vitals Group     BP (!) 141/89     Pulse Rate 74     Resp 18     Temp 98.9 F (37.2 C)     Temp src      SpO2 99 %     Weight 200 lb (90.7 kg)     Height 5\' 4"  (1.626 m)     Head Circumference      Peak Flow      Pain Score 0     Pain Loc      Pain Edu?      Excl. in GC?    No data found.  Updated Vital Signs BP (!) 141/89 (BP Location: Left Arm)   Pulse 74   Temp 98.9 F (37.2 C)   Resp 18   Ht 5\' 4"  (1.626 m)   Wt 90.7 kg   LMP  (LMP Unknown) Comment: menopausal, denies preg  SpO2 99%   BMI 34.33 kg/m   Visual Acuity Right Eye Distance:   Left Eye Distance:   Bilateral Distance:    Right Eye Near:   Left Eye Near:    Bilateral Near:     Physical Exam  Constitutional: She appears well-developed and well-nourished. No distress.  Eyes: Pupils are equal, round, and reactive to light. EOM and lids are normal. Lids are everted and swept, no foreign bodies found. Right eye exhibits no discharge. Left eye exhibits no discharge. Right conjunctiva is injected. Right conjunctiva has a hemorrhage.  Skin: She is not diaphoretic.  Nursing note and vitals reviewed.    UC Treatments / Results  Labs (all labs ordered are listed, but only abnormal results are displayed) Labs Reviewed - No data to display  EKG None  Radiology No results found.  Procedures Procedures (including critical care time)  Medications Ordered in UC Medications - No data to display  Initial Impression / Assessment and Plan / UC Course  I have reviewed the triage vital signs and the nursing notes.  Pertinent labs & imaging results that were available during my care of the patient were reviewed by me and considered in my medical decision making (see chart for details).      Final Clinical Impressions(s) / UC Diagnoses   Final diagnoses:  Subconjunctival hemorrhage of right eye  Conjunctivitis of right eye, unspecified  conjunctivitis type   Discharge Instructions   None    ED Prescriptions    Medication Sig Dispense Auth. Provider   moxifloxacin (VIGAMOX) 0.5 % ophthalmic solution Place 1 drop into the right eye 3 (three) times daily. 3 mL Payton Mccallum, MD      1. diagnosis reviewed with patient 2. rx as per orders  above; reviewed possible side effects, interactions, risks and benefits  3. Recommend supportive treatment with cool compresses 4. Follow-up prn if symptoms worsen or don't improve  Controlled Substance Prescriptions [place] Controlled Substance Registry consulted? Not Applicable   Payton Mccallum, MD 10/09/17 [phone]

## 2017-10-09 NOTE — ED Triage Notes (Addendum)
Pt reports she is being treated for a sinus infection. Eye was somewhat reddened on Monday and Dr. Yetta BarreJones told her to use over the counter drops. Today woke up with mild itchiness to right eye worse than before.  VISUAL ACUITY: Uncorrected right eye 20/30 -1 Uncorrected left eye 20/70 Uncorrected both eyes 20/30 -2

## 2017-11-28 ENCOUNTER — Other Ambulatory Visit: Payer: Self-pay | Admitting: Family Medicine

## 2017-11-28 DIAGNOSIS — M79662 Pain in left lower leg: Secondary | ICD-10-CM

## 2017-11-28 DIAGNOSIS — M1712 Unilateral primary osteoarthritis, left knee: Secondary | ICD-10-CM

## 2017-12-27 ENCOUNTER — Other Ambulatory Visit: Payer: Self-pay | Admitting: Family Medicine

## 2017-12-27 DIAGNOSIS — I10 Essential (primary) hypertension: Secondary | ICD-10-CM

## 2018-01-21 ENCOUNTER — Other Ambulatory Visit: Payer: Self-pay | Admitting: Family Medicine

## 2018-01-21 DIAGNOSIS — M79662 Pain in left lower leg: Secondary | ICD-10-CM

## 2018-01-21 DIAGNOSIS — M1712 Unilateral primary osteoarthritis, left knee: Secondary | ICD-10-CM

## 2018-02-11 ENCOUNTER — Ambulatory Visit: Payer: 59

## 2018-02-11 ENCOUNTER — Ambulatory Visit
Admission: RE | Admit: 2018-02-11 | Discharge: 2018-02-11 | Disposition: A | Discharge status: Home / Self Care | Payer: 59 | Attending: Family Medicine | Admitting: Family Medicine

## 2018-02-11 ENCOUNTER — Ambulatory Visit (INDEPENDENT_AMBULATORY_CARE_PROVIDER_SITE_OTHER): Payer: 59 | Admitting: Family Medicine

## 2018-02-11 ENCOUNTER — Ambulatory Visit
Admission: RE | Admit: 2018-02-11 | Discharge: 2018-02-11 | Disposition: A | Discharge status: Home / Self Care | Payer: 59 | Source: Ambulatory Visit | Attending: Family Medicine | Admitting: Family Medicine

## 2018-02-11 ENCOUNTER — Encounter: Payer: Self-pay | Admitting: Family Medicine

## 2018-02-11 VITALS — BP 120/80 | HR 98 | Ht 64.0 in | Wt 209.0 lb

## 2018-02-11 DIAGNOSIS — E669 Obesity, unspecified: Secondary | ICD-10-CM | POA: Diagnosis not present

## 2018-02-11 DIAGNOSIS — J189 Pneumonia, unspecified organism: Secondary | ICD-10-CM

## 2018-02-11 DIAGNOSIS — J181 Lobar pneumonia, unspecified organism: Secondary | ICD-10-CM | POA: Diagnosis not present

## 2018-02-11 MED ORDER — AMOXICILLIN-POT CLAVULANATE 875-125 MG PO TABS: 1.0000 | ORAL_TABLET | Freq: Two times a day (BID) | ORAL | 0 refills | Status: DC

## 2018-02-11 MED ORDER — GUAIFENESIN-CODEINE 100-10 MG/5ML PO SYRP: 5.0000 mL | ORAL_SOLUTION | Freq: Three times a day (TID) | ORAL | 0 refills | Status: DC | PRN

## 2018-02-11 MED ORDER — BENZONATATE 100 MG PO CAPS: 100.0000 mg | ORAL_CAPSULE | Freq: Two times a day (BID) | ORAL | 0 refills | Status: DC | PRN

## 2018-02-11 NOTE — Progress Notes (Signed)
Date:  02/11/2018   Name:  Susan Gregory   DOB:  1960-06-20   MRN:  [id number]   Chief Complaint: Sinusitis (sinus cong, coughing- light green production .L) side of back is sore after having a "hard coughing spell")  Sinusitis  This is a new problem. The current episode started in the past 7 days. The problem has been gradually worsening since onset. Maximum temperature: low grade. Associated symptoms include chills, congestion, coughing, a hoarse voice, shortness of breath, sneezing and a sore throat. Pertinent negatives include no diaphoresis, ear pain, headaches, neck pain, sinus pressure or swollen glands. Past treatments include acetaminophen and oral decongestants. The treatment provided mild relief.    Review of Systems  Constitutional: Positive for chills. Negative for diaphoresis, fatigue, fever and unexpected weight change.  HENT: Positive for congestion, hoarse voice, sneezing and sore throat. Negative for ear discharge, ear pain, rhinorrhea and sinus pressure.   Eyes: Negative for photophobia, pain, discharge, redness and itching.  Respiratory: Positive for cough and shortness of breath. Negative for wheezing and stridor.   Gastrointestinal: Negative for abdominal pain, blood in stool, constipation, diarrhea, nausea and vomiting.  Endocrine: Negative for cold intolerance, heat intolerance, polydipsia, polyphagia and polyuria.  Genitourinary: Negative for dysuria, flank pain, frequency, hematuria, menstrual problem, pelvic pain, urgency, vaginal bleeding and vaginal discharge.  Musculoskeletal: Negative for arthralgias, back pain, myalgias and neck pain.  Skin: Negative for rash.  Allergic/Immunologic: Negative for environmental allergies and food allergies.  Neurological: Negative for dizziness, weakness, light-headedness, numbness and headaches.  Hematological: Negative for adenopathy. Does not bruise/bleed easily.  Psychiatric/Behavioral: Negative for dysphoric mood. The  patient is not nervous/anxious.     Patient Active Problem List   Diagnosis Date Noted  . Familial multiple lipoprotein-type hyperlipidemia 07/18/2014  . Disorder of lipoprotein and lipid metabolism 07/18/2014  . Benign hypertension 07/18/2014  . Acid reflux 07/18/2014  . Encounter for general adult medical examination without abnormal findings 07/18/2014  . Neck sprain and strain 07/18/2014  . Abnormal breast finding 07/18/2014  . Abnormal finding on breast imaging 07/18/2014  . Arthritis of knee, degenerative 07/18/2014  . TB skin/subcutaneous 07/18/2014  . Fecal occult blood test positive 11/01/2013    No Known Allergies  Past Surgical History:  Procedure Laterality Date  . BREAST BIOPSY Right 09/04/2016   US guided right breast biopsy. Coil marker neg  . BREAST BIOPSY Right 09/07/2016   stereo biopsy. Ribbon marker neg  . ESOPHAGOGASTRODUODENOSCOPY (EGD) WITH PROPOFOL N/A 04/20/2016   Procedure: ESOPHAGOGASTRODUODENOSCOPY (EGD) WITH PROPOFOL;  Surgeon: Christena DeemMartin U Skulskie, MD;  [facility];  Service: Endoscopy;  Laterality: N/A;  . THROAT SURGERY     nodules taken off throat  . TUBAL LIGATION      Social History   Tobacco Use  . Smoking status: Never Smoker  . Smokeless tobacco: Never Used  Substance Use Topics  . Alcohol use: No    Alcohol/week: 0.0 standard drinks  . Drug use: No     Medication list has been reviewed and updated.  Current Meds  Medication Sig  . albuterol (PROVENTIL HFA;VENTOLIN HFA) 108 (90 Base) MCG/ACT inhaler INHALE 2 PUFFS EVERY 6 HOURS AS NEEDED WHEEZING  . amLODipine (NORVASC) 5 MG tablet Take 1 tablet (5 mg total) by mouth daily.  Marland Kitchen. aspirin 81 MG tablet Take 1 tablet by mouth daily.  . celecoxib (CELEBREX) 200 MG capsule TAKE 1 CAPSULE BY MOUTH EVERY DAY  . clobetasol (TEMOVATE) 0.05 % external solution Apply  1 application topically 2 (two) times daily.   Marland Kitchen. griseofulvin (GRIFULVIN V) 500 MG tablet Take 500 mg by mouth 2  (two) times daily. Dr Cheree DittoGraham  . hydrochlorothiazide (HYDRODIURIL) 25 MG tablet Take 1 tablet (25 mg total) by mouth daily.  Marland Kitchen. ketoconazole (NIZORAL) 2 % shampoo Apply 1 application topically as needed. Dr Cheree DittoGraham  . losartan (COZAAR) 50 MG tablet Take 1 tablet (50 mg total) by mouth daily.  . pantoprazole (PROTONIX) 40 MG tablet Take 1 tablet (40 mg total) by mouth daily. Dr Marva PandaSkulskie (Patient taking differently: Take 40 mg by mouth 2 (two) times daily. )  . simvastatin (ZOCOR) 20 MG tablet Take 1 tablet (20 mg total) by mouth daily.  . [DISCONTINUED] moxifloxacin (VIGAMOX) 0.5 % ophthalmic solution Place 1 drop into the right eye 3 (three) times daily.    PHQ 2/9 Scores 12/28/2016 07/19/2014  PHQ - 2 Score 0 0  PHQ- 9 Score 0 -    Physical Exam Vitals signs and nursing note reviewed.  Constitutional:      General: She is not in acute distress.    Appearance: She is not diaphoretic.  HENT:     Head: Normocephalic and atraumatic.     Right Ear: Tympanic membrane, ear canal and external ear normal.     Left Ear: Tympanic membrane, ear canal and external ear normal.     Nose: Nose normal.  Eyes:     General:        Right eye: No discharge.        Left eye: No discharge.     Conjunctiva/sclera: Conjunctivae normal.     Pupils: Pupils are equal, round, and reactive to light.  Neck:     Musculoskeletal: Normal range of motion and neck supple.     Thyroid: No thyromegaly.     Vascular: No JVD.  Cardiovascular:     Rate and Rhythm: Normal rate and regular rhythm.     Heart sounds: Normal heart sounds. No murmur. No friction rub. No gallop.   Pulmonary:     Effort: Pulmonary effort is normal.     Breath sounds: Examination of the right-lower field reveals decreased breath sounds. Examination of the left-lower field reveals decreased breath sounds. Decreased breath sounds present. No wheezing, rhonchi or rales.  Chest:     Chest wall: No tenderness.  Abdominal:     General: Bowel sounds  are normal.     Palpations: Abdomen is soft. There is no mass.     Tenderness: There is no abdominal tenderness. There is no guarding.  Musculoskeletal: Normal range of motion.  Lymphadenopathy:     Cervical: No cervical adenopathy.  Skin:    General: Skin is warm and dry.  Neurological:     Mental Status: She is alert.     Deep Tendon Reflexes: Reflexes are normal and symmetric.     BP 120/80   Pulse 98   Ht 5\' 4"  (1.626 m)   Wt 209 lb (94.8 kg)   LMP  (LMP Unknown) Comment: menopausal, denies preg  BMI 35.87 kg/m   Assessment and Plan:  1. Obesity, Class II, BMI 35-39.9 Health risks of being over weight were discussed and patient was counseled on weight loss options and exercise.  2. Community acquired pneumonia of left lower lobe of lung (HCC) New onset he has history of pneumonia in the left lower lobe in 2019 repeat chest x-ray given that she has decreased breath sounds in the general area again.  Was placed on Augmentin 875 twice a day follow-up cough symptoms with Robitussin-AC and Tessalon Perles as needed. - DG Chest 2 View; Future - amoxicillin-clavulanate (AUGMENTIN) 875-125 MG tablet; Take 1 tablet by mouth 2 (two) times daily.  Dispense: 20 tablet; Refill: 0 - guaiFENesin-codeine (ROBITUSSIN AC) 100-10 MG/5ML syrup; Take 5 mLs by mouth 3 (three) times daily as needed for cough.  Dispense: 150 mL; Refill: 0 - benzonatate (TESSALON) 100 MG capsule; Take 1 capsule (100 mg total) by mouth 2 (two) times daily as needed for cough.  Dispense: 20 capsule; Refill: 0

## 2018-02-22 ENCOUNTER — Other Ambulatory Visit: Payer: Self-pay | Admitting: Family Medicine

## 2018-02-22 DIAGNOSIS — I10 Essential (primary) hypertension: Secondary | ICD-10-CM

## 2018-03-19 ENCOUNTER — Other Ambulatory Visit: Payer: Self-pay | Admitting: Family Medicine

## 2018-03-19 DIAGNOSIS — I10 Essential (primary) hypertension: Secondary | ICD-10-CM

## 2018-03-31 ENCOUNTER — Encounter: Payer: Self-pay | Admitting: Family Medicine

## 2018-03-31 ENCOUNTER — Ambulatory Visit (INDEPENDENT_AMBULATORY_CARE_PROVIDER_SITE_OTHER): Payer: 59 | Admitting: Family Medicine

## 2018-03-31 ENCOUNTER — Other Ambulatory Visit: Payer: Self-pay

## 2018-03-31 VITALS — BP 123/81 | HR 79 | Resp 16 | Ht 64.0 in | Wt 207.0 lb

## 2018-03-31 DIAGNOSIS — K219 Gastro-esophageal reflux disease without esophagitis: Secondary | ICD-10-CM

## 2018-03-31 DIAGNOSIS — I10 Essential (primary) hypertension: Secondary | ICD-10-CM

## 2018-03-31 DIAGNOSIS — L409 Psoriasis, unspecified: Secondary | ICD-10-CM

## 2018-03-31 DIAGNOSIS — E7849 Other hyperlipidemia: Secondary | ICD-10-CM

## 2018-03-31 MED ORDER — PANTOPRAZOLE SODIUM 40 MG PO TBEC: 80.0000 mg | DELAYED_RELEASE_TABLET | Freq: Every day | ORAL | 1 refills | Status: DC

## 2018-03-31 MED ORDER — AMLODIPINE BESYLATE 5 MG PO TABS: 5.0000 mg | ORAL_TABLET | Freq: Every day | ORAL | 1 refills | Status: DC

## 2018-03-31 MED ORDER — SIMVASTATIN 20 MG PO TABS: 20.0000 mg | ORAL_TABLET | Freq: Every day | ORAL | 1 refills | Status: DC

## 2018-03-31 MED ORDER — HYDROCHLOROTHIAZIDE 25 MG PO TABS: 25.0000 mg | ORAL_TABLET | Freq: Every day | ORAL | 1 refills | Status: DC

## 2018-03-31 MED ORDER — LOSARTAN POTASSIUM 25 MG PO TABS: 50.0000 mg | ORAL_TABLET | Freq: Every day | ORAL | 1 refills | Status: DC

## 2018-03-31 MED ORDER — CLOBETASOL PROPIONATE 0.05 % EX SOLN: 1.0000 "application " | Freq: Two times a day (BID) | CUTANEOUS | 3 refills | Status: DC

## 2018-03-31 NOTE — Progress Notes (Signed)
Date:  03/31/2018   Name:  Susan Gregory   DOB:  Jan 04, 1961   MRN:  [id number]   Chief Complaint: Hypertension and Hyperlipidemia  Hypertension  This is a chronic problem. The current episode started more than 1 year ago. The problem is unchanged. The problem is controlled. Pertinent negatives include no anxiety, blurred vision, chest pain, headaches, malaise/fatigue, neck pain, orthopnea, palpitations, peripheral edema, PND, shortness of breath or sweats. There are no associated agents to hypertension. Risk factors for coronary artery disease include dyslipidemia and obesity. Past treatments include calcium channel blockers, diuretics and angiotensin blockers. The current treatment provides moderate improvement. Compliance problems include medication side effects.  There is no history of angina, kidney disease, CAD/MI, CVA, heart failure, left ventricular hypertrophy, PVD or retinopathy. There is no history of chronic renal disease, a hypertension causing med or renovascular disease.  Hyperlipidemia  This is a chronic problem. The current episode started more than 1 year ago. The problem is controlled. Recent lipid tests were reviewed and are normal. She has no history of chronic renal disease, diabetes, hypothyroidism, liver disease, obesity or nephrotic syndrome. There are no known factors aggravating her hyperlipidemia. Pertinent negatives include no chest pain, focal sensory loss, focal weakness, leg pain, myalgias or shortness of breath. Current antihyperlipidemic treatment includes statins. The current treatment provides moderate improvement of lipids. There are no compliance problems.  Risk factors for coronary artery disease include hypertension, dyslipidemia, obesity and post-menopausal.  Gastroesophageal Reflux  She complains of heartburn. She reports no abdominal pain, no belching, no chest pain, no choking, no coughing, no dysphagia, no early satiety, no hoarse voice, no nausea, no sore  throat or no wheezing. This is a chronic problem. The current episode started more than 1 year ago. The problem occurs rarely. The problem has been unchanged. The symptoms are aggravated by certain foods. Pertinent negatives include no anemia, fatigue, melena, muscle weakness, orthopnea or weight loss. Risk factors include obesity. She has tried a PPI for the symptoms. The treatment provided moderate relief.    Review of Systems  Constitutional: Negative.  Negative for chills, fatigue, fever, malaise/fatigue, unexpected weight change and weight loss.  HENT: Negative for congestion, ear discharge, ear pain, hoarse voice, rhinorrhea, sinus pressure, sneezing and sore throat.   Eyes: Negative for blurred vision, photophobia, pain, discharge, redness and itching.  Respiratory: Negative for cough, choking, shortness of breath, wheezing and stridor.   Cardiovascular: Negative for chest pain, palpitations, orthopnea and PND.  Gastrointestinal: Positive for heartburn. Negative for abdominal pain, blood in stool, constipation, diarrhea, dysphagia, melena, nausea and vomiting.  Endocrine: Negative for cold intolerance, heat intolerance, polydipsia, polyphagia and polyuria.  Genitourinary: Negative for dysuria, flank pain, frequency, hematuria, menstrual problem, pelvic pain, urgency, vaginal bleeding and vaginal discharge.  Musculoskeletal: Negative for arthralgias, back pain, myalgias, muscle weakness and neck pain.  Skin: Negative for rash.  Allergic/Immunologic: Negative for environmental allergies and food allergies.  Neurological: Negative for dizziness, focal weakness, weakness, light-headedness, numbness and headaches.  Hematological: Negative for adenopathy. Does not bruise/bleed easily.  Psychiatric/Behavioral: Negative for dysphoric mood. The patient is not nervous/anxious.     Patient Active Problem List   Diagnosis Date Noted  . Familial multiple lipoprotein-type hyperlipidemia 07/18/2014  .  Disorder of lipoprotein and lipid metabolism 07/18/2014  . Benign hypertension 07/18/2014  . Acid reflux 07/18/2014  . Encounter for general adult medical examination without abnormal findings 07/18/2014  . Neck sprain and strain 07/18/2014  . Abnormal breast finding  07/18/2014  . Abnormal finding on breast imaging 07/18/2014  . Arthritis of knee, degenerative 07/18/2014  . TB skin/subcutaneous 07/18/2014  . Fecal occult blood test positive 11/01/2013    No Known Allergies  Past Surgical History:  Procedure Laterality Date  . BREAST BIOPSY Right 09/04/2016   US guided right breast biopsy. Coil marker neg  . BREAST BIOPSY Right 09/07/2016   stereo biopsy. Ribbon marker neg  . ESOPHAGOGASTRODUODENOSCOPY (EGD) WITH PROPOFOL N/A 04/20/2016   Procedure: ESOPHAGOGASTRODUODENOSCOPY (EGD) WITH PROPOFOL;  Surgeon: Christena Deem, MD;  Location: [facility] ENDOSCOPY;  Service: Endoscopy;  Laterality: N/A;  . THROAT SURGERY     nodules taken off throat  . TUBAL LIGATION      Social History   Tobacco Use  . Smoking status: Never Smoker  . Smokeless tobacco: Never Used  Substance Use Topics  . Alcohol use: No    Alcohol/week: 0.0 standard drinks  . Drug use: No     Medication list has been reviewed and updated.  Current Meds  Medication Sig  . albuterol (PROVENTIL HFA;VENTOLIN HFA) 108 (90 Base) MCG/ACT inhaler INHALE 2 PUFFS EVERY 6 HOURS AS NEEDED WHEEZING  . amLODipine (NORVASC) 5 MG tablet Take 1 tablet (5 mg total) by mouth daily.  Marland Kitchen aspirin 81 MG tablet Take 1 tablet by mouth daily.  . celecoxib (CELEBREX) 200 MG capsule TAKE 1 CAPSULE BY MOUTH EVERY DAY  . clobetasol (TEMOVATE) 0.05 % external solution Apply 1 application topically 2 (two) times daily.   Marland Kitchen griseofulvin (GRIFULVIN V) 500 MG tablet Take 500 mg by mouth 2 (two) times daily. Dr Cheree Ditto  . hydrochlorothiazide (HYDRODIURIL) 25 MG tablet Take 1 tablet (25 mg total) by mouth daily.  Marland Kitchen ketoconazole (NIZORAL) 2 % shampoo  Apply 1 application topically as needed. Dr Cheree Ditto  . losartan (COZAAR) 25 MG tablet TAKE 2 TABLETS BY MOUTH EVERY DAY  . pantoprazole (PROTONIX) 40 MG tablet Take 1 tablet (40 mg total) by mouth daily. Dr Marva Panda (Patient taking differently: Take 40 mg by mouth 2 (two) times daily. )  . simvastatin (ZOCOR) 20 MG tablet Take 1 tablet (20 mg total) by mouth daily.    PHQ 2/9 Scores 12/28/2016 07/19/2014  PHQ - 2 Score 0 0  PHQ- 9 Score 0 -    Physical Exam Vitals signs and nursing note reviewed.  Constitutional:      General: She is not in acute distress.    Appearance: She is not diaphoretic.  HENT:     Head: Normocephalic and atraumatic.     Right Ear: External ear normal.     Left Ear: External ear normal.     Nose: Nose normal.  Eyes:     General:        Right eye: No discharge.        Left eye: No discharge.     Conjunctiva/sclera: Conjunctivae normal.     Pupils: Pupils are equal, round, and reactive to light.  Neck:     Musculoskeletal: Normal range of motion and neck supple.     Thyroid: No thyromegaly.     Vascular: No JVD.  Cardiovascular:     Rate and Rhythm: Normal rate and regular rhythm.     Heart sounds: Normal heart sounds. No murmur. No friction rub. No gallop.   Pulmonary:     Effort: Pulmonary effort is normal.     Breath sounds: Normal breath sounds.  Abdominal:     General: Bowel sounds are normal.  Palpations: Abdomen is soft. There is no mass.     Tenderness: There is no abdominal tenderness. There is no guarding.  Musculoskeletal: Normal range of motion.  Lymphadenopathy:     Cervical: No cervical adenopathy.  Skin:    General: Skin is warm and dry.  Neurological:     Mental Status: She is alert.     Deep Tendon Reflexes: Reflexes are normal and symmetric.     BP 123/81   Pulse 79   Resp 16   Ht 5\' 4"  (1.626 m)   Wt 207 lb (93.9 kg)   LMP  (LMP Unknown) Comment: menopausal, denies preg  SpO2 99%   BMI 35.53 kg/m   Assessment and  Plan: 1. Benign hypertension Chronic.  Controlled.  We will continue amlodipine 5 mg and and chlorothiazide 25 mg and losartan 25 mg 2 tablets daily.  Review of past labs. - amLODipine (NORVASC) 5 MG tablet; Take 1 tablet (5 mg total) by mouth daily.  Dispense: 90 tablet; Refill: 1 - hydrochlorothiazide (HYDRODIURIL) 25 MG tablet; Take 1 tablet (25 mg total) by mouth daily.  Dispense: 90 tablet; Refill: 1 - losartan (COZAAR) 25 MG tablet; Take 2 tablets (50 mg total) by mouth daily.  Dispense: 189 tablet; Refill: 1  2. Familial multiple lipoprotein-type hyperlipidemia Chronic.  Controlled.  Will continue with Zocor 20 mg once a day review of past lipid panel was within acceptable limits. - simvastatin (ZOCOR) 20 MG tablet; Take 1 tablet (20 mg total) by mouth daily.  Dispense: 90 tablet; Refill: 1  3. Gastroesophageal reflux disease, esophagitis presence not specified Patient has history of GERD which is controlled by pantoprazole 40 mg 2 tablets.  This is followed with Dr. Marva PandaSkulskie of gastroenterology. - pantoprazole (PROTONIX) 40 MG tablet; Take 2 tablets (80 mg total) by mouth daily. Dr Marva PandaSkulskie  Dispense: 180 tablet; Refill: 1  4. Psoriasis of scalp Patient has what sounds like psoriasis with BX.  History of tinea capitis.  She is not needing griseofulvin at this time but would like a refill of her Temovate solution. - clobetasol (TEMOVATE) 0.05 % external solution; Apply 1 application topically 2 (two) times daily.  Dispense: 50 mL; Refill: 3

## 2018-03-31 NOTE — Patient Instructions (Signed)
Mediterranean Diet  A Mediterranean diet refers to food and lifestyle choices that are based on the traditions of countries located on the [place]. This way of eating has been shown to help prevent certain conditions and improve outcomes for people who have chronic diseases, like kidney disease and heart disease.  What are tips for following this plan?  Lifestyle   Cook and eat meals together with your family, when possible.   Drink enough fluid to keep your urine clear or pale yellow.   Be physically active every day. This includes:  ? Aerobic exercise like running or swimming.  ? Leisure activities like gardening, walking, or housework.   Get 7-8 hours of sleep each night.   If recommended by your health care provider, drink red wine in moderation. This means 1 glass a day for nonpregnant women and 2 glasses a day for men. A glass of wine equals 5 oz (150 mL).  Reading food labels     Check the serving size of packaged foods. For foods such as rice and pasta, the serving size refers to the amount of cooked product, not dry.   Check the total fat in packaged foods. Avoid foods that have saturated fat or trans fats.   Check the ingredients list for added sugars, such as corn syrup.  Shopping   At the grocery store, buy most of your food from the areas near the walls of the store. This includes:  ? Fresh fruits and vegetables (produce).  ? Grains, beans, nuts, and seeds. Some of these may be available in unpackaged forms or large amounts (in bulk).  ? Fresh seafood.  ? Poultry and eggs.  ? Low-fat dairy products.   Buy whole ingredients instead of prepackaged foods.   Buy fresh fruits and vegetables in-season from local farmers markets.   Buy frozen fruits and vegetables in resealable bags.   If you do not have access to quality fresh seafood, buy precooked frozen shrimp or canned fish, such as tuna, salmon, or sardines.   Buy small amounts of raw or cooked vegetables, salads, or olives from  the deli or salad bar at your store.   Stock your pantry so you always have certain foods on hand, such as olive oil, canned tuna, canned tomatoes, rice, pasta, and beans.  Cooking   Cook foods with extra-virgin olive oil instead of using butter or other vegetable oils.   Have meat as a side dish, and have vegetables or grains as your main dish. This means having meat in small portions or adding small amounts of meat to foods like pasta or stew.   Use beans or vegetables instead of meat in common dishes like chili or lasagna.   Experiment with different cooking methods. Try roasting or broiling vegetables instead of steaming or sauteing them.   Add frozen vegetables to soups, stews, pasta, or rice.   Add nuts or seeds for added healthy fat at each meal. You can add these to yogurt, salads, or vegetable dishes.   Marinate fish or vegetables using olive oil, lemon juice, garlic, and fresh herbs.  Meal planning     Plan to eat 1 vegetarian meal one day each week. Try to work up to 2 vegetarian meals, if possible.   Eat seafood 2 or more times a week.   Have healthy snacks readily available, such as:  ? Vegetable sticks with hummus.  ? Greek yogurt.  ? Fruit and nut trail mix.   Eat balanced   meals throughout the week. This includes:  ? Fruit: 2-3 servings a day  ? Vegetables: 4-5 servings a day  ? Low-fat dairy: 2 servings a day  ? Fish, poultry, or lean meat: 1 serving a day  ? Beans and legumes: 2 or more servings a week  ? Nuts and seeds: 1-2 servings a day  ? Whole grains: 6-8 servings a day  ? Extra-virgin olive oil: 3-4 servings a day   Limit red meat and sweets to only a few servings a month  What are my food choices?   Mediterranean diet  ? Recommended  ? Grains: Whole-grain pasta. Brown rice. Bulgar wheat. Polenta. Couscous. Whole-wheat bread. Oatmeal. Quinoa.  ? Vegetables: Artichokes. Beets. Broccoli. Cabbage. Carrots. Eggplant. Green beans. Chard. Kale. Spinach. Onions. Leeks. Peas. Squash.  Tomatoes. Peppers. Radishes.  ? Fruits: Apples. Apricots. Avocado. Berries. Bananas. Cherries. Dates. Figs. Grapes. Lemons. Melon. Oranges. Peaches. Plums. Pomegranate.  ? Meats and other protein foods: Beans. Almonds. Sunflower seeds. Pine nuts. Peanuts. Cod. Salmon. Scallops. Shrimp. Tuna. Tilapia. Clams. Oysters. Eggs.  ? Dairy: Low-fat milk. Cheese. Greek yogurt.  ? Beverages: Water. Red wine. Herbal tea.  ? Fats and oils: Extra virgin olive oil. Avocado oil. Grape seed oil.  ? Sweets and desserts: Greek yogurt with honey. Baked apples. Poached pears. Trail mix.  ? Seasoning and other foods: Basil. Cilantro. Coriander. Cumin. Mint. Parsley. Sage. Rosemary. Tarragon. Garlic. Oregano. Thyme. Pepper. Balsalmic vinegar. Tahini. Hummus. Tomato sauce. Olives. Mushrooms.  ? Limit these  ? Grains: Prepackaged pasta or rice dishes. Prepackaged cereal with added sugar.  ? Vegetables: Deep fried potatoes (french fries).  ? Fruits: Fruit canned in syrup.  ? Meats and other protein foods: Beef. Pork. Lamb. Poultry with skin. Hot dogs. Bacon.  ? Dairy: Ice cream. Sour cream. Whole milk.  ? Beverages: Juice. Sugar-sweetened soft drinks. Beer. Liquor and spirits.  ? Fats and oils: Butter. Canola oil. Vegetable oil. Beef fat (tallow). Lard.  ? Sweets and desserts: Cookies. Cakes. Pies. Candy.  ? Seasoning and other foods: Mayonnaise. Premade sauces and marinades.  ? The items listed may not be a complete list. Talk with your dietitian about what dietary choices are right for you.  Summary   The Mediterranean diet includes both food and lifestyle choices.   Eat a variety of fresh fruits and vegetables, beans, nuts, seeds, and whole grains.   Limit the amount of red meat and sweets that you eat.   Talk with your health care provider about whether it is safe for you to drink red wine in moderation. This means 1 glass a day for nonpregnant women and 2 glasses a day for men. A glass of wine equals 5 oz (150 mL).  This information  is not intended to replace advice given to you by your health care provider. Make sure you discuss any questions you have with your health care provider.  Document Released: 09/19/2015 Document Revised: 10/22/2015 Document Reviewed: 09/19/2015  Elsevier Interactive Patient Education  2019 [employer]

## 2018-04-09 ENCOUNTER — Other Ambulatory Visit: Payer: Self-pay | Admitting: Family Medicine

## 2018-04-09 DIAGNOSIS — I10 Essential (primary) hypertension: Secondary | ICD-10-CM

## 2018-05-27 ENCOUNTER — Other Ambulatory Visit: Payer: Self-pay | Admitting: Family Medicine

## 2018-05-27 DIAGNOSIS — M79662 Pain in left lower leg: Secondary | ICD-10-CM

## 2018-05-27 DIAGNOSIS — M1712 Unilateral primary osteoarthritis, left knee: Secondary | ICD-10-CM

## 2018-05-28 ENCOUNTER — Other Ambulatory Visit: Payer: Self-pay | Admitting: Family Medicine

## 2018-05-28 DIAGNOSIS — J301 Allergic rhinitis due to pollen: Secondary | ICD-10-CM

## 2018-06-27 ENCOUNTER — Other Ambulatory Visit: Payer: Self-pay | Admitting: Family Medicine

## 2018-06-27 DIAGNOSIS — I10 Essential (primary) hypertension: Secondary | ICD-10-CM

## 2018-06-29 ENCOUNTER — Other Ambulatory Visit: Payer: Self-pay | Admitting: Family Medicine

## 2018-06-29 DIAGNOSIS — E7849 Other hyperlipidemia: Secondary | ICD-10-CM

## 2018-07-14 DIAGNOSIS — H2511 Age-related nuclear cataract, right eye: Secondary | ICD-10-CM | POA: Diagnosis not present

## 2018-07-14 DIAGNOSIS — H35033 Hypertensive retinopathy, bilateral: Secondary | ICD-10-CM | POA: Diagnosis not present

## 2018-07-14 DIAGNOSIS — H2513 Age-related nuclear cataract, bilateral: Secondary | ICD-10-CM | POA: Diagnosis not present

## 2018-07-14 DIAGNOSIS — H35363 Drusen (degenerative) of macula, bilateral: Secondary | ICD-10-CM | POA: Diagnosis not present

## 2018-07-14 DIAGNOSIS — H25013 Cortical age-related cataract, bilateral: Secondary | ICD-10-CM | POA: Diagnosis not present

## 2018-07-25 IMAGING — MG MM BREAST LOCALIZATION CLIP
4 series · 4 of 4 positions shown · non-contrast
Comparison: Previous exam(s).

CLINICAL DATA: Status post ultrasound-guided core biopsy of a right
breast mass.

EXAM:
DIAGNOSTIC RIGHT MAMMOGRAM POST ULTRASOUND BIOPSY

[R CC (1 of 2)]
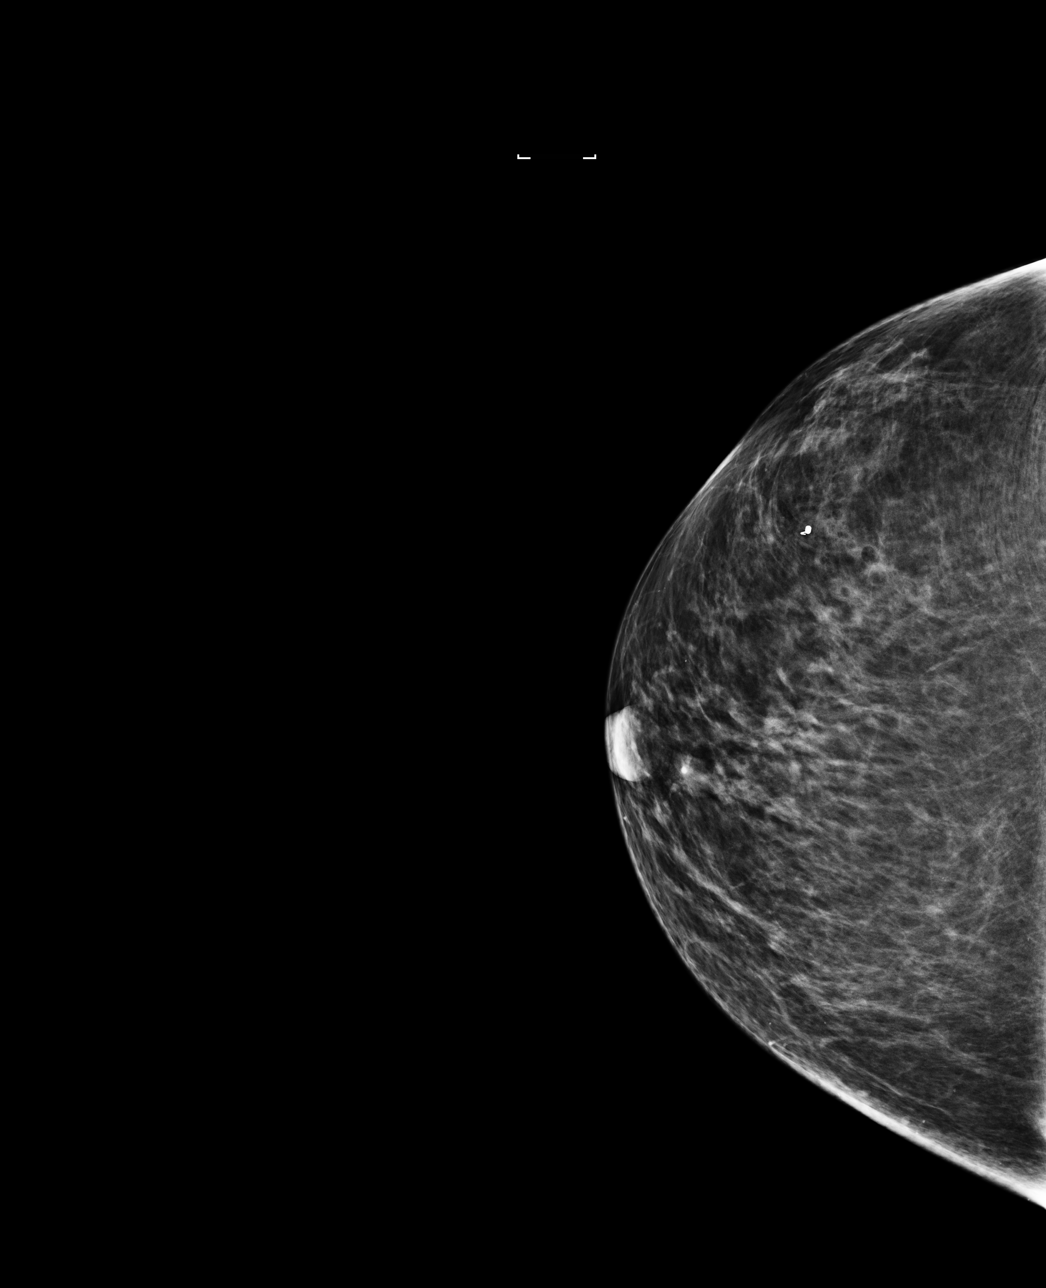

[R ML (1 of 2)]
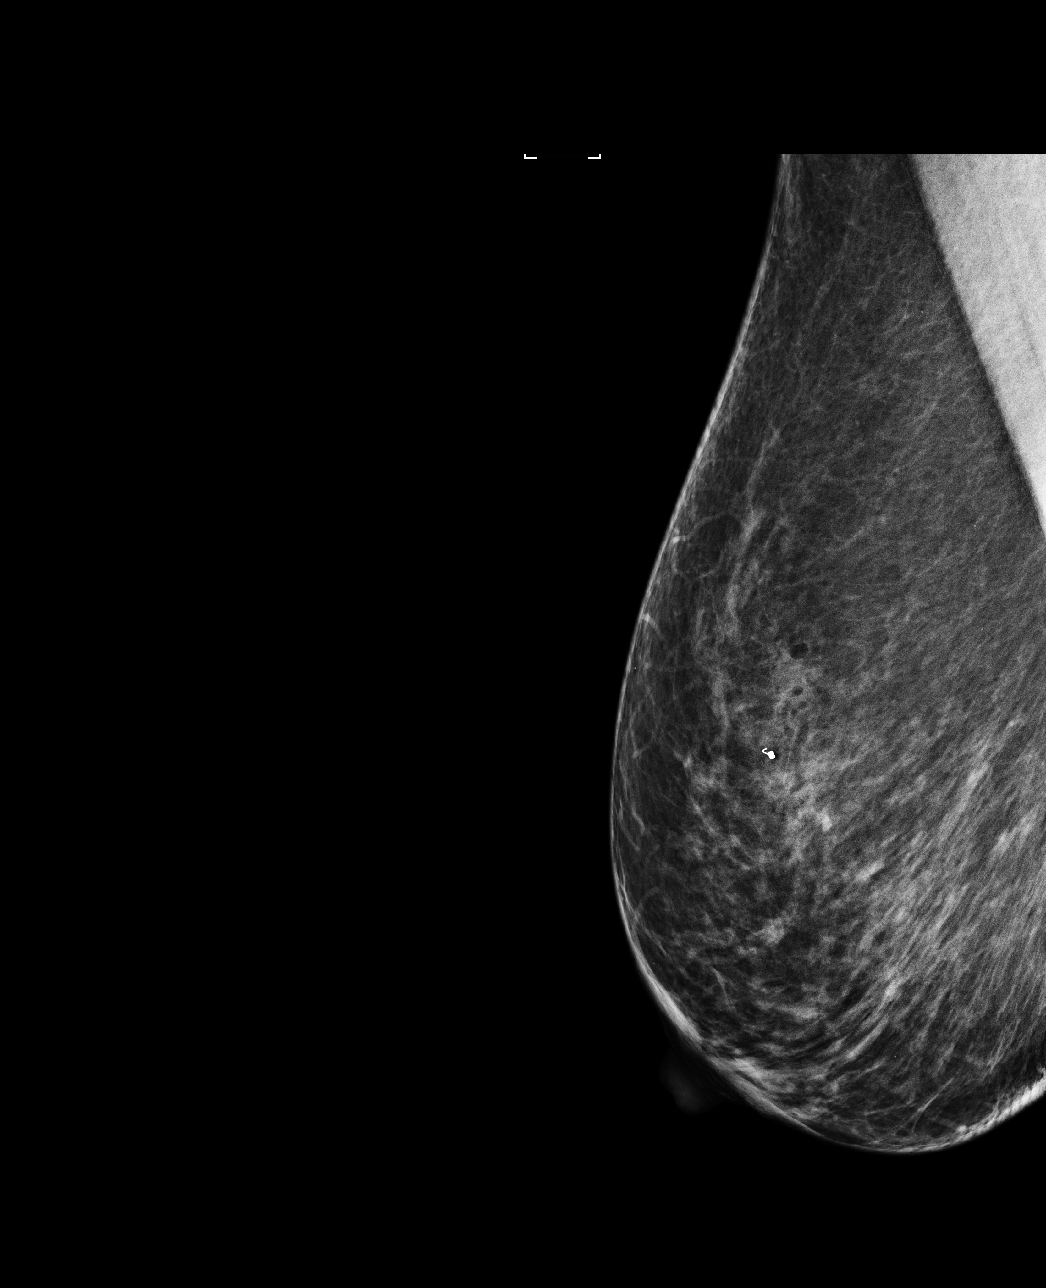

[R CC (2 of 2)]
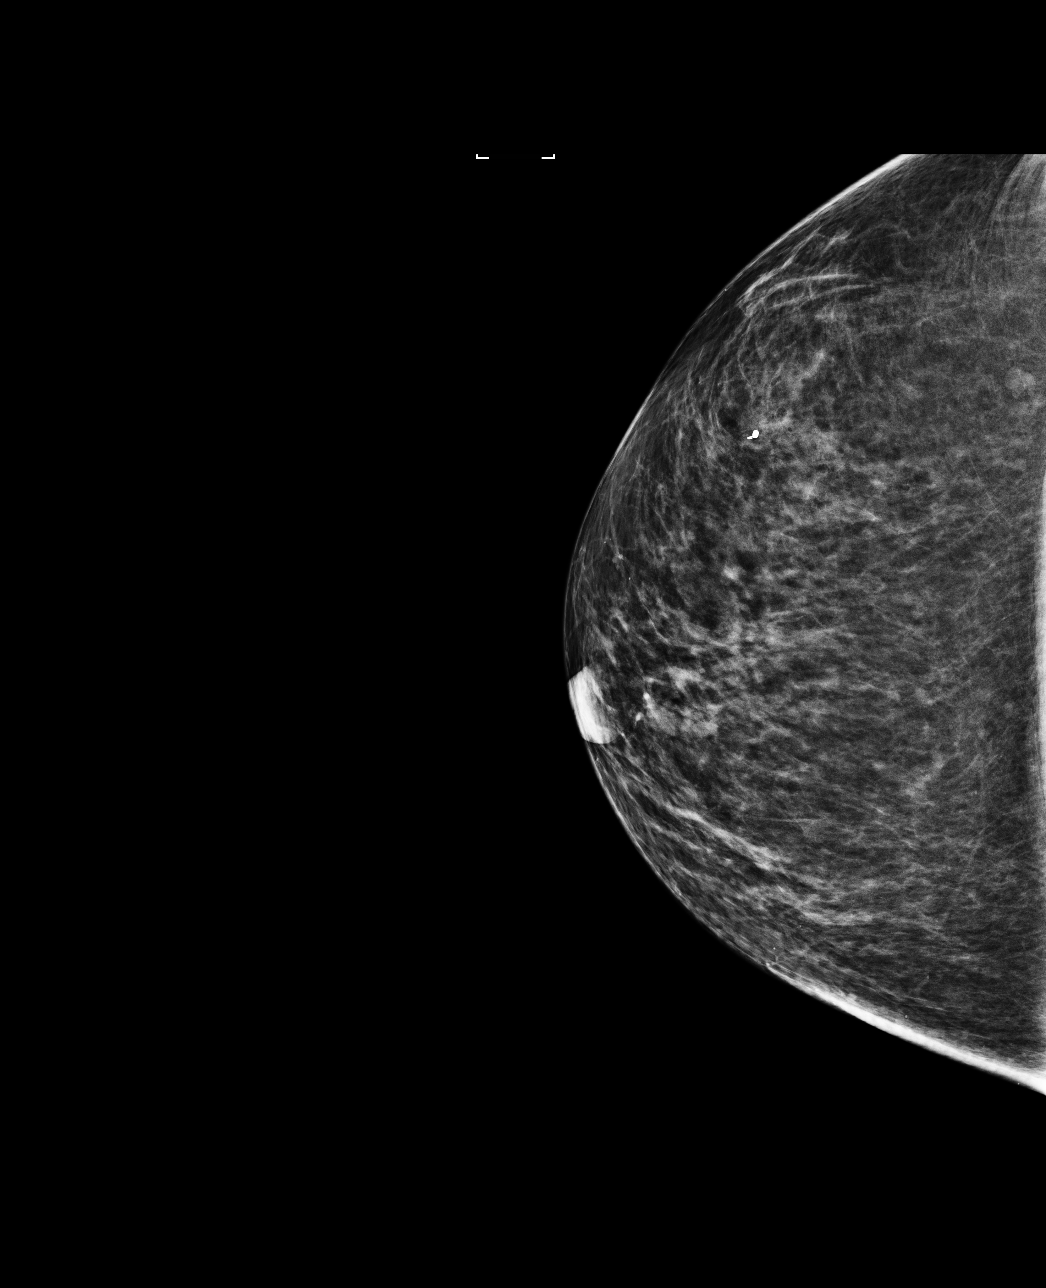

[R ML (2 of 2)]
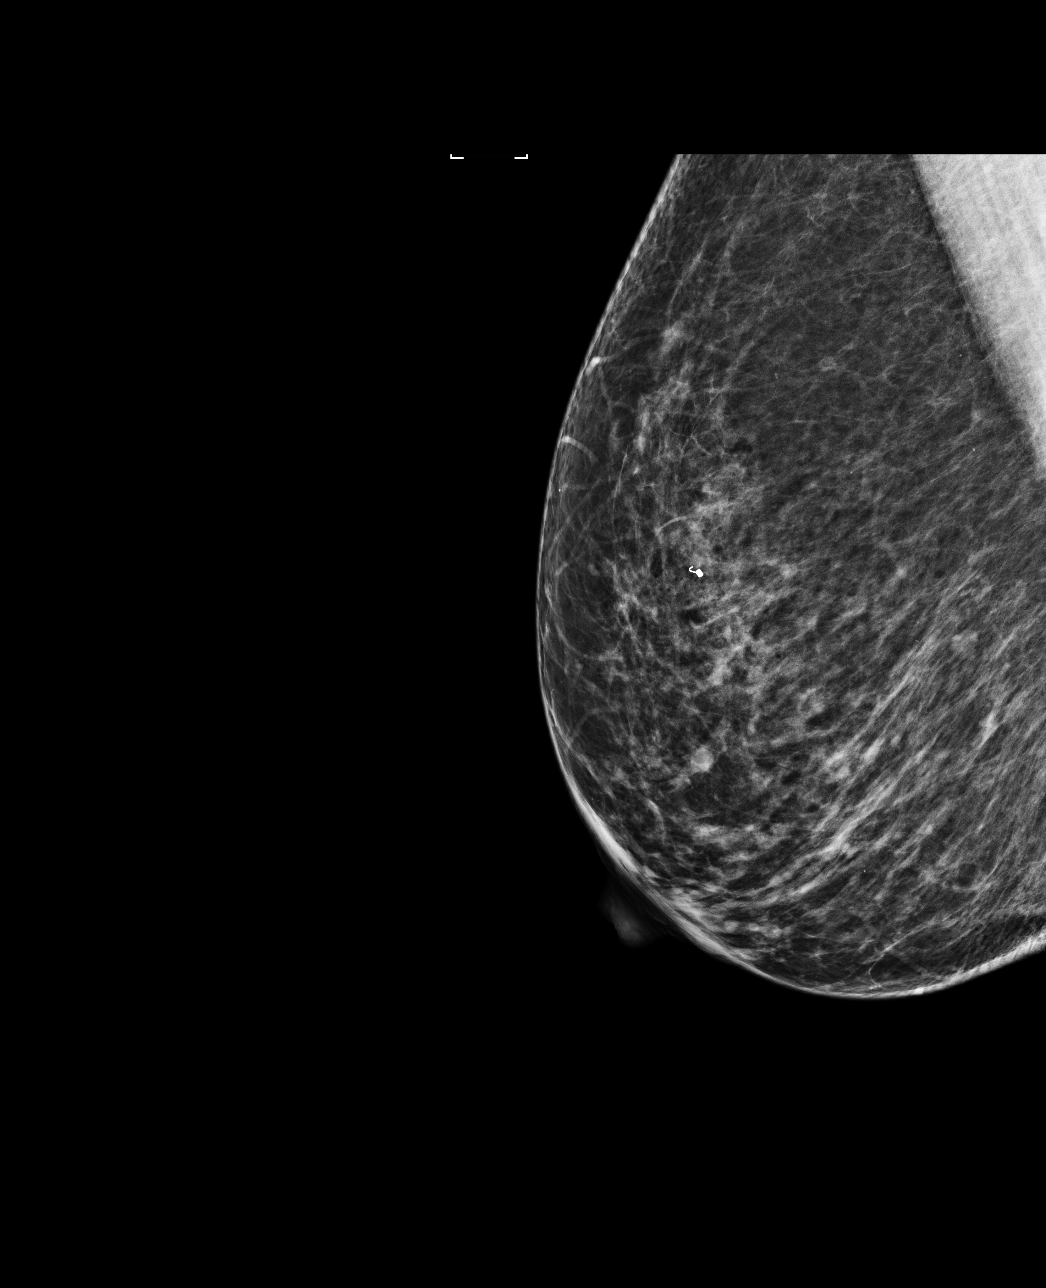

[4 of 4 positions shown; findings below may reference images not displayed]

FINDINGS: Mammographic images were obtained following ultrasound guided biopsy
of a mass in the 10 o'clock region of the right breast 5 cm from the
nipple. Mammographic images show there is a coil shaped clip in the
10 o'clock region of the right breast 5 cm from the nipple. The clip
is located approximately 5.5 cm anterior to the mass seen
mammographically. Second-look ultrasound was performed and no
additional masses were seen in the upper-outer quadrant of the
breast.
IMPRESSION: Status post ultrasound-guided core biopsy of right breast mass with
pathology pending. The mass biopsied sonographically did not
correspond with the mass seen mammographically. Stereotactic biopsy
of the mass seen mammographically will be scheduled at the patient's
convenience.

Final Assessment: Post Procedure Mammograms for Marker Placement

## 2018-07-26 DIAGNOSIS — H2511 Age-related nuclear cataract, right eye: Secondary | ICD-10-CM | POA: Diagnosis not present

## 2018-07-26 DIAGNOSIS — H25811 Combined forms of age-related cataract, right eye: Secondary | ICD-10-CM | POA: Diagnosis not present

## 2018-08-02 DIAGNOSIS — H2511 Age-related nuclear cataract, right eye: Secondary | ICD-10-CM | POA: Diagnosis not present

## 2018-08-11 ENCOUNTER — Other Ambulatory Visit: Payer: Self-pay | Admitting: Family Medicine

## 2018-08-11 DIAGNOSIS — Z1231 Encounter for screening mammogram for malignant neoplasm of breast: Secondary | ICD-10-CM

## 2018-08-17 DIAGNOSIS — H2512 Age-related nuclear cataract, left eye: Secondary | ICD-10-CM | POA: Diagnosis not present

## 2018-08-17 DIAGNOSIS — H25012 Cortical age-related cataract, left eye: Secondary | ICD-10-CM | POA: Diagnosis not present

## 2018-08-20 ENCOUNTER — Other Ambulatory Visit: Payer: Self-pay | Admitting: Family Medicine

## 2018-08-20 DIAGNOSIS — J4521 Mild intermittent asthma with (acute) exacerbation: Secondary | ICD-10-CM

## 2018-08-23 DIAGNOSIS — H2512 Age-related nuclear cataract, left eye: Secondary | ICD-10-CM | POA: Diagnosis not present

## 2018-08-23 DIAGNOSIS — H25012 Cortical age-related cataract, left eye: Secondary | ICD-10-CM | POA: Diagnosis not present

## 2018-08-23 DIAGNOSIS — H25812 Combined forms of age-related cataract, left eye: Secondary | ICD-10-CM | POA: Diagnosis not present

## 2018-08-30 DIAGNOSIS — H2512 Age-related nuclear cataract, left eye: Secondary | ICD-10-CM | POA: Diagnosis not present

## 2018-09-01 ENCOUNTER — Ambulatory Visit
Admission: RE | Admit: 2018-09-01 | Discharge: 2018-09-01 | Disposition: A | Discharge status: Home / Self Care | Payer: BC Managed Care – PPO | Source: Ambulatory Visit | Attending: Family Medicine | Admitting: Family Medicine

## 2018-09-01 ENCOUNTER — Other Ambulatory Visit: Payer: Self-pay

## 2018-09-01 ENCOUNTER — Encounter (INDEPENDENT_AMBULATORY_CARE_PROVIDER_SITE_OTHER): Payer: Self-pay

## 2018-09-01 DIAGNOSIS — Z1231 Encounter for screening mammogram for malignant neoplasm of breast: Secondary | ICD-10-CM | POA: Diagnosis not present

## 2018-09-04 ENCOUNTER — Other Ambulatory Visit: Payer: Self-pay | Admitting: Family Medicine

## 2018-09-04 DIAGNOSIS — M79662 Pain in left lower leg: Secondary | ICD-10-CM

## 2018-09-04 DIAGNOSIS — M1712 Unilateral primary osteoarthritis, left knee: Secondary | ICD-10-CM

## 2018-09-20 ENCOUNTER — Other Ambulatory Visit: Payer: Self-pay | Admitting: Family Medicine

## 2018-09-20 DIAGNOSIS — I10 Essential (primary) hypertension: Secondary | ICD-10-CM

## 2018-09-21 ENCOUNTER — Other Ambulatory Visit: Payer: Self-pay | Admitting: Family Medicine

## 2018-09-21 DIAGNOSIS — E7849 Other hyperlipidemia: Secondary | ICD-10-CM

## 2018-09-23 ENCOUNTER — Other Ambulatory Visit: Payer: Self-pay | Admitting: Family Medicine

## 2018-09-23 DIAGNOSIS — K219 Gastro-esophageal reflux disease without esophagitis: Secondary | ICD-10-CM

## 2018-09-26 ENCOUNTER — Other Ambulatory Visit: Payer: Self-pay

## 2018-09-26 ENCOUNTER — Encounter: Payer: Self-pay | Admitting: Family Medicine

## 2018-09-26 ENCOUNTER — Ambulatory Visit (INDEPENDENT_AMBULATORY_CARE_PROVIDER_SITE_OTHER): Payer: BC Managed Care – PPO | Admitting: Family Medicine

## 2018-09-26 DIAGNOSIS — M79662 Pain in left lower leg: Secondary | ICD-10-CM

## 2018-09-26 DIAGNOSIS — M1712 Unilateral primary osteoarthritis, left knee: Secondary | ICD-10-CM | POA: Diagnosis not present

## 2018-09-26 DIAGNOSIS — I10 Essential (primary) hypertension: Secondary | ICD-10-CM

## 2018-09-26 DIAGNOSIS — E7849 Other hyperlipidemia: Secondary | ICD-10-CM

## 2018-09-26 MED ORDER — LOSARTAN POTASSIUM 25 MG PO TABS: 50.0000 mg | ORAL_TABLET | Freq: Every day | ORAL | 1 refills | Status: DC

## 2018-09-26 MED ORDER — CELECOXIB 200 MG PO CAPS: ORAL_CAPSULE | ORAL | 2 refills | Status: DC

## 2018-09-26 MED ORDER — SIMVASTATIN 20 MG PO TABS: 20.0000 mg | ORAL_TABLET | Freq: Every day | ORAL | 1 refills | Status: DC

## 2018-09-26 MED ORDER — AMLODIPINE BESYLATE 5 MG PO TABS: 5.0000 mg | ORAL_TABLET | Freq: Every day | ORAL | 1 refills | Status: DC

## 2018-09-26 MED ORDER — HYDROCHLOROTHIAZIDE 50 MG PO TABS: 50.0000 mg | ORAL_TABLET | Freq: Every day | ORAL | 1 refills | Status: DC

## 2018-09-26 NOTE — Progress Notes (Signed)
Date:  09/26/2018   Name:  Susan Gregory   DOB:  May 11, 1960   MRN:  [id number]   Chief Complaint: Hypertension, Hyperlipidemia, and Arthritis  Hypertension This is a chronic problem. The problem has been gradually improving since onset. The problem is controlled. Pertinent negatives include no anxiety, blurred vision, chest pain, headaches, malaise/fatigue, neck pain, orthopnea, palpitations, peripheral edema, PND, shortness of breath or sweats. There are no associated agents to hypertension. There are no known risk factors for coronary artery disease. Past treatments include angiotensin blockers, calcium channel blockers and diuretics. The current treatment provides moderate improvement. There are no compliance problems.  There is no history of angina, kidney disease, CAD/MI, CVA, heart failure, left ventricular hypertrophy or PVD. There is no history of chronic renal disease, a hypertension causing med or renovascular disease.  Hyperlipidemia This is a chronic problem. The current episode started more than 1 year ago. The problem is controlled. Recent lipid tests were reviewed and are normal. She has no history of chronic renal disease, diabetes, hypothyroidism, liver disease or obesity. Pertinent negatives include no chest pain, myalgias or shortness of breath. Current antihyperlipidemic treatment includes statins. The current treatment provides moderate improvement of lipids. There are no compliance problems.  Risk factors for coronary artery disease include hypertension, dyslipidemia and post-menopausal.  Arthritis Presents for follow-up visit. She complains of pain and stiffness. Symptom course: waxes and wanes. Affected locations include the left hip. Pertinent negatives include no diarrhea, dysuria, fatigue, fever, pain at night, pain while resting, rash or weight loss.    Review of Systems  Constitutional: Negative.  Negative for chills, fatigue, fever, malaise/fatigue, unexpected weight  change and weight loss.  HENT: Negative for congestion, ear discharge, ear pain, mouth sores, nosebleeds, postnasal drip, rhinorrhea, sinus pressure, sneezing and sore throat.   Eyes: Negative for blurred vision, photophobia, pain, discharge, redness and itching.  Respiratory: Negative for cough, choking, chest tightness, shortness of breath, wheezing and stridor.   Cardiovascular: Negative for chest pain, palpitations, orthopnea and PND.  Gastrointestinal: Negative for abdominal pain, blood in stool, constipation, diarrhea, nausea and vomiting.  Endocrine: Negative for cold intolerance, heat intolerance, polydipsia, polyphagia and polyuria.  Genitourinary: Negative for dysuria, flank pain, frequency, hematuria, menstrual problem, pelvic pain, urgency, vaginal bleeding and vaginal discharge.  Musculoskeletal: Positive for arthritis and stiffness. Negative for arthralgias, back pain, myalgias and neck pain.  Skin: Negative for rash.  Allergic/Immunologic: Negative for environmental allergies and food allergies.  Neurological: Negative for dizziness, speech difficulty, weakness, light-headedness, numbness and headaches.  Hematological: Negative for adenopathy. Does not bruise/bleed easily.  Psychiatric/Behavioral: Negative for dysphoric mood. The patient is not nervous/anxious.     Patient Active Problem List   Diagnosis Date Noted  . Familial multiple lipoprotein-type hyperlipidemia 07/18/2014  . Disorder of lipoprotein and lipid metabolism 07/18/2014  . Benign hypertension 07/18/2014  . Acid reflux 07/18/2014  . Encounter for general adult medical examination without abnormal findings 07/18/2014  . Neck sprain and strain 07/18/2014  . Abnormal breast finding 07/18/2014  . Abnormal finding on breast imaging 07/18/2014  . Arthritis of knee, degenerative 07/18/2014  . TB skin/subcutaneous 07/18/2014  . Fecal occult blood test positive 11/01/2013    No Known Allergies  Past Surgical  History:  Procedure Laterality Date  . BREAST BIOPSY Right 09/04/2016   US guided right breast biopsy. Coil marker neg  . BREAST BIOPSY Right 09/07/2016   stereo biopsy. Ribbon marker neg  . ESOPHAGOGASTRODUODENOSCOPY (EGD) WITH PROPOFOL N/A 04/20/2016  Procedure: ESOPHAGOGASTRODUODENOSCOPY (EGD) WITH PROPOFOL;  Surgeon: Christena DeemMartin U Skulskie, MD;  [facility];  Service: Endoscopy;  Laterality: N/A;  . THROAT SURGERY     nodules taken off throat  . TUBAL LIGATION      Social History   Tobacco Use  . Smoking status: Never Smoker  . Smokeless tobacco: Never Used  Substance Use Topics  . Alcohol use: No    Alcohol/week: 0.0 standard drinks  . Drug use: No     Medication list has been reviewed and updated.  Current Meds  Medication Sig  . amLODipine (NORVASC) 5 MG tablet Take 1 tablet (5 mg total) by mouth daily.  Marland Kitchen. aspirin 81 MG tablet Take 1 tablet by mouth daily.  . celecoxib (CELEBREX) 200 MG capsule TAKE 1 CAPSULE BY MOUTH EVERY DAY  . clobetasol (TEMOVATE) 0.05 % external solution Apply 1 application topically 2 (two) times daily.  Marland Kitchen. griseofulvin (GRIFULVIN V) 500 MG tablet Take 500 mg by mouth 2 (two) times daily. Dr Cheree DittoGraham  . hydrochlorothiazide (HYDRODIURIL) 25 MG tablet Take 1 tablet (25 mg total) by mouth daily.  Marland Kitchen. losartan (COZAAR) 25 MG tablet Take 2 tablets (50 mg total) by mouth daily.  . pantoprazole (PROTONIX) 40 MG tablet Take 2 tablets (80 mg total) by mouth daily. Dr Marva PandaSkulskie  . simvastatin (ZOCOR) 20 MG tablet TAKE 1 TABLET BY MOUTH EVERY DAY  . [DISCONTINUED] fluticasone (FLONASE) 50 MCG/ACT nasal spray SPRAY 2 SPRAYS INTO EACH NOSTRIL EVERY DAY    PHQ 2/9 Scores 12/28/2016 07/19/2014  PHQ - 2 Score 0 0  PHQ- 9 Score 0 -    BP Readings from Last 3 Encounters:  09/26/18 130/64  03/31/18 123/81  02/11/18 120/80    Physical Exam Vitals signs and nursing note reviewed.  Constitutional:      Appearance: She is well-developed and normal  weight.  HENT:     Head: Normocephalic.     Right Ear: Tympanic membrane, ear canal and external ear normal.     Left Ear: Tympanic membrane, ear canal and external ear normal.     Nose: Nose normal. No congestion.     Mouth/Throat:     Mouth: Mucous membranes are dry.  Eyes:     General: Lids are everted, no foreign bodies appreciated. No scleral icterus.       Left eye: No foreign body or hordeolum.     Conjunctiva/sclera: Conjunctivae normal.     Right eye: Right conjunctiva is not injected.     Left eye: Left conjunctiva is not injected.     Pupils: Pupils are equal, round, and reactive to light.  Neck:     Musculoskeletal: Normal range of motion and neck supple.     Thyroid: No thyromegaly.     Vascular: No JVD.     Trachea: No tracheal deviation.  Cardiovascular:     Rate and Rhythm: Normal rate and regular rhythm.     Pulses: Normal pulses.     Heart sounds: Normal heart sounds. No murmur. No friction rub. No gallop.   Pulmonary:     Effort: Pulmonary effort is normal. No respiratory distress.     Breath sounds: Normal breath sounds. No wheezing or rales.  Abdominal:     General: Bowel sounds are normal.     Palpations: Abdomen is soft. There is no mass.     Tenderness: There is no abdominal tenderness. There is no guarding or rebound.  Musculoskeletal: Normal range of motion.  General: No tenderness.  Lymphadenopathy:     Cervical: No cervical adenopathy.  Skin:    General: Skin is warm.     Findings: No rash.     Comments: Venous stasis changes  Neurological:     Mental Status: She is alert and oriented to person, place, and time.     Cranial Nerves: No cranial nerve deficit.     Deep Tendon Reflexes: Reflexes normal.  Psychiatric:        Mood and Affect: Mood is not anxious or depressed.     Wt Readings from Last 3 Encounters:  09/26/18 215 lb (97.5 kg)  03/31/18 207 lb (93.9 kg)  02/11/18 209 lb (94.8 kg)    BP 130/64   Pulse 76   Ht 5\' 4"   (1.626 m)   Wt 215 lb (97.5 kg)   LMP  (LMP Unknown) Comment: menopausal, denies preg  BMI 36.90 kg/m    Assessment and Plan: 1. Benign hypertension Chronic.  Controlled.  Dose change has been made on today's visit.  Patient has been taking an additional 12.5 for control of it although is not quite reach the goal that she has set for her blood pressure told patient that we can have the the go to 2 pills or I can increase to 50 mg and her preference is to go to 50 mg hydrochlorothiazide.  In addition we would like to continue amlodipine 5 mg once a day and losartan 25 mg 2 tablets daily.  Will check comprehensive metabolic panel to assess hepatic function as well as renal toxicity.  Will follow potassium because of the increase hydrochlorothiazide. - Comprehensive Metabolic Panel (CMET) - amLODipine (NORVASC) 5 MG tablet; Take 1 tablet (5 mg total) by mouth daily.  Dispense: 90 tablet; Refill: 1 - losartan (COZAAR) 25 MG tablet; Take 2 tablets (50 mg total) by mouth daily.  Dispense: 189 tablet; Refill: 1 - hydrochlorothiazide (HYDRODIURIL) 50 MG tablet; Take 1 tablet (50 mg total) by mouth daily.  Dispense: 90 tablet; Refill: 1  2. Familial multiple lipoprotein-type hyperlipidemia Chronic.  Controlled.  Continue simvastatin 20 mg once a day.  Will check lipid panel. - Lipid Panel With LDL/HDL Ratio - simvastatin (ZOCOR) 20 MG tablet; Take 1 tablet (20 mg total) by mouth daily.  Dispense: 90 tablet; Refill: 1  3. Primary osteoarthritis of left knee Patient has ongoing osteoarthritis left knee for which we are going to continue the Celebrex for which he takes on an infrequent basis. - celecoxib (CELEBREX) 200 MG capsule; TAKE 1 CAPSULE BY MOUTH EVERY DAY  Dispense: 30 capsule; Refill: 2  4. Pain of left lower leg Pain also extends from the left hip down to the knee may consistent with a iliotibial band or referred pain from the hip will continue Celebrex 200 mg once a day. - celecoxib  (CELEBREX) 200 MG capsule; TAKE 1 CAPSULE BY MOUTH EVERY DAY  Dispense: 30 capsule; Refill: 2

## 2018-09-26 NOTE — Patient Instructions (Signed)

## 2018-09-27 LAB — LIPID PANEL WITH LDL/HDL RATIO
Cholesterol, Total: 180 mg/dL (ref 100–199)
HDL: 54 mg/dL (ref >39)
LDL Calculated: 103 mg/dL — ABNORMAL HIGH (ref 0–99)
LDl/HDL Ratio: 1.9 ratio (ref 0.0–3.2)
Triglycerides: 116 mg/dL (ref 0–149)
VLDL Cholesterol Cal: 23 mg/dL (ref 5–40)

## 2018-09-27 LAB — COMPREHENSIVE METABOLIC PANEL
ALT: 22 IU/L (ref 0–32)
AST: 18 IU/L (ref 0–40)
Albumin/Globulin Ratio: 1.2 (ref 1.2–2.2)
Albumin: 4.2 g/dL (ref 3.8–4.9)
Alkaline Phosphatase: 68 IU/L (ref 39–117)
BUN/Creatinine Ratio: 14 (ref 12–28)
BUN: 13 mg/dL (ref 8–27)
Bilirubin Total: 0.5 mg/dL (ref 0.0–1.2)
CO2: 25 mmol/L (ref 20–29)
Calcium: 9.9 mg/dL (ref 8.7–10.3)
Chloride: 101 mmol/L (ref 96–106)
Creatinine, Ser: 0.92 mg/dL (ref 0.57–1.00)
GFR calc Af Amer: 78 mL/min/{1.73_m2} (ref >59)
GFR calc non Af Amer: 68 mL/min/{1.73_m2} (ref >59)
Globulin, Total: 3.5 g/dL (ref 1.5–4.5)
Glucose: 75 mg/dL (ref 65–99)
Potassium: 3.8 mmol/L (ref 3.5–5.2)
Sodium: 142 mmol/L (ref 134–144)
Total Protein: 7.7 g/dL (ref 6.0–8.5)

## 2018-10-13 DIAGNOSIS — L678 Other hair color and hair shaft abnormalities: Secondary | ICD-10-CM | POA: Diagnosis not present

## 2018-10-13 DIAGNOSIS — L853 Xerosis cutis: Secondary | ICD-10-CM | POA: Diagnosis not present

## 2018-10-22 ENCOUNTER — Other Ambulatory Visit: Payer: Self-pay | Admitting: Family Medicine

## 2018-10-22 DIAGNOSIS — I10 Essential (primary) hypertension: Secondary | ICD-10-CM

## 2018-11-01 ENCOUNTER — Ambulatory Visit (INDEPENDENT_AMBULATORY_CARE_PROVIDER_SITE_OTHER): Payer: BC Managed Care – PPO

## 2018-11-01 ENCOUNTER — Other Ambulatory Visit: Payer: Self-pay

## 2018-11-01 DIAGNOSIS — Z23 Encounter for immunization: Secondary | ICD-10-CM | POA: Diagnosis not present

## 2018-11-15 ENCOUNTER — Other Ambulatory Visit
Admission: RE | Admit: 2018-11-15 | Discharge: 2018-11-15 | Disposition: A | Discharge status: Home / Self Care | Payer: BC Managed Care – PPO | Source: Ambulatory Visit | Attending: Gastroenterology | Admitting: Gastroenterology

## 2018-11-15 ENCOUNTER — Other Ambulatory Visit: Payer: Self-pay

## 2018-11-15 DIAGNOSIS — Z20828 Contact with and (suspected) exposure to other viral communicable diseases: Secondary | ICD-10-CM | POA: Insufficient documentation

## 2018-11-15 DIAGNOSIS — Z01812 Encounter for preprocedural laboratory examination: Secondary | ICD-10-CM | POA: Diagnosis not present

## 2018-11-15 LAB — SARS CORONAVIRUS 2 (TAT 6-24 HRS): SARS Coronavirus 2: NEGATIVE

## 2018-11-18 ENCOUNTER — Encounter
Admission: RE | Disposition: A | Discharge status: Home / Self Care | Payer: Self-pay | Source: Home / Self Care | Attending: Gastroenterology

## 2018-11-18 ENCOUNTER — Ambulatory Visit
Admission: RE | Admit: 2018-11-18 | Discharge: 2018-11-18 | Disposition: A | Discharge status: Home / Self Care | Payer: BC Managed Care – PPO | Attending: Gastroenterology | Admitting: Gastroenterology

## 2018-11-18 ENCOUNTER — Ambulatory Visit: Payer: BC Managed Care – PPO | Admitting: Anesthesiology

## 2018-11-18 ENCOUNTER — Other Ambulatory Visit: Payer: Self-pay

## 2018-11-18 ENCOUNTER — Encounter: Payer: Self-pay | Admitting: *Deleted

## 2018-11-18 DIAGNOSIS — K219 Gastro-esophageal reflux disease without esophagitis: Secondary | ICD-10-CM | POA: Insufficient documentation

## 2018-11-18 DIAGNOSIS — I10 Essential (primary) hypertension: Secondary | ICD-10-CM | POA: Insufficient documentation

## 2018-11-18 DIAGNOSIS — B9681 Helicobacter pylori [H. pylori] as the cause of diseases classified elsewhere: Secondary | ICD-10-CM | POA: Insufficient documentation

## 2018-11-18 DIAGNOSIS — K3189 Other diseases of stomach and duodenum: Secondary | ICD-10-CM | POA: Diagnosis not present

## 2018-11-18 DIAGNOSIS — Z8371 Family history of colonic polyps: Secondary | ICD-10-CM | POA: Diagnosis not present

## 2018-11-18 DIAGNOSIS — Z791 Long term (current) use of non-steroidal anti-inflammatories (NSAID): Secondary | ICD-10-CM | POA: Diagnosis not present

## 2018-11-18 DIAGNOSIS — E785 Hyperlipidemia, unspecified: Secondary | ICD-10-CM | POA: Diagnosis not present

## 2018-11-18 DIAGNOSIS — K2270 Barrett's esophagus without dysplasia: Secondary | ICD-10-CM | POA: Diagnosis not present

## 2018-11-18 DIAGNOSIS — Z8719 Personal history of other diseases of the digestive system: Secondary | ICD-10-CM | POA: Diagnosis not present

## 2018-11-18 DIAGNOSIS — Z800 Family history of malignant neoplasm of digestive organs: Secondary | ICD-10-CM | POA: Insufficient documentation

## 2018-11-18 DIAGNOSIS — K2970 Gastritis, unspecified, without bleeding: Secondary | ICD-10-CM | POA: Diagnosis not present

## 2018-11-18 DIAGNOSIS — K2950 Unspecified chronic gastritis without bleeding: Secondary | ICD-10-CM | POA: Insufficient documentation

## 2018-11-18 DIAGNOSIS — Z1211 Encounter for screening for malignant neoplasm of colon: Secondary | ICD-10-CM | POA: Diagnosis not present

## 2018-11-18 DIAGNOSIS — K635 Polyp of colon: Secondary | ICD-10-CM | POA: Insufficient documentation

## 2018-11-18 DIAGNOSIS — Z79899 Other long term (current) drug therapy: Secondary | ICD-10-CM | POA: Diagnosis not present

## 2018-11-18 DIAGNOSIS — Z09 Encounter for follow-up examination after completed treatment for conditions other than malignant neoplasm: Secondary | ICD-10-CM | POA: Diagnosis not present

## 2018-11-18 DIAGNOSIS — Z7982 Long term (current) use of aspirin: Secondary | ICD-10-CM | POA: Diagnosis not present

## 2018-11-18 DIAGNOSIS — K208 Other esophagitis without bleeding: Secondary | ICD-10-CM | POA: Diagnosis not present

## 2018-11-18 HISTORY — PX: COLONOSCOPY WITH PROPOFOL: SHX5780

## 2018-11-18 HISTORY — PX: ESOPHAGOGASTRODUODENOSCOPY (EGD) WITH PROPOFOL: SHX5813

## 2018-11-18 SURGERY — ESOPHAGOGASTRODUODENOSCOPY (EGD) WITH PROPOFOL
Anesthesia: General

## 2018-11-18 MED ORDER — MIDAZOLAM HCL 5 MG/5ML IJ SOLN
INTRAMUSCULAR | Status: DC | PRN
  Administered 2018-11-18: 2 mg via INTRAVENOUS

## 2018-11-18 MED ORDER — PROPOFOL 500 MG/50ML IV EMUL
INTRAVENOUS | Status: DC | PRN
  Administered 2018-11-18: 50 ug/kg/min via INTRAVENOUS

## 2018-11-18 MED ORDER — GLYCOPYRROLATE 0.2 MG/ML IJ SOLN
INTRAMUSCULAR | Status: AC
  Filled 2018-11-18: qty 1

## 2018-11-18 MED ORDER — LIDOCAINE HCL (PF) 2 % IJ SOLN
INTRAMUSCULAR | Status: AC
  Filled 2018-11-18: qty 10

## 2018-11-18 MED ORDER — MIDAZOLAM HCL 2 MG/2ML IJ SOLN
INTRAMUSCULAR | Status: AC
  Filled 2018-11-18: qty 2

## 2018-11-18 MED ORDER — FENTANYL CITRATE (PF) 100 MCG/2ML IJ SOLN
INTRAMUSCULAR | Status: AC
  Filled 2018-11-18: qty 2

## 2018-11-18 MED ORDER — PROPOFOL 10 MG/ML IV BOLUS
INTRAVENOUS | Status: DC | PRN
  Administered 2018-11-18: 20 mg via INTRAVENOUS
  Administered 2018-11-18: 30 mg via INTRAVENOUS

## 2018-11-18 MED ORDER — LIDOCAINE HCL (PF) 2 % IJ SOLN
INTRAMUSCULAR | Status: DC | PRN
  Administered 2018-11-18: 100 mg

## 2018-11-18 MED ORDER — SODIUM CHLORIDE 0.9 % IV SOLN
INTRAVENOUS | Status: DC
  Administered 2018-11-18: 07:00:00 via INTRAVENOUS

## 2018-11-18 MED ORDER — SUCCINYLCHOLINE CHLORIDE 20 MG/ML IJ SOLN
INTRAMUSCULAR | Status: AC
  Filled 2018-11-18: qty 1

## 2018-11-18 MED ORDER — FENTANYL CITRATE (PF) 100 MCG/2ML IJ SOLN
INTRAMUSCULAR | Status: DC | PRN
  Administered 2018-11-18 (×2): 50 ug via INTRAVENOUS

## 2018-11-18 MED ORDER — PROPOFOL 500 MG/50ML IV EMUL
INTRAVENOUS | Status: AC
  Filled 2018-11-18: qty 50

## 2018-11-18 MED ORDER — PHENYLEPHRINE HCL (PRESSORS) 10 MG/ML IV SOLN
INTRAVENOUS | Status: AC
  Filled 2018-11-18: qty 1

## 2018-11-18 MED ORDER — EPHEDRINE SULFATE 50 MG/ML IJ SOLN
INTRAMUSCULAR | Status: AC
  Filled 2018-11-18: qty 1

## 2018-11-18 NOTE — Anesthesia Preprocedure Evaluation (Signed)
Anesthesia Evaluation  Patient identified by MRN, date of birth, ID band Patient awake    Reviewed: Allergy & Precautions, H&P , NPO status , Patient's Chart, lab work & pertinent test results, reviewed documented beta blocker date and time   History of Anesthesia Complications Negative for: history of anesthetic complications  Airway Mallampati: III  TM Distance: >3 FB Neck ROM: full    Dental  (+) Caps, Missing, Dental Advidsory Given   Pulmonary neg pulmonary ROS,           Cardiovascular Exercise Tolerance: Good hypertension, On Medications (-) angina(-) CAD, (-) Past MI, (-) Cardiac Stents and (-) CABG (-) dysrhythmias (-) Valvular Problems/Murmurs     Neuro/Psych negative neurological ROS  negative psych ROS   GI/Hepatic Neg liver ROS, GERD  ,  Endo/Other  negative endocrine ROS  Renal/GU negative Renal ROS  negative genitourinary   Musculoskeletal   Abdominal   Peds  Hematology negative hematology ROS (+)   Anesthesia Other Findings Past Medical History: No date: Allergy No date: Barrett's esophagus No date: GERD (gastroesophageal reflux disease) No date: Hyperlipidemia No date: Hypertension   Reproductive/Obstetrics negative OB ROS                             Anesthesia Physical  Anesthesia Plan  ASA: II  Anesthesia Plan: General   Post-op Pain Management:    Induction: Intravenous  PONV Risk Score and Plan: 3 and Propofol infusion and TIVA  Airway Management Planned: Natural Airway and Nasal Cannula  Additional Equipment:   Intra-op Plan:   Post-operative Plan:   Informed Consent: I have reviewed the patients History and Physical, chart, labs and discussed the procedure including the risks, benefits and alternatives for the proposed anesthesia with the patient or authorized representative who has indicated his/her understanding and acceptance.     Dental  Advisory Given  Plan Discussed with: Anesthesiologist, CRNA and Surgeon  Anesthesia Plan Comments:         Anesthesia Quick Evaluation

## 2018-11-18 NOTE — Op Note (Signed)
[facility] Gastroenterology Patient Name: Susan Gregory Procedure Date: 11/18/2018 7:27 AM MRN: [id number] Account #: [id number] Date of Birth: 20-Jan-1961 Admit Type: Outpatient Age: 58 Room: [facility] ENDO ROOM 3 Gender: Female Note Status: Finalized Procedure:            Colonoscopy Indications:          Family history of colon cancer, Family history of                        colonic polyps in a first-degree relative Providers:            Lollie Sails, MD Medicines:            Monitored Anesthesia Care Complications:        No immediate complications. Procedure:            Pre-Anesthesia Assessment:                       - ASA Grade Assessment: II - A patient with mild                        systemic disease.                       After obtaining informed consent, the colonoscope was                        passed under direct vision. Throughout the procedure,                        the patient's blood pressure, pulse, and oxygen                        saturations were monitored continuously. The                        Colonoscope was introduced through the anus and                        advanced to the the cecum, identified by appendiceal                        orifice and ileocecal valve. The colonoscopy was                        performed without difficulty. The patient tolerated the                        procedure well. The quality of the bowel preparation                        was good. Findings:      A 2 mm polyp was found in the proximal sigmoid colon. The polyp was       sessile. The polyp was removed with a cold biopsy forceps. Resection and       retrieval were complete.      The exam was otherwise normal throughout the examined colon.      The retroflexed view of the distal rectum and anal verge was normal and       showed no anal or rectal abnormalities.  The digital rectal exam was normal. Impression:           - One 2 mm polyp in the  proximal sigmoid colon, removed                        with a cold biopsy forceps. Resected and retrieved.                       - The distal rectum and anal verge are normal on                        retroflexion view. Recommendation:       - Discharge patient to home.                       - Advance diet as tolerated. Procedure Code(s):    --- Professional ---                       [phone], Colonoscopy, flexible; with biopsy, single or                        multiple Diagnosis Code(s):    --- Professional ---                       K63.5, Polyp of colon                       Z80.0, Family history of malignant neoplasm of                        digestive organs                       Z83.71, Family history of colonic polyps CPT copyright 2019 [employer]. All rights reserved. The codes documented in this report are preliminary and upon coder review may  be revised to meet current compliance requirements. Christena Deem, MD 11/18/2018 8:31:46 AM This report has been signed electronically. Number of Addenda: 0 Note Initiated On: 11/18/2018 7:27 AM Scope Withdrawal Time: 0 hours 6 minutes 29 seconds  Total Procedure Duration: 0 hours 13 minutes 11 seconds       [facility]

## 2018-11-18 NOTE — Anesthesia Postprocedure Evaluation (Signed)
Anesthesia Post Note  Patient: Susan Gregory  Procedure(s) Performed: ESOPHAGOGASTRODUODENOSCOPY (EGD) WITH PROPOFOL (N/A ) COLONOSCOPY WITH PROPOFOL (N/A )  Patient location during evaluation: Endoscopy Anesthesia Type: General Level of consciousness: awake and alert Pain management: pain level controlled Vital Signs Assessment: post-procedure vital signs reviewed and stable Respiratory status: spontaneous breathing, nonlabored ventilation, respiratory function stable and patient connected to nasal cannula oxygen Cardiovascular status: blood pressure returned to baseline and stable Postop Assessment: no apparent nausea or vomiting Anesthetic complications: no     Last Vitals:  Vitals:   11/18/18 0843 11/18/18 0853  BP: 129/90 130/80  Pulse: 73 76  Resp: 15 18  Temp:    SpO2: 100% 100%    Last Pain:  Vitals:   11/18/18 0853  TempSrc:   PainSc: 0-No pain                 Martha Clan

## 2018-11-18 NOTE — Op Note (Signed)
[facility] Gastroenterology Patient Name: Susan Gregory Procedure Date: 11/18/2018 7:27 AM MRN: [id number] Account #: [id number] Date of Birth: 27-Mar-1960 Admit Type: Outpatient Age: 58 Room: [facility] ENDO ROOM 3 Gender: Female Note Status: Finalized Procedure:            Upper GI endoscopy Indications:          Follow-up of Barrett's esophagus Providers:            Lollie Sails, MD Medicines:            Monitored Anesthesia Care Complications:        No immediate complications. Procedure:            Pre-Anesthesia Assessment:                       - ASA Grade Assessment: II - A patient with mild                        systemic disease.                       After obtaining informed consent, the endoscope was                        passed under direct vision. Throughout the procedure,                        the patient's blood pressure, pulse, and oxygen                        saturations were monitored continuously. The Endoscope                        was introduced through the mouth, and advanced to the                        third part of duodenum. The patient tolerated the                        procedure well. Findings:      There were esophageal mucosal changes consistent with short-segment       Barrett's esophagus present in the lower third of the esophagus. The       maximum longitudinal extent of these mucosal changes was 1 cm in length.       Mucosa was biopsied with a cold forceps for histology in 4 quadrants.       One specimen bottle was sent to pathology.      The exam of the esophagus was otherwise normal.      Diffuse and patchy mild inflammation characterized by congestion (edema)       and erythema was found in the gastric body and in the gastric antrum.       Biopsies were taken with a cold forceps for histology.      The cardia and gastric fundus were normal on retroflexion otherwise.      The examined duodenum was normal. Impression:            - Esophageal mucosal changes consistent with                        short-segment Barrett's esophagus. Biopsied.                       -  Chronic gastritis. Biopsied.                       - Normal examined duodenum. Recommendation:       - Await pathology results.                       - Perform a colonoscopy today. Procedure Code(s):    --- Professional ---                       [phone], Esophagogastroduodenoscopy, flexible, transoral;                        with biopsy, single or multiple Diagnosis Code(s):    --- Professional ---                       K22.70, Barrett's esophagus without dysplasia                       K29.50, Unspecified chronic gastritis without bleeding CPT copyright 2019 American Medical Association. All rights reserved. The codes documented in this report are preliminary and upon coder review may  be revised to meet current compliance requirements. Christena Deem, MD 11/18/2018 8:14:03 AM This report has been signed electronically. Number of Addenda: 0 Note Initiated On: 11/18/2018 7:27 AM      [facility]

## 2018-11-18 NOTE — Anesthesia Post-op Follow-up Note (Signed)
Anesthesia QCDR form completed.        

## 2018-11-18 NOTE — Transfer of Care (Signed)
Immediate Anesthesia Transfer of Care Note  Patient: Susan Gregory  Procedure(s) Performed: ESOPHAGOGASTRODUODENOSCOPY (EGD) WITH PROPOFOL (N/A ) COLONOSCOPY WITH PROPOFOL (N/A )  Patient Location: PACU  Anesthesia Type:General  Level of Consciousness: sedated  Airway & Oxygen Therapy: Patient Spontanous Breathing and Patient connected to nasal cannula oxygen  Post-op Assessment: Report given to RN and Post -op Vital signs reviewed and stable  Post vital signs: Reviewed and stable  Last Vitals:  Vitals Value Taken Time  BP 127/86 11/18/18 0833  Temp 36.1 C 11/18/18 0833  Pulse 74 11/18/18 0834  Resp 28 11/18/18 0834  SpO2 100 % 11/18/18 0834  Vitals shown include unvalidated device data.  Last Pain:  Vitals:   11/18/18 0833  TempSrc: Tympanic  PainSc:          Complications: No apparent anesthesia complications

## 2018-11-18 NOTE — H&P (Signed)
Outpatient short stay form Pre-procedure 11/18/2018 7:47 AM Lollie Sails MD  Primary Physician: Dr. Otilio Miu  Reason for visit: EGD and colonoscopy  History of present illness: Patient is a 58 year old female presenting today for an EGD and colonoscopy in regards her personal history of Barrett's esophagus and family history of colon cancer and polyps in primary relatives.  Tolerating her prep well.  She does take a daily PPI.  She takes no aspirin or blood thinning agent with the exception of a daily 81 mg aspirin that was held today.  He has no upper GI symptoms and denies any abdominal pain rectal bleeding or diarrhea.    Current Facility-Administered Medications:  .  0.9 %  sodium chloride infusion, , Intravenous, Continuous, Lollie Sails, MD, Last Rate: 20 mL/hr at 11/18/18 3154  Medications Prior to Admission  Medication Sig Dispense Refill Last Dose  . albuterol (VENTOLIN HFA) 108 (90 Base) MCG/ACT inhaler INHALE 2 PUFFS EVERY 6 HOURS AS NEEDED WHEEZING 6.7 g 1 Past Month at Unknown time  . amLODipine (NORVASC) 5 MG tablet Take 1 tablet (5 mg total) by mouth daily. 90 tablet 1 11/17/2018 at 0600  . aspirin 81 MG tablet Take 1 tablet by mouth daily.   11/17/2018 at 0600  . celecoxib (CELEBREX) 200 MG capsule TAKE 1 CAPSULE BY MOUTH EVERY DAY 30 capsule 2 Past Month at Unknown time  . clobetasol (TEMOVATE) 0.05 % external solution Apply 1 application topically 2 (two) times daily. 50 mL 3 11/17/2018 at 0800  . hydrochlorothiazide (HYDRODIURIL) 50 MG tablet Take 1 tablet (50 mg total) by mouth daily. 90 tablet 1 11/17/2018 at 0800  . losartan (COZAAR) 25 MG tablet Take 2 tablets (50 mg total) by mouth daily. 189 tablet 1 11/17/2018 at 0600  . pantoprazole (PROTONIX) 40 MG tablet Take 2 tablets (80 mg total) by mouth daily. Dr Gustavo Lah 180 tablet 1 11/17/2018 at 0600  . simvastatin (ZOCOR) 20 MG tablet Take 1 tablet (20 mg total) by mouth daily. 90 tablet 1 11/17/2018 at 0600  .  griseofulvin (GRIFULVIN V) 500 MG tablet Take 500 mg by mouth 2 (two) times daily. Dr Phillip Heal  5      No Known Allergies   Past Medical History:  Diagnosis Date  . Allergy   . Barrett's esophagus   . GERD (gastroesophageal reflux disease)   . Hyperlipidemia   . Hypertension     Review of systems:      Physical Exam    Heart and lungs: Regular rate and rhythm without rub or gallop lungs are bilaterally clear    HEENT: Normocephalic atraumatic eyes are anicteric    Other:    Pertinant exam for procedure: Soft nontender nondistended bowel sounds positive normoactive.    Planned proceedures: EGD, colonoscopy and indicated procedures. I have discussed the risks benefits and complications of procedures to include not limited to bleeding, infection, perforation and the risk of sedation and the patient wishes to proceed.    Lollie Sails, MD Gastroenterology 11/18/2018  7:47 AM

## 2018-11-21 ENCOUNTER — Encounter: Payer: Self-pay | Admitting: Gastroenterology

## 2018-11-21 LAB — SURGICAL PATHOLOGY

## 2018-12-03 ENCOUNTER — Other Ambulatory Visit: Payer: Self-pay | Admitting: Family Medicine

## 2018-12-03 DIAGNOSIS — L409 Psoriasis, unspecified: Secondary | ICD-10-CM

## 2018-12-06 ENCOUNTER — Ambulatory Visit (INDEPENDENT_AMBULATORY_CARE_PROVIDER_SITE_OTHER): Payer: BC Managed Care – PPO

## 2018-12-06 DIAGNOSIS — Z23 Encounter for immunization: Secondary | ICD-10-CM | POA: Diagnosis not present

## 2018-12-15 DIAGNOSIS — K2270 Barrett's esophagus without dysplasia: Secondary | ICD-10-CM | POA: Diagnosis not present

## 2018-12-20 DIAGNOSIS — K2270 Barrett's esophagus without dysplasia: Secondary | ICD-10-CM | POA: Diagnosis not present

## 2018-12-20 DIAGNOSIS — K2970 Gastritis, unspecified, without bleeding: Secondary | ICD-10-CM | POA: Diagnosis not present

## 2018-12-20 DIAGNOSIS — R748 Abnormal levels of other serum enzymes: Secondary | ICD-10-CM | POA: Diagnosis not present

## 2018-12-20 DIAGNOSIS — B9681 Helicobacter pylori [H. pylori] as the cause of diseases classified elsewhere: Secondary | ICD-10-CM | POA: Diagnosis not present

## 2019-01-18 DIAGNOSIS — R799 Abnormal finding of blood chemistry, unspecified: Secondary | ICD-10-CM | POA: Diagnosis not present

## 2019-01-18 DIAGNOSIS — R918 Other nonspecific abnormal finding of lung field: Secondary | ICD-10-CM | POA: Diagnosis not present

## 2019-02-15 DIAGNOSIS — K2970 Gastritis, unspecified, without bleeding: Secondary | ICD-10-CM | POA: Diagnosis not present

## 2019-02-15 DIAGNOSIS — B9681 Helicobacter pylori [H. pylori] as the cause of diseases classified elsewhere: Secondary | ICD-10-CM | POA: Diagnosis not present

## 2019-02-21 DIAGNOSIS — B350 Tinea barbae and tinea capitis: Secondary | ICD-10-CM | POA: Diagnosis not present

## 2019-02-21 DIAGNOSIS — L817 Pigmented purpuric dermatosis: Secondary | ICD-10-CM | POA: Diagnosis not present

## 2019-03-08 DIAGNOSIS — M3500 Sicca syndrome, unspecified: Secondary | ICD-10-CM | POA: Diagnosis not present

## 2019-03-12 ENCOUNTER — Other Ambulatory Visit: Payer: Self-pay | Admitting: Family Medicine

## 2019-03-12 DIAGNOSIS — L409 Psoriasis, unspecified: Secondary | ICD-10-CM

## 2019-03-15 ENCOUNTER — Other Ambulatory Visit: Payer: Self-pay | Admitting: Family Medicine

## 2019-03-15 DIAGNOSIS — I10 Essential (primary) hypertension: Secondary | ICD-10-CM

## 2019-03-29 ENCOUNTER — Other Ambulatory Visit: Payer: Self-pay

## 2019-03-29 ENCOUNTER — Ambulatory Visit (INDEPENDENT_AMBULATORY_CARE_PROVIDER_SITE_OTHER): Payer: BC Managed Care – PPO | Admitting: Family Medicine

## 2019-03-29 ENCOUNTER — Encounter: Payer: Self-pay | Admitting: Family Medicine

## 2019-03-29 DIAGNOSIS — M79662 Pain in left lower leg: Secondary | ICD-10-CM | POA: Diagnosis not present

## 2019-03-29 DIAGNOSIS — M1712 Unilateral primary osteoarthritis, left knee: Secondary | ICD-10-CM

## 2019-03-29 DIAGNOSIS — J4521 Mild intermittent asthma with (acute) exacerbation: Secondary | ICD-10-CM

## 2019-03-29 DIAGNOSIS — I10 Essential (primary) hypertension: Secondary | ICD-10-CM | POA: Diagnosis not present

## 2019-03-29 DIAGNOSIS — E7849 Other hyperlipidemia: Secondary | ICD-10-CM

## 2019-03-29 DIAGNOSIS — K219 Gastro-esophageal reflux disease without esophagitis: Secondary | ICD-10-CM

## 2019-03-29 MED ORDER — LOSARTAN POTASSIUM 25 MG PO TABS: 50.0000 mg | ORAL_TABLET | Freq: Every day | ORAL | 1 refills | Status: DC

## 2019-03-29 MED ORDER — ALBUTEROL SULFATE HFA 108 (90 BASE) MCG/ACT IN AERS: INHALATION_SPRAY | RESPIRATORY_TRACT | 1 refills | Status: DC

## 2019-03-29 MED ORDER — HYDROCHLOROTHIAZIDE 50 MG PO TABS: 50.0000 mg | ORAL_TABLET | Freq: Every day | ORAL | 1 refills | Status: DC

## 2019-03-29 MED ORDER — CELECOXIB 200 MG PO CAPS: ORAL_CAPSULE | ORAL | 5 refills | Status: DC

## 2019-03-29 MED ORDER — AMLODIPINE BESYLATE 5 MG PO TABS: 5.0000 mg | ORAL_TABLET | Freq: Every day | ORAL | 1 refills | Status: DC

## 2019-03-29 MED ORDER — SIMVASTATIN 20 MG PO TABS: 20.0000 mg | ORAL_TABLET | Freq: Every day | ORAL | 1 refills | Status: DC

## 2019-03-29 NOTE — Patient Instructions (Signed)
Mediterranean Diet A Mediterranean diet refers to food and lifestyle choices that are based on the traditions of countries located on the [employer]. This way of eating has been shown to help prevent certain conditions and improve outcomes for people who have chronic diseases, like kidney disease and heart disease. What are tips for following this plan? Lifestyle  Cook and eat meals together with your family, when possible.  Drink enough fluid to keep your urine clear or pale yellow.  Be physically active every day. This includes: ? Aerobic exercise like running or swimming. ? Leisure activities like gardening, walking, or housework.  Get 7-8 hours of sleep each night.  If recommended by your health care provider, drink red wine in moderation. This means 1 glass a day for nonpregnant women and 2 glasses a day for men. A glass of wine equals 5 oz (150 mL). Reading food labels   Check the serving size of packaged foods. For foods such as rice and pasta, the serving size refers to the amount of cooked product, not dry.  Check the total fat in packaged foods. Avoid foods that have saturated fat or trans fats.  Check the ingredients list for added sugars, such as corn syrup. Shopping  At the grocery store, buy most of your food from the areas near the walls of the store. This includes: ? Fresh fruits and vegetables (produce). ? Grains, beans, nuts, and seeds. Some of these may be available in unpackaged forms or large amounts (in bulk). ? Fresh seafood. ? Poultry and eggs. ? Low-fat dairy products.  Buy whole ingredients instead of prepackaged foods.  Buy fresh fruits and vegetables in-season from local farmers markets.  Buy frozen fruits and vegetables in resealable bags.  If you do not have access to quality fresh seafood, buy precooked frozen shrimp or canned fish, such as tuna, salmon, or sardines.  Buy small amounts of raw or cooked vegetables, salads, or olives from  the deli or salad bar at your store.  Stock your pantry so you always have certain foods on hand, such as olive oil, canned tuna, canned tomatoes, rice, pasta, and beans. Cooking  Cook foods with extra-virgin olive oil instead of using butter or other vegetable oils.  Have meat as a side dish, and have vegetables or grains as your main dish. This means having meat in small portions or adding small amounts of meat to foods like pasta or stew.  Use beans or vegetables instead of meat in common dishes like chili or lasagna.  Experiment with different cooking methods. Try roasting or broiling vegetables instead of steaming or sauteing them.  Add frozen vegetables to soups, stews, pasta, or rice.  Add nuts or seeds for added healthy fat at each meal. You can add these to yogurt, salads, or vegetable dishes.  Marinate fish or vegetables using olive oil, lemon juice, garlic, and fresh herbs. Meal planning   Plan to eat 1 vegetarian meal one day each week. Try to work up to 2 vegetarian meals, if possible.  Eat seafood 2 or more times a week.  Have healthy snacks readily available, such as: ? Vegetable sticks with hummus. ? [place] yogurt. ? Fruit and nut trail mix.  Eat balanced meals throughout the week. This includes: ? Fruit: 2-3 servings a day ? Vegetables: 4-5 servings a day ? Low-fat dairy: 2 servings a day ? Fish, poultry, or lean meat: 1 serving a day ? Beans and legumes: 2 or more servings a week ?  Nuts and seeds: 1-2 servings a day ? Whole grains: 6-8 servings a day ? Extra-virgin olive oil: 3-4 servings a day  Limit red meat and sweets to only a few servings a month   What are my food choices?  Mediterranean diet ? Recommended  Grains: Whole-grain pasta. Brown rice. Bulgar wheat. Polenta. Couscous. Whole-wheat bread. Oatmeal. Quinoa.  Vegetables: Artichokes. Beets. Broccoli. Cabbage. Carrots. Eggplant. Green beans. Chard. Kale. Spinach. Onions. Leeks. Peas. Squash.  Tomatoes. Peppers. Radishes.  Fruits: Apples. Apricots. Avocado. Berries. Bananas. Cherries. Dates. Figs. Grapes. Lemons. Melon. Oranges. Peaches. Plums. Pomegranate.  Meats and other protein foods: Beans. Almonds. Sunflower seeds. Pine nuts. Peanuts. Cod. Salmon. Scallops. Shrimp. Tuna. Tilapia. Clams. Oysters. Eggs.  Dairy: Low-fat milk. Cheese. Greek yogurt.  Beverages: Water. Red wine. Herbal tea.  Fats and oils: Extra virgin olive oil. Avocado oil. Grape seed oil.  Sweets and desserts: Greek yogurt with honey. Baked apples. Poached pears. Trail mix.  Seasoning and other foods: Basil. Cilantro. Coriander. Cumin. Mint. Parsley. Sage. Rosemary. Tarragon. Garlic. Oregano. Thyme. Pepper. Balsalmic vinegar. Tahini. Hummus. Tomato sauce. Olives. Mushrooms. ? Limit these  Grains: Prepackaged pasta or rice dishes. Prepackaged cereal with added sugar.  Vegetables: Deep fried potatoes (french fries).  Fruits: Fruit canned in syrup.  Meats and other protein foods: Beef. Pork. Lamb. Poultry with skin. Hot dogs. Bacon.  Dairy: Ice cream. Sour cream. Whole milk.  Beverages: Juice. Sugar-sweetened soft drinks. Beer. Liquor and spirits.  Fats and oils: Butter. Canola oil. Vegetable oil. Beef fat (tallow). Lard.  Sweets and desserts: Cookies. Cakes. Pies. Candy.  Seasoning and other foods: Mayonnaise. Premade sauces and marinades. The items listed may not be a complete list. Talk with your dietitian about what dietary choices are right for you. Summary  The Mediterranean diet includes both food and lifestyle choices.  Eat a variety of fresh fruits and vegetables, beans, nuts, seeds, and whole grains.  Limit the amount of red meat and sweets that you eat.  Talk with your health care provider about whether it is safe for you to drink red wine in moderation. This means 1 glass a day for nonpregnant women and 2 glasses a day for men. A glass of wine equals 5 oz (150 mL). This information  is not intended to replace advice given to you by your health care provider. Make sure you discuss any questions you have with your health care provider. Document Revised: 09/26/2015 Document Reviewed: 09/19/2015 Elsevier Patient Education  2020 Elsevier Inc.      Why follow it? Research shows. . Those who follow the Mediterranean diet have a reduced risk of heart disease  . The diet is associated with a reduced incidence of Parkinson's and Alzheimer's diseases . People following the diet may have longer life expectancies and lower rates of chronic diseases  . The Dietary Guidelines for Americans recommends the Mediterranean diet as an eating plan to promote health and prevent disease  What Is the Mediterranean Diet?  . Healthy eating plan based on typical foods and recipes of Mediterranean-style cooking . The diet is primarily a plant based diet; these foods should make up a majority of meals   Starches - Plant based foods should make up a majority of meals - They are an important sources of vitamins, minerals, energy, antioxidants, and fiber - Choose whole grains, foods high in fiber and minimally processed items  - Typical grain sources include wheat, oats, barley, corn, brown rice, bulgar, farro, millet, polenta, couscous  - Various   types of beans include chickpeas, lentils, fava beans, black beans, white beans   Fruits  Veggies - Large quantities of antioxidant rich fruits & veggies; 6 or more servings  - Vegetables can be eaten raw or lightly drizzled with oil and cooked  - Vegetables common to the traditional Mediterranean Diet include: artichokes, arugula, beets, broccoli, brussel sprouts, cabbage, carrots, celery, collard greens, cucumbers, eggplant, kale, leeks, lemons, lettuce, mushrooms, okra, onions, peas, peppers, potatoes, pumpkin, radishes, rutabaga, shallots, spinach, sweet potatoes, turnips, zucchini - Fruits common to the Mediterranean Diet include: apples, apricots,  avocados, cherries, clementines, dates, figs, grapefruits, grapes, melons, nectarines, oranges, peaches, pears, pomegranates, strawberries, tangerines  Fats - Replace butter and margarine with healthy oils, such as olive oil, canola oil, and tahini  - Limit nuts to no more than a handful a day  - Nuts include walnuts, almonds, pecans, pistachios, pine nuts  - Limit or avoid candied, honey roasted or heavily salted nuts - Olives are central to the Mediterranean diet - can be eaten whole or used in a variety of dishes   Meats Protein - Limiting red meat: no more than a few times a month - When eating red meat: choose lean cuts and keep the portion to the size of deck of cards - Eggs: approx. 0 to 4 times a week  - Fish and lean poultry: at least 2 a week  - Healthy protein sources include, chicken, turkey, lean beef, lamb - Increase intake of seafood such as tuna, salmon, trout, mackerel, shrimp, scallops - Avoid or limit high fat processed meats such as sausage and bacon  Dairy - Include moderate amounts of low fat dairy products  - Focus on healthy dairy such as fat free yogurt, skim milk, low or reduced fat cheese - Limit dairy products higher in fat such as whole or 2% milk, cheese, ice cream  Alcohol - Moderate amounts of red wine is ok  - No more than 5 oz daily for women (all ages) and men older than age 65  - No more than 10 oz of wine daily for men younger than 65  Other - Limit sweets and other desserts  - Use herbs and spices instead of salt to flavor foods  - Herbs and spices common to the traditional Mediterranean Diet include: basil, bay leaves, chives, cloves, cumin, fennel, garlic, lavender, marjoram, mint, oregano, parsley, pepper, rosemary, sage, savory, sumac, tarragon, thyme   It's not just a diet, it's a lifestyle:  . The Mediterranean diet includes lifestyle factors typical of those in the region  . Foods, drinks and meals are best eaten with others and savored . Daily  physical activity is important for overall good health . This could be strenuous exercise like running and aerobics . This could also be more leisurely activities such as walking, housework, yard-work, or taking the stairs . Moderation is the key; a balanced and healthy diet accommodates most foods and drinks . Consider portion sizes and frequency of consumption of certain foods   Meal Ideas & Options:  . Breakfast:  o Whole wheat toast or whole wheat English muffins with peanut butter & hard boiled egg o Steel cut oats topped with apples & cinnamon and skim milk  o Fresh fruit: banana, strawberries, melon, berries, peaches  o Smoothies: strawberries, bananas, greek yogurt, peanut butter o Low fat greek yogurt with blueberries and granola  o Egg white omelet with spinach and mushrooms o Breakfast couscous: whole wheat couscous, apricots, skim milk,   cranberries  . Sandwiches:  o Hummus and grilled vegetables (peppers, zucchini, squash) on whole wheat bread   o Grilled chicken on whole wheat pita with lettuce, tomatoes, cucumbers or tzatziki  o Tuna salad on whole wheat bread: tuna salad made with greek yogurt, olives, red peppers, capers, green onions o Garlic rosemary lamb pita: lamb sauted with garlic, rosemary, salt & pepper; add lettuce, cucumber, greek yogurt to pita - flavor with lemon juice and black pepper  . Seafood:  o Mediterranean grilled salmon, seasoned with garlic, basil, parsley, lemon juice and black pepper o Shrimp, lemon, and spinach whole-grain pasta salad made with low fat greek yogurt  o Seared scallops with lemon orzo  o Seared tuna steaks seasoned salt, pepper, coriander topped with tomato mixture of olives, tomatoes, olive oil, minced garlic, parsley, green onions and cappers  . Meats:  o Herbed greek chicken salad with kalamata olives, cucumber, feta  o Red bell peppers stuffed with spinach, bulgur, lean ground beef (or lentils) & topped with feta   o Kebabs:  skewers of chicken, tomatoes, onions, zucchini, squash  o Turkey burgers: made with red onions, mint, dill, lemon juice, feta cheese topped with roasted red peppers . Vegetarian o Cucumber salad: cucumbers, artichoke hearts, celery, red onion, feta cheese, tossed in olive oil & lemon juice  o Hummus and whole grain pita points with a greek salad (lettuce, tomato, feta, olives, cucumbers, red onion) o Lentil soup with celery, carrots made with vegetable broth, garlic, salt and pepper  o Tabouli salad: parsley, bulgur, mint, scallions, cucumbers, tomato, radishes, lemon juice, olive oil, salt and pepper.      

## 2019-03-29 NOTE — Progress Notes (Signed)
Date:  03/29/2019   Name:  Susan Gregory   DOB:  06/07/1960   MRN:  [id number]   Chief Complaint: Hyperlipidemia, Hypertension, Arthritis, and Allergic Rhinitis   Hyperlipidemia This is a chronic problem. The current episode started more than 1 year ago. The problem is controlled. Recent lipid tests were reviewed and are normal. She has no history of chronic renal disease, diabetes, hypothyroidism, liver disease, obesity or nephrotic syndrome. There are no known factors aggravating her hyperlipidemia. Pertinent negatives include no chest pain, focal sensory loss, focal weakness, leg pain, myalgias or shortness of breath. The current treatment provides mild improvement of lipids. There are no compliance problems.  Risk factors for coronary artery disease include dyslipidemia and hypertension.  Hypertension This is a chronic problem. The current episode started more than 1 year ago. The problem has been gradually improving since onset. The problem is controlled. Pertinent negatives include no anxiety, blurred vision, chest pain, headaches, malaise/fatigue, neck pain, orthopnea, palpitations, peripheral edema, PND, shortness of breath or sweats. Risk factors for coronary artery disease include dyslipidemia and obesity. Past treatments include angiotensin blockers, calcium channel blockers and diuretics. The current treatment provides moderate improvement. There are no compliance problems.  There is no history of angina, kidney disease, CAD/MI, CVA, heart failure, left ventricular hypertrophy, PVD or retinopathy. There is no history of chronic renal disease, a hypertension causing med or renovascular disease.  Arthritis Presents for follow-up visit. The symptoms have been improving. Affected locations include the left hip. Pertinent negatives include no diarrhea, dysuria, fatigue, fever or rash.  Gastroesophageal Reflux She reports no abdominal pain, no belching, no chest pain, no coughing, no  heartburn, no nausea, no sore throat, no stridor or no wheezing. The current episode started more than 1 year ago. The problem has been gradually improving. Nothing aggravates the symptoms. Pertinent negatives include no fatigue.    Lab Results  Component Value Date   CREATININE 0.92 09/26/2018   BUN 13 09/26/2018   NA 142 09/26/2018   K 3.8 09/26/2018   CL 101 09/26/2018   CO2 25 09/26/2018   Lab Results  Component Value Date   CHOL 180 09/26/2018   HDL 54 09/26/2018   LDLCALC 103 (H) 09/26/2018   TRIG 116 09/26/2018   CHOLHDL 2.9 05/03/2017   No results found for: TSH No results found for: HGBA1C   Review of Systems  Constitutional: Negative.  Negative for chills, fatigue, fever, malaise/fatigue and unexpected weight change.  HENT: Negative for congestion, ear discharge, ear pain, rhinorrhea, sinus pressure, sneezing and sore throat.   Eyes: Negative for blurred vision, photophobia, pain, discharge, redness and itching.  Respiratory: Negative for cough, shortness of breath, wheezing and stridor.   Cardiovascular: Negative for chest pain, palpitations, orthopnea and PND.  Gastrointestinal: Negative for abdominal pain, blood in stool, constipation, diarrhea, heartburn, nausea and vomiting.  Endocrine: Negative for cold intolerance, heat intolerance, polydipsia, polyphagia and polyuria.  Genitourinary: Negative for dysuria, flank pain, frequency, hematuria, menstrual problem, pelvic pain, urgency, vaginal bleeding and vaginal discharge.  Musculoskeletal: Positive for arthritis. Negative for arthralgias, back pain, myalgias and neck pain.  Skin: Negative for rash.  Allergic/Immunologic: Negative for environmental allergies and food allergies.  Neurological: Negative for dizziness, focal weakness, weakness, light-headedness, numbness and headaches.  Hematological: Negative for adenopathy. Does not bruise/bleed easily.  Psychiatric/Behavioral: Negative for dysphoric mood. The  patient is not nervous/anxious.     Patient Active Problem List   Diagnosis Date Noted  . Familial  multiple lipoprotein-type hyperlipidemia 07/18/2014  . Disorder of lipoprotein and lipid metabolism 07/18/2014  . Benign hypertension 07/18/2014  . Acid reflux 07/18/2014  . Encounter for general adult medical examination without abnormal findings 07/18/2014  . Neck sprain and strain 07/18/2014  . Abnormal breast finding 07/18/2014  . Abnormal finding on breast imaging 07/18/2014  . Arthritis of knee, degenerative 07/18/2014  . TB skin/subcutaneous 07/18/2014  . Fecal occult blood test positive 11/01/2013    No Known Allergies  Past Surgical History:  Procedure Laterality Date  . BREAST BIOPSY Right 09/04/2016   US guided right breast biopsy. Coil marker neg  . BREAST BIOPSY Right 09/07/2016   stereo biopsy. Ribbon marker neg  . COLONOSCOPY    . COLONOSCOPY WITH PROPOFOL N/A 11/18/2018   Procedure: COLONOSCOPY WITH PROPOFOL;  Surgeon: Lollie Sails, MD;  Location: [facility] ENDOSCOPY;  Service: Endoscopy;  Laterality: N/A;  . ESOPHAGOGASTRODUODENOSCOPY (EGD) WITH PROPOFOL N/A 04/20/2016   Procedure: ESOPHAGOGASTRODUODENOSCOPY (EGD) WITH PROPOFOL;  Surgeon: Lollie Sails, MD;  Location: [facility] ENDOSCOPY;  Service: Endoscopy;  Laterality: N/A;  . ESOPHAGOGASTRODUODENOSCOPY (EGD) WITH PROPOFOL N/A 11/18/2018   Procedure: ESOPHAGOGASTRODUODENOSCOPY (EGD) WITH PROPOFOL;  Surgeon: Lollie Sails, MD;  Location: [facility] ENDOSCOPY;  Service: Endoscopy;  Laterality: N/A;  . THROAT SURGERY     nodules taken off throat  . TUBAL LIGATION      Social History   Tobacco Use  . Smoking status: Never Smoker  . Smokeless tobacco: Never Used  Substance Use Topics  . Alcohol use: No    Alcohol/week: 0.0 standard drinks  . Drug use: No     Medication list has been reviewed and updated.  Current Meds  Medication Sig  . albuterol (VENTOLIN HFA) 108 (90 Base) MCG/ACT inhaler INHALE 2 PUFFS  EVERY 6 HOURS AS NEEDED WHEEZING  . amLODipine (NORVASC) 5 MG tablet TAKE 1 TABLET BY MOUTH EVERY DAY  . aspirin 81 MG tablet Take 1 tablet by mouth daily.  . celecoxib (CELEBREX) 200 MG capsule TAKE 1 CAPSULE BY MOUTH EVERY DAY  . clobetasol (TEMOVATE) 0.05 % external solution APPLY TO AFFECTED AREA TWICE A DAY  . griseofulvin (GRIFULVIN V) 500 MG tablet Take 500 mg by mouth 2 (two) times daily. Dr Phillip Heal  . hydrochlorothiazide (HYDRODIURIL) 50 MG tablet Take 1 tablet (50 mg total) by mouth daily.  Marland Kitchen ketoconazole (NIZORAL) 2 % shampoo graham  . losartan (COZAAR) 25 MG tablet Take 2 tablets (50 mg total) by mouth daily.  . pantoprazole (PROTONIX) 40 MG tablet Take 2 tablets (80 mg total) by mouth daily. Dr Gustavo Lah  . simvastatin (ZOCOR) 20 MG tablet Take 1 tablet (20 mg total) by mouth daily.  . tranexamic acid (LYSTEDA) 650 MG TABS tablet Take 325 mg by mouth 2 (two) times daily. graham    PHQ 2/9 Scores 03/29/2019 12/28/2016 07/19/2014  PHQ - 2 Score 0 0 0  PHQ- 9 Score 1 0 -    BP Readings from Last 3 Encounters:  03/29/19 130/82  11/18/18 130/80  09/26/18 130/64    Physical Exam Vitals and nursing note reviewed.  Constitutional:      General: She is not in acute distress.    Appearance: She is not diaphoretic.  HENT:     Head: Normocephalic and atraumatic.     Right Ear: Tympanic membrane, ear canal and external ear normal.     Left Ear: Tympanic membrane, ear canal and external ear normal.     Nose: Nose normal. No congestion  or rhinorrhea.     Mouth/Throat:     Mouth: Mucous membranes are moist.  Eyes:     General:        Right eye: No discharge.        Left eye: No discharge.     Conjunctiva/sclera: Conjunctivae normal.     Pupils: Pupils are equal, round, and reactive to light.  Neck:     Thyroid: No thyromegaly.     Vascular: No JVD.  Cardiovascular:     Rate and Rhythm: Normal rate and regular rhythm.     Heart sounds: Normal heart sounds. No murmur. No  friction rub. No gallop.   Pulmonary:     Effort: Pulmonary effort is normal. No respiratory distress.     Breath sounds: Normal breath sounds. No stridor. No wheezing, rhonchi or rales.  Chest:     Chest wall: No tenderness.  Abdominal:     General: Bowel sounds are normal.     Palpations: Abdomen is soft. There is no mass.     Tenderness: There is no abdominal tenderness. There is no guarding.  Musculoskeletal:        General: Normal range of motion.     Cervical back: Normal range of motion and neck supple.  Lymphadenopathy:     Cervical: No cervical adenopathy.  Skin:    General: Skin is warm and dry.     Capillary Refill: Capillary refill takes less than 2 seconds.     Findings: No bruising or erythema.  Neurological:     Mental Status: She is alert.     Deep Tendon Reflexes: Reflexes are normal and symmetric.     Wt Readings from Last 3 Encounters:  03/29/19 221 lb (100.2 kg)  11/18/18 200 lb (90.7 kg)  09/26/18 215 lb (97.5 kg)    BP 130/82   Pulse 72   Ht 5\' 4"  (1.626 m)   Wt 221 lb (100.2 kg)   LMP  (LMP Unknown) Comment: menopausal, denies preg  BMI 37.93 kg/m   Assessment and Plan:  1. Reactive airway disease, mild intermittent, with acute exacerbation   Intermittent.  Stable.  Mild.  Continue albuterol inhaler 1 to 2 puffs every 6 hours as needed wheezing. - albuterol (VENTOLIN HFA) 108 (90 Base) MCG/ACT inhaler; INHALE 2 PUFFS EVERY 6 HOURS AS NEEDED WHEEZING  Dispense: 6.7 g; Refill: 1  2. Benign hypertension Chronic.  Controlled.  Stable.  Continue amlodipine 5 mg once a day.,  Hydrochlorothiazide 50 mg once a day.,  And losartan 25 mg 2 tablets by mouth daily. - amLODipine (NORVASC) 5 MG tablet; Take 1 tablet (5 mg total) by mouth daily.  Dispense: 90 tablet; Refill: 1 - hydrochlorothiazide (HYDRODIURIL) 50 MG tablet; Take 1 tablet (50 mg total) by mouth daily.  Dispense: 90 tablet; Refill: 1 - losartan (COZAAR) 25 MG tablet; Take 2 tablets (50 mg  total) by mouth daily.  Dispense: 180 tablet; Refill: 1  3. Primary osteoarthritis of left knee Chronic.  Controlled.  Episodic.  Stable.  Continue Celebrex 200 mg once a day. - celecoxib (CELEBREX) 200 MG capsule; TAKE 1 CAPSULE BY MOUTH EVERY DAY  Dispense: 30 capsule; Refill: 5  4. Pain of left lower leg As noted above - celecoxib (CELEBREX) 200 MG capsule; TAKE 1 CAPSULE BY MOUTH EVERY DAY  Dispense: 30 capsule; Refill: 5  5. Gastroesophageal reflux disease Chronic.  Controlled.  Stable.  Continue pantoprazole 40 mg once a day.  6. Familial multiple lipoprotein-type hyperlipidemia  Chronic.  Controlled.  Stable.  Continue simvastatin 20 mg once a day.  Previous labs were reviewed and these are acceptable at this time. - simvastatin (ZOCOR) 20 MG tablet; Take 1 tablet (20 mg total) by mouth daily.  Dispense: 90 tablet; Refill: 1

## 2019-04-04 DIAGNOSIS — Z01818 Encounter for other preprocedural examination: Secondary | ICD-10-CM | POA: Diagnosis not present

## 2019-04-06 DIAGNOSIS — M3500 Sicca syndrome, unspecified: Secondary | ICD-10-CM | POA: Diagnosis not present

## 2019-04-26 DIAGNOSIS — Z961 Presence of intraocular lens: Secondary | ICD-10-CM | POA: Diagnosis not present

## 2019-04-26 DIAGNOSIS — H35033 Hypertensive retinopathy, bilateral: Secondary | ICD-10-CM | POA: Diagnosis not present

## 2019-04-26 DIAGNOSIS — H04123 Dry eye syndrome of bilateral lacrimal glands: Secondary | ICD-10-CM | POA: Diagnosis not present

## 2019-05-23 DIAGNOSIS — R899 Unspecified abnormal finding in specimens from other organs, systems and tissues: Secondary | ICD-10-CM | POA: Diagnosis not present

## 2019-05-26 ENCOUNTER — Other Ambulatory Visit: Payer: Self-pay | Admitting: Family Medicine

## 2019-05-26 DIAGNOSIS — Z1231 Encounter for screening mammogram for malignant neoplasm of breast: Secondary | ICD-10-CM

## 2019-06-21 DIAGNOSIS — L918 Other hypertrophic disorders of the skin: Secondary | ICD-10-CM | POA: Diagnosis not present

## 2019-06-21 DIAGNOSIS — B078 Other viral warts: Secondary | ICD-10-CM | POA: Diagnosis not present

## 2019-06-21 DIAGNOSIS — L817 Pigmented purpuric dermatosis: Secondary | ICD-10-CM | POA: Diagnosis not present

## 2019-06-21 DIAGNOSIS — L738 Other specified follicular disorders: Secondary | ICD-10-CM | POA: Diagnosis not present

## 2019-07-31 ENCOUNTER — Other Ambulatory Visit: Payer: Self-pay | Admitting: Family Medicine

## 2019-07-31 DIAGNOSIS — Z1231 Encounter for screening mammogram for malignant neoplasm of breast: Secondary | ICD-10-CM

## 2019-09-04 ENCOUNTER — Ambulatory Visit
Admission: RE | Admit: 2019-09-04 | Discharge: 2019-09-04 | Disposition: A | Discharge status: Home / Self Care | Payer: BC Managed Care – PPO | Source: Ambulatory Visit | Attending: Family Medicine | Admitting: Family Medicine

## 2019-09-04 ENCOUNTER — Other Ambulatory Visit: Payer: Self-pay

## 2019-09-04 DIAGNOSIS — Z1231 Encounter for screening mammogram for malignant neoplasm of breast: Secondary | ICD-10-CM | POA: Diagnosis not present

## 2019-09-12 DIAGNOSIS — M3501 Sicca syndrome with keratoconjunctivitis: Secondary | ICD-10-CM | POA: Diagnosis not present

## 2019-09-18 DIAGNOSIS — R590 Localized enlarged lymph nodes: Secondary | ICD-10-CM | POA: Diagnosis not present

## 2019-09-18 DIAGNOSIS — K2100 Gastro-esophageal reflux disease with esophagitis, without bleeding: Secondary | ICD-10-CM | POA: Diagnosis not present

## 2019-09-18 DIAGNOSIS — Z7982 Long term (current) use of aspirin: Secondary | ICD-10-CM | POA: Diagnosis not present

## 2019-09-18 DIAGNOSIS — J381 Polyp of vocal cord and larynx: Secondary | ICD-10-CM | POA: Diagnosis not present

## 2019-09-18 DIAGNOSIS — E7800 Pure hypercholesterolemia, unspecified: Secondary | ICD-10-CM | POA: Diagnosis not present

## 2019-09-18 DIAGNOSIS — J45909 Unspecified asthma, uncomplicated: Secondary | ICD-10-CM | POA: Diagnosis not present

## 2019-09-18 DIAGNOSIS — M3500 Sicca syndrome, unspecified: Secondary | ICD-10-CM | POA: Diagnosis not present

## 2019-09-18 DIAGNOSIS — Z6835 Body mass index (BMI) 35.0-35.9, adult: Secondary | ICD-10-CM | POA: Diagnosis not present

## 2019-09-18 DIAGNOSIS — J849 Interstitial pulmonary disease, unspecified: Secondary | ICD-10-CM | POA: Diagnosis not present

## 2019-09-18 DIAGNOSIS — I10 Essential (primary) hypertension: Secondary | ICD-10-CM | POA: Diagnosis not present

## 2019-09-18 DIAGNOSIS — Z79899 Other long term (current) drug therapy: Secondary | ICD-10-CM | POA: Diagnosis not present

## 2019-09-23 ENCOUNTER — Other Ambulatory Visit: Payer: Self-pay | Admitting: Family Medicine

## 2019-09-23 DIAGNOSIS — I10 Essential (primary) hypertension: Secondary | ICD-10-CM

## 2019-09-25 ENCOUNTER — Other Ambulatory Visit: Payer: Self-pay | Admitting: Family Medicine

## 2019-09-25 DIAGNOSIS — I10 Essential (primary) hypertension: Secondary | ICD-10-CM

## 2019-09-25 DIAGNOSIS — E7849 Other hyperlipidemia: Secondary | ICD-10-CM

## 2019-09-26 ENCOUNTER — Ambulatory Visit (INDEPENDENT_AMBULATORY_CARE_PROVIDER_SITE_OTHER): Payer: BC Managed Care – PPO | Admitting: Family Medicine

## 2019-09-26 ENCOUNTER — Other Ambulatory Visit
Admission: RE | Admit: 2019-09-26 | Discharge: 2019-09-26 | Disposition: A | Discharge status: Home / Self Care | Payer: BC Managed Care – PPO | Attending: Family Medicine | Admitting: Family Medicine

## 2019-09-26 ENCOUNTER — Other Ambulatory Visit: Payer: Self-pay

## 2019-09-26 ENCOUNTER — Encounter: Payer: Self-pay | Admitting: Family Medicine

## 2019-09-26 VITALS — BP 118/64 | HR 77 | Ht 64.0 in | Wt 208.0 lb

## 2019-09-26 DIAGNOSIS — K219 Gastro-esophageal reflux disease without esophagitis: Secondary | ICD-10-CM

## 2019-09-26 DIAGNOSIS — E7849 Other hyperlipidemia: Secondary | ICD-10-CM | POA: Insufficient documentation

## 2019-09-26 DIAGNOSIS — I10 Essential (primary) hypertension: Secondary | ICD-10-CM

## 2019-09-26 LAB — LIPID PANEL
Cholesterol: 179 mg/dL (ref 0–200)
HDL: 46 mg/dL (ref >40)
LDL Cholesterol: 119 mg/dL — ABNORMAL HIGH (ref 0–99)
Total CHOL/HDL Ratio: 3.9 RATIO
Triglycerides: 72 mg/dL (ref <150)
VLDL: 14 mg/dL (ref 0–40)

## 2019-09-26 MED ORDER — AMLODIPINE BESYLATE 10 MG PO TABS: 10.0000 mg | ORAL_TABLET | Freq: Every day | ORAL | 5 refills | Status: DC

## 2019-09-26 MED ORDER — SIMVASTATIN 20 MG PO TABS: 20.0000 mg | ORAL_TABLET | Freq: Every day | ORAL | 5 refills | Status: DC

## 2019-09-26 MED ORDER — HYDROCHLOROTHIAZIDE 50 MG PO TABS: 50.0000 mg | ORAL_TABLET | Freq: Every day | ORAL | 1 refills | Status: DC

## 2019-09-26 MED ORDER — LOSARTAN POTASSIUM 25 MG PO TABS: 50.0000 mg | ORAL_TABLET | Freq: Every day | ORAL | 5 refills | Status: DC

## 2019-09-26 MED ORDER — PANTOPRAZOLE SODIUM 40 MG PO TBEC: 80.0000 mg | DELAYED_RELEASE_TABLET | Freq: Every day | ORAL | 1 refills | Status: DC

## 2019-09-26 MED ORDER — AMLODIPINE BESYLATE 5 MG PO TABS: 5.0000 mg | ORAL_TABLET | Freq: Every day | ORAL | 1 refills | Status: DC

## 2019-09-26 NOTE — Progress Notes (Signed)
Date:  09/26/2019   Name:  Susan Gregory   DOB:  [birth year]   MRN:  [id number]   Chief Complaint: Hypertension (Medication refills/ follow up. ) and Gastroesophageal Reflux  Hypertension This is a chronic problem. The current episode started more than 1 year ago. The problem has been waxing and waning since onset. The problem is controlled. Pertinent negatives include no anxiety, blurred vision, chest pain, headaches, malaise/fatigue, neck pain, orthopnea, palpitations, peripheral edema, PND, shortness of breath or sweats. There are no associated agents to hypertension. There are no known risk factors for coronary artery disease.  Gastroesophageal Reflux She reports no chest pain.    Lab Results  Component Value Date   CREATININE 0.92 09/26/2018   BUN 13 09/26/2018   NA 142 09/26/2018   K 3.8 09/26/2018   CL 101 09/26/2018   CO2 25 09/26/2018   Lab Results  Component Value Date   CHOL 180 09/26/2018   HDL 54 09/26/2018   LDLCALC 103 (H) 09/26/2018   TRIG 116 09/26/2018   CHOLHDL 2.9 05/03/2017   No results found for: TSH No results found for: HGBA1C Lab Results  Component Value Date   WBC 3.5 (L) 08/15/2017   HGB 14.5 08/15/2017   HCT 42.2 08/15/2017   MCV 86.6 08/15/2017   PLT 181 08/15/2017   Lab Results  Component Value Date   ALT 22 09/26/2018   AST 18 09/26/2018   ALKPHOS 68 09/26/2018   BILITOT 0.5 09/26/2018     Review of Systems  Constitutional: Negative for malaise/fatigue.  Eyes: Negative for blurred vision.  Respiratory: Negative for shortness of breath.   Cardiovascular: Negative for chest pain, palpitations, orthopnea and PND.  Musculoskeletal: Negative for neck pain.  Neurological: Negative for headaches.    Patient Active Problem List   Diagnosis Date Noted  . Familial multiple lipoprotein-type hyperlipidemia 07/18/2014  . Disorder of lipoprotein and lipid metabolism 07/18/2014  . Benign hypertension 07/18/2014  . Acid reflux 07/18/2014   . Encounter for general adult medical examination without abnormal findings 07/18/2014  . Neck sprain and strain 07/18/2014  . Abnormal breast finding 07/18/2014  . Abnormal finding on breast imaging 07/18/2014  . Arthritis of knee, degenerative 07/18/2014  . TB skin/subcutaneous 07/18/2014  . Fecal occult blood test positive 11/01/2013    No Known Allergies  Past Surgical History:  Procedure Laterality Date  . BREAST BIOPSY Right 09/04/2016   US guided right breast biopsy. Coil marker neg  . BREAST BIOPSY Right 09/07/2016   stereo biopsy. Ribbon marker neg  . COLONOSCOPY    . COLONOSCOPY WITH PROPOFOL N/A 11/18/2018   Procedure: COLONOSCOPY WITH PROPOFOL;  Surgeon: Christena Deem, MD;  Location: [facility] ENDOSCOPY;  Service: Endoscopy;  Laterality: N/A;  . ESOPHAGOGASTRODUODENOSCOPY (EGD) WITH PROPOFOL N/A 04/20/2016   Procedure: ESOPHAGOGASTRODUODENOSCOPY (EGD) WITH PROPOFOL;  Surgeon: Christena Deem, MD;  Location: [facility] ENDOSCOPY;  Service: Endoscopy;  Laterality: N/A;  . ESOPHAGOGASTRODUODENOSCOPY (EGD) WITH PROPOFOL N/A 11/18/2018   Procedure: ESOPHAGOGASTRODUODENOSCOPY (EGD) WITH PROPOFOL;  Surgeon: Christena Deem, MD;  Location: [facility] ENDOSCOPY;  Service: Endoscopy;  Laterality: N/A;  . THROAT SURGERY     nodules taken off throat  . TUBAL LIGATION      Social History   Tobacco Use  . Smoking status: Never Smoker  . Smokeless tobacco: Never Used  Vaping Use  . Vaping Use: Never used  Substance Use Topics  . Alcohol use: No    Alcohol/week: 0.0 standard drinks  .  Drug use: No     Medication list has been reviewed and updated.  Current Meds  Medication Sig  . albuterol (VENTOLIN HFA) 108 (90 Base) MCG/ACT inhaler INHALE 2 PUFFS EVERY 6 HOURS AS NEEDED WHEEZING  . amLODipine (NORVASC) 5 MG tablet TAKE 1 TABLET BY MOUTH EVERY DAY  . aspirin 81 MG tablet Take 1 tablet by mouth daily.  . celecoxib (CELEBREX) 200 MG capsule TAKE 1 CAPSULE BY MOUTH EVERY DAY  .  clobetasol (TEMOVATE) 0.05 % external solution APPLY TO AFFECTED AREA TWICE A DAY  . griseofulvin (GRIFULVIN V) 500 MG tablet Take 500 mg by mouth 2 (two) times daily. Dr Cheree Ditto  . hydrochlorothiazide (HYDRODIURIL) 50 MG tablet TAKE 1 TABLET BY MOUTH EVERY DAY  . ketoconazole (NIZORAL) 2 % shampoo graham  . losartan (COZAAR) 25 MG tablet TAKE 2 TABLETS BY MOUTH EVERY DAY  . pantoprazole (PROTONIX) 40 MG tablet Take 2 tablets (80 mg total) by mouth daily. Dr Marva Panda  . simvastatin (ZOCOR) 20 MG tablet TAKE 1 TABLET BY MOUTH EVERY DAY  . tranexamic acid (LYSTEDA) 650 MG TABS tablet Take 325 mg by mouth 2 (two) times daily. graham    PHQ 2/9 Scores 03/29/2019 12/28/2016 07/19/2014  PHQ - 2 Score 0 0 0  PHQ- 9 Score 1 0 -    GAD 7 : Generalized Anxiety Score 03/29/2019  Nervous, Anxious, on Edge 0  Control/stop worrying 0  Worry too much - different things 0  Trouble relaxing 0  Restless 0  Easily annoyed or irritable 0  Afraid - awful might happen 0  Total GAD 7 Score 0    BP Readings from Last 3 Encounters:  09/26/19 118/64  03/29/19 130/82  11/18/18 130/80    Physical Exam  Wt Readings from Last 3 Encounters:  09/26/19 208 lb (94.3 kg)  03/29/19 221 lb (100.2 kg)  11/18/18 200 lb (90.7 kg)    BP 118/64   Pulse 77   Ht 5\' 4"  (1.626 m)   Wt 208 lb (94.3 kg)   LMP  (LMP Unknown) Comment: menopausal, denies preg  SpO2 99%   BMI 35.70 kg/m   Assessment and Plan: 1. Benign hypertension Chronic.  Controlled.  Stable.  There was some misunderstanding about patient's medication which we hope with clarified.  Patient will continue hydrochlorothiazide 50 mg once a day, losartan 25 mg 2 tablets a day.  In addition patient will continue amlodipine but we will increase to 10 mg instead of taking to 5 5mg  for a total of 10.  I reviewed patient's CMP that was done with rheumatology at [facility]. - hydrochlorothiazide (HYDRODIURIL) 50 MG tablet; Take 1 tablet (50 mg total) by mouth daily.   Dispense: 90 tablet; Refill: 1 - losartan (COZAAR) 25 MG tablet; Take 2 tablets (50 mg total) by mouth daily.  Dispense: 60 tablet; Refill: 5 - amLODipine (NORVASC) 10 MG tablet; Take 1 tablet (10 mg total) by mouth daily.  Dispense: 30 tablet; Refill: 5  2. Gastroesophageal reflux disease Chronic.  Controlled.  Stable.  Continue pantoprazole 40 mg once a day. - pantoprazole (PROTONIX) 40 MG tablet; Take 2 tablets (80 mg total) by mouth daily. Dr  Dispense: 180 tablet; Refill: 1  3. Familial multiple lipoprotein-type hyperlipidemia Chronic.  Controlled.  Stable.  Will continue simvastatin 20 mg once a day and check lipid panel. - simvastatin (ZOCOR) 20 MG tablet; Take 1 tablet (20 mg total) by mouth daily.  Dispense: 30 tablet; Refill: 5 - Lipid  Panel With LDL/HDL Ratio

## 2019-10-12 DIAGNOSIS — J849 Interstitial pulmonary disease, unspecified: Secondary | ICD-10-CM | POA: Diagnosis not present

## 2019-10-12 DIAGNOSIS — J8410 Pulmonary fibrosis, unspecified: Secondary | ICD-10-CM | POA: Diagnosis not present

## 2019-10-12 DIAGNOSIS — I288 Other diseases of pulmonary vessels: Secondary | ICD-10-CM | POA: Diagnosis not present

## 2019-10-12 DIAGNOSIS — R918 Other nonspecific abnormal finding of lung field: Secondary | ICD-10-CM | POA: Diagnosis not present

## 2019-10-12 DIAGNOSIS — R599 Enlarged lymph nodes, unspecified: Secondary | ICD-10-CM | POA: Diagnosis not present

## 2019-10-23 DIAGNOSIS — L738 Other specified follicular disorders: Secondary | ICD-10-CM | POA: Diagnosis not present

## 2019-10-23 DIAGNOSIS — L817 Pigmented purpuric dermatosis: Secondary | ICD-10-CM | POA: Diagnosis not present

## 2019-10-24 DIAGNOSIS — J849 Interstitial pulmonary disease, unspecified: Secondary | ICD-10-CM | POA: Diagnosis not present

## 2019-11-22 DIAGNOSIS — R9389 Abnormal findings on diagnostic imaging of other specified body structures: Secondary | ICD-10-CM | POA: Diagnosis not present

## 2019-11-22 DIAGNOSIS — J849 Interstitial pulmonary disease, unspecified: Secondary | ICD-10-CM | POA: Diagnosis not present

## 2019-11-22 DIAGNOSIS — R899 Unspecified abnormal finding in specimens from other organs, systems and tissues: Secondary | ICD-10-CM | POA: Diagnosis not present

## 2019-11-22 DIAGNOSIS — Z79899 Other long term (current) drug therapy: Secondary | ICD-10-CM | POA: Diagnosis not present

## 2019-11-30 DIAGNOSIS — Z961 Presence of intraocular lens: Secondary | ICD-10-CM | POA: Diagnosis not present

## 2019-11-30 DIAGNOSIS — H04123 Dry eye syndrome of bilateral lacrimal glands: Secondary | ICD-10-CM | POA: Diagnosis not present

## 2019-11-30 DIAGNOSIS — D869 Sarcoidosis, unspecified: Secondary | ICD-10-CM | POA: Diagnosis not present

## 2019-11-30 DIAGNOSIS — H35033 Hypertensive retinopathy, bilateral: Secondary | ICD-10-CM | POA: Diagnosis not present

## 2019-12-19 DIAGNOSIS — R590 Localized enlarged lymph nodes: Secondary | ICD-10-CM | POA: Diagnosis not present

## 2019-12-20 ENCOUNTER — Other Ambulatory Visit (HOSPITAL_COMMUNITY): Payer: Self-pay | Admitting: Pulmonary Disease

## 2019-12-20 ENCOUNTER — Other Ambulatory Visit: Admission: RE | Admit: 2019-12-20 | Payer: BC Managed Care – PPO | Source: Ambulatory Visit

## 2019-12-20 ENCOUNTER — Other Ambulatory Visit: Payer: Self-pay | Admitting: Pulmonary Disease

## 2019-12-20 DIAGNOSIS — R591 Generalized enlarged lymph nodes: Secondary | ICD-10-CM

## 2019-12-25 ENCOUNTER — Other Ambulatory Visit: Admission: RE | Admit: 2019-12-25 | Payer: BC Managed Care – PPO | Source: Ambulatory Visit

## 2019-12-27 ENCOUNTER — Encounter: Admission: RE | Payer: Self-pay | Source: Home / Self Care

## 2019-12-27 ENCOUNTER — Ambulatory Visit: Admission: RE | Admit: 2019-12-27 | Payer: BC Managed Care – PPO | Source: Home / Self Care

## 2019-12-27 SURGERY — BRONCHOSCOPY, FLEXIBLE
Anesthesia: General

## 2020-01-12 ENCOUNTER — Other Ambulatory Visit: Payer: Self-pay | Admitting: Family Medicine

## 2020-01-12 DIAGNOSIS — I10 Essential (primary) hypertension: Secondary | ICD-10-CM

## 2020-02-09 ENCOUNTER — Other Ambulatory Visit: Payer: Self-pay | Admitting: Family Medicine

## 2020-02-09 DIAGNOSIS — J4521 Mild intermittent asthma with (acute) exacerbation: Secondary | ICD-10-CM

## 2020-02-11 NOTE — Telephone Encounter (Signed)
Requested Prescriptions  Pending Prescriptions Disp Refills  . albuterol (VENTOLIN HFA) 108 (90 Base) MCG/ACT inhaler [Pharmacy Med Name: ALBUTEROL HFA (PROVENTIL) INH] 6.7 each 1    Sig: INHALE 2 PUFFS EVERY 6 HOURS AS NEEDED WHEEZING     Pulmonology:  Beta Agonists Failed - 02/09/2020  7:47 AM      Failed - One inhaler should last at least one month. If the patient is requesting refills earlier, contact the patient to check for uncontrolled symptoms.      Passed - Valid encounter within last 12 months    Recent Outpatient Visits          4 months ago Benign hypertension   [facility] Duanne Limerick, MD   10 months ago Reactive airway disease, mild intermittent, with acute exacerbation   [facility] Duanne Limerick, MD   1 year ago Benign hypertension   [facility] Duanne Limerick, MD   1 year ago Benign hypertension   [facility] Duanne Limerick, MD   2 years ago Community acquired pneumonia of left lower lobe of lung [facility])   [facility] Duanne Limerick, MD

## 2020-02-13 ENCOUNTER — Other Ambulatory Visit: Payer: Self-pay | Admitting: Family Medicine

## 2020-02-13 DIAGNOSIS — I10 Essential (primary) hypertension: Secondary | ICD-10-CM

## 2020-03-07 ENCOUNTER — Other Ambulatory Visit: Payer: Self-pay | Admitting: Family Medicine

## 2020-03-07 DIAGNOSIS — I10 Essential (primary) hypertension: Secondary | ICD-10-CM

## 2020-03-26 ENCOUNTER — Other Ambulatory Visit: Payer: Self-pay | Admitting: Family Medicine

## 2020-03-26 DIAGNOSIS — J4521 Mild intermittent asthma with (acute) exacerbation: Secondary | ICD-10-CM

## 2020-03-27 ENCOUNTER — Other Ambulatory Visit: Payer: Self-pay

## 2020-03-27 ENCOUNTER — Ambulatory Visit (INDEPENDENT_AMBULATORY_CARE_PROVIDER_SITE_OTHER): Payer: BC Managed Care – PPO | Admitting: Family Medicine

## 2020-03-27 ENCOUNTER — Encounter: Payer: Self-pay | Admitting: Family Medicine

## 2020-03-27 DIAGNOSIS — E7849 Other hyperlipidemia: Secondary | ICD-10-CM | POA: Diagnosis not present

## 2020-03-27 DIAGNOSIS — M79662 Pain in left lower leg: Secondary | ICD-10-CM | POA: Diagnosis not present

## 2020-03-27 DIAGNOSIS — M1712 Unilateral primary osteoarthritis, left knee: Secondary | ICD-10-CM

## 2020-03-27 DIAGNOSIS — I10 Essential (primary) hypertension: Secondary | ICD-10-CM | POA: Diagnosis not present

## 2020-03-27 MED ORDER — SIMVASTATIN 20 MG PO TABS: 20.0000 mg | ORAL_TABLET | Freq: Every day | ORAL | 1 refills | Status: DC

## 2020-03-27 MED ORDER — LOSARTAN POTASSIUM 25 MG PO TABS: 25.0000 mg | ORAL_TABLET | Freq: Two times a day (BID) | ORAL | 1 refills | Status: DC

## 2020-03-27 MED ORDER — CELECOXIB 200 MG PO CAPS: ORAL_CAPSULE | ORAL | 5 refills | Status: DC

## 2020-03-27 MED ORDER — HYDROCHLOROTHIAZIDE 50 MG PO TABS: 50.0000 mg | ORAL_TABLET | Freq: Every day | ORAL | 1 refills | Status: DC

## 2020-03-27 MED ORDER — AMLODIPINE BESYLATE 10 MG PO TABS: 10.0000 mg | ORAL_TABLET | Freq: Every day | ORAL | 1 refills | Status: DC

## 2020-03-27 NOTE — Progress Notes (Signed)
Date:  03/27/2020   Name:  Susan Gregory   DOB:  [birth year]   MRN:  [id number]   Chief Complaint: Hypertension, Hyperlipidemia, and Arthritis  Hypertension This is a chronic problem. The current episode started more than 1 year ago. The problem has been waxing and waning since onset. The problem is controlled. Pertinent negatives include no anxiety, blurred vision, chest pain, headaches, malaise/fatigue, neck pain, orthopnea, palpitations, peripheral edema, PND, shortness of breath or sweats. There are no associated agents to hypertension. There are no known risk factors for coronary artery disease. Past treatments include calcium channel blockers, angiotensin blockers and diuretics. The current treatment provides moderate improvement. There are no compliance problems.  There is no history of angina, kidney disease, CAD/MI, CVA, heart failure, left ventricular hypertrophy, PVD or retinopathy. There is no history of chronic renal disease, a hypertension causing med or renovascular disease.  Hyperlipidemia This is a chronic problem. The current episode started more than 1 year ago. The problem is controlled. Recent lipid tests were reviewed and are normal. She has no history of chronic renal disease, diabetes, hypothyroidism, liver disease, obesity or nephrotic syndrome. Factors aggravating her hyperlipidemia include thiazides. Pertinent negatives include no chest pain, focal sensory loss, focal weakness, leg pain, myalgias or shortness of breath. Current antihyperlipidemic treatment includes statins. The current treatment provides moderate improvement of lipids. Risk factors for coronary artery disease include dyslipidemia and hypertension.  Arthritis Presents for follow-up visit. She reports no pain, stiffness, joint swelling or joint warmth. The symptoms have been stable. Affected locations include the left knee and right knee. Her pain is at a severity of 7/10. Pertinent negatives include no  diarrhea, dysuria, fatigue, fever or rash.    Lab Results  Component Value Date   CREATININE 0.92 09/26/2018   BUN 13 09/26/2018   NA 142 09/26/2018   K 3.8 09/26/2018   CL 101 09/26/2018   CO2 25 09/26/2018   Lab Results  Component Value Date   CHOL 179 09/26/2019   HDL 46 09/26/2019   LDLCALC 119 (H) 09/26/2019   TRIG 72 09/26/2019   CHOLHDL 3.9 09/26/2019   No results found for: TSH No results found for: HGBA1C Lab Results  Component Value Date   WBC 3.5 (L) 08/15/2017   HGB 14.5 08/15/2017   HCT 42.2 08/15/2017   MCV 86.6 08/15/2017   PLT 181 08/15/2017   Lab Results  Component Value Date   ALT 22 09/26/2018   AST 18 09/26/2018   ALKPHOS 68 09/26/2018   BILITOT 0.5 09/26/2018     Review of Systems  Constitutional: Negative.  Negative for chills, fatigue, fever, malaise/fatigue and unexpected weight change.  HENT: Negative for congestion, ear discharge, ear pain, rhinorrhea, sinus pressure, sneezing and sore throat.   Eyes: Negative for blurred vision, double vision, photophobia, pain, discharge, redness and itching.  Respiratory: Negative for cough, shortness of breath, wheezing and stridor.   Cardiovascular: Negative for chest pain, palpitations, orthopnea and PND.  Gastrointestinal: Negative for abdominal pain, blood in stool, constipation, diarrhea, nausea and vomiting.  Endocrine: Negative for cold intolerance, heat intolerance, polydipsia, polyphagia and polyuria.  Genitourinary: Negative for dysuria, flank pain, frequency, hematuria, menstrual problem, pelvic pain, urgency, vaginal bleeding and vaginal discharge.  Musculoskeletal: Positive for arthralgias and arthritis. Negative for back pain, joint swelling, myalgias, neck pain and stiffness.  Skin: Negative for rash.  Allergic/Immunologic: Negative for environmental allergies and food allergies.  Neurological: Negative for dizziness, focal weakness, weakness, light-headedness, numbness  and headaches.   Hematological: Negative for adenopathy. Does not bruise/bleed easily.  Psychiatric/Behavioral: Negative for dysphoric mood. The patient is not nervous/anxious.     Patient Active Problem List   Diagnosis Date Noted  . Familial multiple lipoprotein-type hyperlipidemia 07/18/2014  . Disorder of lipoprotein and lipid metabolism 07/18/2014  . Benign hypertension 07/18/2014  . Acid reflux 07/18/2014  . Encounter for general adult medical examination without abnormal findings 07/18/2014  . Neck sprain and strain 07/18/2014  . Abnormal breast finding 07/18/2014  . Abnormal finding on breast imaging 07/18/2014  . Arthritis of knee, degenerative 07/18/2014  . TB skin/subcutaneous 07/18/2014  . Fecal occult blood test positive 11/01/2013    No Known Allergies  Past Surgical History:  Procedure Laterality Date  . BREAST BIOPSY Right 09/04/2016   US guided right breast biopsy. Coil marker neg  . BREAST BIOPSY Right 09/07/2016   stereo biopsy. Ribbon marker neg  . COLONOSCOPY    . COLONOSCOPY WITH PROPOFOL N/A 11/18/2018   Procedure: COLONOSCOPY WITH PROPOFOL;  Surgeon: Christena Deem, MD;  Location: [facility] ENDOSCOPY;  Service: Endoscopy;  Laterality: N/A;  . ESOPHAGOGASTRODUODENOSCOPY (EGD) WITH PROPOFOL N/A 04/20/2016   Procedure: ESOPHAGOGASTRODUODENOSCOPY (EGD) WITH PROPOFOL;  Surgeon: Christena Deem, MD;  Location: [facility] ENDOSCOPY;  Service: Endoscopy;  Laterality: N/A;  . ESOPHAGOGASTRODUODENOSCOPY (EGD) WITH PROPOFOL N/A 11/18/2018   Procedure: ESOPHAGOGASTRODUODENOSCOPY (EGD) WITH PROPOFOL;  Surgeon: Christena Deem, MD;  Location: [facility] ENDOSCOPY;  Service: Endoscopy;  Laterality: N/A;  . THROAT SURGERY     nodules taken off throat  . TUBAL LIGATION      Social History   Tobacco Use  . Smoking status: Never Smoker  . Smokeless tobacco: Never Used  Vaping Use  . Vaping Use: Never used  Substance Use Topics  . Alcohol use: No    Alcohol/week: 0.0 standard drinks  .  Drug use: No     Medication list has been reviewed and updated.  Current Meds  Medication Sig  . acetaminophen (TYLENOL) 500 MG tablet Take 1,000 mg by mouth every 6 (six) hours as needed for moderate pain or headache.  . albuterol (VENTOLIN HFA) 108 (90 Base) MCG/ACT inhaler INHALE 2 PUFFS EVERY 6 HOURS AS NEEDED FOR WHEEZING  . amLODipine (NORVASC) 10 MG tablet TAKE 1 TABLET BY MOUTH EVERY DAY  . aspirin 81 MG tablet Take 81 mg by mouth daily.   . Carboxymethylcellul-Glycerin (CLEAR EYES FOR DRY EYES OP) Place 1 drop into both eyes daily as needed (dry eyes).  . celecoxib (CELEBREX) 200 MG capsule TAKE 1 CAPSULE BY MOUTH EVERY DAY (Patient taking differently: Take 200 mg by mouth daily.)  . hydrochlorothiazide (HYDRODIURIL) 50 MG tablet TAKE 1 TABLET BY MOUTH EVERY DAY  . losartan (COZAAR) 25 MG tablet Take 2 tablets (50 mg total) by mouth daily.  . Multiple Vitamin (MULTIVITAMIN WITH MINERALS) TABS tablet Take 1 tablet by mouth 2 (two) times a week.  . pantoprazole (PROTONIX) 40 MG tablet Take 2 tablets (80 mg total) by mouth daily. Dr Marva Panda (Patient taking differently: Take 40 mg by mouth daily. Dr Marva Panda)  . simvastatin (ZOCOR) 20 MG tablet Take 1 tablet (20 mg total) by mouth daily.    PHQ 2/9 Scores 03/27/2020 03/29/2019 12/28/2016 07/19/2014  PHQ - 2 Score 0 0 0 0  PHQ- 9 Score 0 1 0 -    GAD 7 : Generalized Anxiety Score 03/27/2020 03/29/2019  Nervous, Anxious, on Edge 0 0  Control/stop worrying 0 0  Worry  too much - different things 0 0  Trouble relaxing 0 0  Restless 0 0  Easily annoyed or irritable 0 0  Afraid - awful might happen 0 0  Total GAD 7 Score 0 0    BP Readings from Last 3 Encounters:  03/27/20 130/88  09/26/19 118/64  03/29/19 130/82    Physical Exam Vitals and nursing note reviewed.  Constitutional:      Appearance: She is well-developed and well-nourished.  HENT:     Head: Normocephalic.     Right Ear: Tympanic membrane, ear canal and external  ear normal. There is no impacted cerumen.     Left Ear: Tympanic membrane, ear canal and external ear normal.     Nose: No congestion or rhinorrhea.     Mouth/Throat:     Mouth: Oropharynx is clear and moist. Mucous membranes are moist.  Eyes:     General: Lids are everted, no foreign bodies appreciated. No scleral icterus.       Left eye: No foreign body or hordeolum.     Extraocular Movements: EOM normal.     Conjunctiva/sclera: Conjunctivae normal.     Right eye: Right conjunctiva is not injected.     Left eye: Left conjunctiva is not injected.     Pupils: Pupils are equal, round, and reactive to light.  Neck:     Thyroid: No thyromegaly.     Vascular: No carotid bruit or JVD.     Trachea: No tracheal deviation.  Cardiovascular:     Rate and Rhythm: Normal rate and regular rhythm.     Pulses: Intact distal pulses.     Heart sounds: Normal heart sounds. No murmur heard. No friction rub. No gallop.   Pulmonary:     Effort: Pulmonary effort is normal. No respiratory distress.     Breath sounds: Normal breath sounds. No stridor. No wheezing, rhonchi or rales.  Chest:     Chest wall: No tenderness.  Abdominal:     General: Bowel sounds are normal.     Palpations: Abdomen is soft. There is no hepatosplenomegaly or mass.     Tenderness: There is no abdominal tenderness. There is no right CVA tenderness, left CVA tenderness, guarding or rebound.  Musculoskeletal:        General: No tenderness or edema. Normal range of motion.     Cervical back: Normal range of motion and neck supple.  Lymphadenopathy:     Cervical: No cervical adenopathy.  Skin:    General: Skin is warm.     Capillary Refill: Capillary refill takes less than 2 seconds.     Findings: No rash.  Neurological:     Mental Status: She is alert and oriented to person, place, and time.     Cranial Nerves: No cranial nerve deficit.     Sensory: No sensory deficit.     Deep Tendon Reflexes: Strength normal. Reflexes  normal.  Psychiatric:        Mood and Affect: Mood and affect normal. Mood is not anxious or depressed.     Wt Readings from Last 3 Encounters:  03/27/20 209 lb (94.8 kg)  09/26/19 208 lb (94.3 kg)  03/29/19 221 lb (100.2 kg)    BP 130/88   Pulse 80   Ht 5\' 4"  (1.626 m)   Wt 209 lb (94.8 kg)   LMP  (LMP Unknown) Comment: menopausal, denies preg  BMI 35.87 kg/m   Assessment and Plan: 1. Benign hypertension Chronic.  Controlled.  Stable.  Continue amlodipine 10 mg once a day, hydrochlorothiazide 50 mg once a day, and losartan 25 mg twice a day.  Will check CMP for electrolytes and GFR. - amLODipine (NORVASC) 10 MG tablet; Take 1 tablet (10 mg total) by mouth daily.  Dispense: 90 tablet; Refill: 1 - hydrochlorothiazide (HYDRODIURIL) 50 MG tablet; Take 1 tablet (50 mg total) by mouth daily.  Dispense: 90 tablet; Refill: 1 - losartan (COZAAR) 25 MG tablet; Take 1 tablet (25 mg total) by mouth in the morning and at bedtime.  Dispense: 180 tablet; Refill: 1 - Comprehensive Metabolic Panel (CMET)  2. Primary osteoarthritis of left knee .  Controlled.  Stable.  Exam is consistent with mild tenderness along the medial joint line bilateral.  Patient will continue Celebrex 200 mg once a day. - celecoxib (CELEBREX) 200 MG capsule; TAKE 1 CAPSULE BY MOUTH EVERY DAY  Dispense: 30 capsule; Refill: 5  3. Pain of left lower leg Chronic.  Controlled.  Stable.  Continue Celebrex 200 mg once a day. - celecoxib (CELEBREX) 200 MG capsule; TAKE 1 CAPSULE BY MOUTH EVERY DAY  Dispense: 30 capsule; Refill: 5  4. Familial multiple lipoprotein-type hyperlipidemia Chronic.  Controlled.  Stable.  Continue simvastatin 20 mg once a day.  Patient will be returning tomorrow next week for lipid panel in a fasting condition for which we will adjust accordingly. - simvastatin (ZOCOR) 20 MG tablet; Take 1 tablet (20 mg total) by mouth daily.  Dispense: 90 tablet; Refill: 1 - Lipid Panel With LDL/HDL Ratio

## 2020-03-28 LAB — COMPREHENSIVE METABOLIC PANEL
ALT: 23 IU/L (ref 0–32)
AST: 22 IU/L (ref 0–40)
Albumin/Globulin Ratio: 1.2 (ref 1.2–2.2)
Albumin: 4.5 g/dL (ref 3.8–4.8)
Alkaline Phosphatase: 75 IU/L (ref 44–121)
BUN/Creatinine Ratio: 14 (ref 12–28)
BUN: 12 mg/dL (ref 8–27)
Bilirubin Total: 0.4 mg/dL (ref 0.0–1.2)
CO2: 27 mmol/L (ref 20–29)
Calcium: 10 mg/dL (ref 8.7–10.3)
Chloride: 103 mmol/L (ref 96–106)
Creatinine, Ser: 0.86 mg/dL (ref 0.57–1.00)
GFR calc Af Amer: 84 mL/min/{1.73_m2} (ref >59)
GFR calc non Af Amer: 73 mL/min/{1.73_m2} (ref >59)
Globulin, Total: 3.8 g/dL (ref 1.5–4.5)
Glucose: 87 mg/dL (ref 65–99)
Potassium: 4 mmol/L (ref 3.5–5.2)
Sodium: 143 mmol/L (ref 134–144)
Total Protein: 8.3 g/dL (ref 6.0–8.5)

## 2020-03-28 LAB — LIPID PANEL WITH LDL/HDL RATIO
Cholesterol, Total: 203 mg/dL — ABNORMAL HIGH (ref 100–199)
HDL: 58 mg/dL (ref >39)
LDL Chol Calc (NIH): 123 mg/dL — ABNORMAL HIGH (ref 0–99)
LDL/HDL Ratio: 2.1 ratio (ref 0.0–3.2)
Triglycerides: 126 mg/dL (ref 0–149)
VLDL Cholesterol Cal: 22 mg/dL (ref 5–40)

## 2020-04-29 DIAGNOSIS — L918 Other hypertrophic disorders of the skin: Secondary | ICD-10-CM | POA: Diagnosis not present

## 2020-04-29 DIAGNOSIS — L817 Pigmented purpuric dermatosis: Secondary | ICD-10-CM | POA: Diagnosis not present

## 2020-04-29 DIAGNOSIS — L738 Other specified follicular disorders: Secondary | ICD-10-CM | POA: Diagnosis not present

## 2020-06-10 ENCOUNTER — Ambulatory Visit: Payer: BC Managed Care – PPO

## 2020-06-18 DIAGNOSIS — J479 Bronchiectasis, uncomplicated: Secondary | ICD-10-CM | POA: Diagnosis not present

## 2020-06-18 DIAGNOSIS — R9389 Abnormal findings on diagnostic imaging of other specified body structures: Secondary | ICD-10-CM | POA: Diagnosis not present

## 2020-06-18 DIAGNOSIS — R899 Unspecified abnormal finding in specimens from other organs, systems and tissues: Secondary | ICD-10-CM | POA: Diagnosis not present

## 2020-07-02 ENCOUNTER — Ambulatory Visit
Admission: RE | Admit: 2020-07-02 | Discharge: 2020-07-02 | Disposition: A | Discharge status: Home / Self Care | Payer: BC Managed Care – PPO | Source: Ambulatory Visit | Attending: Pulmonary Disease | Admitting: Pulmonary Disease

## 2020-07-02 ENCOUNTER — Other Ambulatory Visit: Payer: Self-pay

## 2020-07-02 DIAGNOSIS — R591 Generalized enlarged lymph nodes: Secondary | ICD-10-CM | POA: Diagnosis not present

## 2020-07-02 DIAGNOSIS — J479 Bronchiectasis, uncomplicated: Secondary | ICD-10-CM | POA: Diagnosis not present

## 2020-07-02 DIAGNOSIS — J84112 Idiopathic pulmonary fibrosis: Secondary | ICD-10-CM | POA: Diagnosis not present

## 2020-07-02 DIAGNOSIS — I288 Other diseases of pulmonary vessels: Secondary | ICD-10-CM | POA: Diagnosis not present

## 2020-07-02 DIAGNOSIS — R0600 Dyspnea, unspecified: Secondary | ICD-10-CM | POA: Diagnosis not present

## 2020-07-02 LAB — POCT I-STAT CREATININE: Creatinine, Ser: 0.8 mg/dL (ref 0.44–1.00)

## 2020-07-02 MED ORDER — IOHEXOL 300 MG/ML  SOLN
75.0000 mL | Freq: Once | INTRAMUSCULAR | Status: AC | PRN
  Administered 2020-07-02: 75 mL via INTRAVENOUS

## 2020-07-16 DIAGNOSIS — D860 Sarcoidosis of lung: Secondary | ICD-10-CM | POA: Diagnosis not present

## 2020-07-24 ENCOUNTER — Other Ambulatory Visit: Payer: Self-pay | Admitting: Family Medicine

## 2020-07-24 DIAGNOSIS — Z1231 Encounter for screening mammogram for malignant neoplasm of breast: Secondary | ICD-10-CM

## 2020-07-30 ENCOUNTER — Other Ambulatory Visit: Payer: Self-pay | Admitting: Family Medicine

## 2020-07-30 DIAGNOSIS — E7849 Other hyperlipidemia: Secondary | ICD-10-CM

## 2020-08-25 ENCOUNTER — Other Ambulatory Visit: Payer: Self-pay | Admitting: Family Medicine

## 2020-08-25 DIAGNOSIS — E7849 Other hyperlipidemia: Secondary | ICD-10-CM

## 2020-08-25 NOTE — Telephone Encounter (Signed)
Last RF 03/27/20 #90 1 RF  Pt overdue blood work. Sent pt message MyChart to call office to make appt. Requested Prescriptions  Pending Prescriptions Disp Refills   simvastatin (ZOCOR) 20 MG tablet [Pharmacy Med Name: SIMVASTATIN 20 MG TABLET] 60 tablet 2    Sig: TAKE 1 TABLET BY MOUTH EVERY DAY      Cardiovascular:  Antilipid - Statins Failed - 08/25/2020  9:00 AM      Failed - Total Cholesterol in normal range and within 360 days    Cholesterol, Total  Date Value Ref Range Status  03/27/2020 203 (H) 100 - 199 mg/dL Final          Failed - LDL in normal range and within 360 days    LDL Chol Calc (NIH)  Date Value Ref Range Status  03/27/2020 123 (H) 0 - 99 mg/dL Final          Passed - HDL in normal range and within 360 days    HDL  Date Value Ref Range Status  03/27/2020 58 >39 mg/dL Final          Passed - Triglycerides in normal range and within 360 days    Triglycerides  Date Value Ref Range Status  03/27/2020 126 0 - 149 mg/dL Final          Passed - Patient is not pregnant      Passed - Valid encounter within last 12 months    Recent Outpatient Visits           5 months ago Benign hypertension   [facility] Duanne Limerick, MD   11 months ago Benign hypertension   [facility] Duanne Limerick, MD   1 year ago Reactive airway disease, mild intermittent, with acute exacerbation   [facility] Duanne Limerick, MD   1 year ago Benign hypertension   [facility] Duanne Limerick, MD   2 years ago Benign hypertension   [facility] Duanne Limerick, MD

## 2020-08-29 ENCOUNTER — Other Ambulatory Visit: Payer: Self-pay

## 2020-08-29 ENCOUNTER — Encounter: Payer: Self-pay | Admitting: Family Medicine

## 2020-08-29 ENCOUNTER — Ambulatory Visit (INDEPENDENT_AMBULATORY_CARE_PROVIDER_SITE_OTHER): Payer: BC Managed Care – PPO | Admitting: Family Medicine

## 2020-08-29 ENCOUNTER — Other Ambulatory Visit: Payer: Self-pay | Admitting: Family Medicine

## 2020-08-29 VITALS — BP 130/62 | HR 76 | Ht 64.0 in | Wt 204.0 lb

## 2020-08-29 DIAGNOSIS — Z6835 Body mass index (BMI) 35.0-35.9, adult: Secondary | ICD-10-CM | POA: Diagnosis not present

## 2020-08-29 DIAGNOSIS — K219 Gastro-esophageal reflux disease without esophagitis: Secondary | ICD-10-CM

## 2020-08-29 DIAGNOSIS — I10 Essential (primary) hypertension: Secondary | ICD-10-CM | POA: Diagnosis not present

## 2020-08-29 DIAGNOSIS — E7849 Other hyperlipidemia: Secondary | ICD-10-CM | POA: Diagnosis not present

## 2020-08-29 MED ORDER — AMLODIPINE BESYLATE 10 MG PO TABS: 10.0000 mg | ORAL_TABLET | Freq: Every day | ORAL | 1 refills | Status: DC

## 2020-08-29 MED ORDER — SIMVASTATIN 20 MG PO TABS: 20.0000 mg | ORAL_TABLET | Freq: Every day | ORAL | 1 refills | Status: DC

## 2020-08-29 MED ORDER — HYDROCHLOROTHIAZIDE 50 MG PO TABS: 50.0000 mg | ORAL_TABLET | Freq: Every day | ORAL | 1 refills | Status: DC

## 2020-08-29 MED ORDER — PANTOPRAZOLE SODIUM 40 MG PO TBEC: 40.0000 mg | DELAYED_RELEASE_TABLET | Freq: Every day | ORAL | 0 refills | Status: DC

## 2020-08-29 MED ORDER — LOSARTAN POTASSIUM 25 MG PO TABS: 25.0000 mg | ORAL_TABLET | Freq: Two times a day (BID) | ORAL | 1 refills | Status: DC

## 2020-08-29 NOTE — Progress Notes (Signed)
Date:  08/29/2020   Name:  Susan Gregory   DOB:  06/26/1960   MRN:  [id number]   Chief Complaint: Hypertension and Hyperlipidemia  Hypertension This is a chronic problem. The current episode started more than 1 year ago. The problem has been gradually improving since onset. The problem is controlled. Pertinent negatives include no anxiety, blurred vision, chest pain, headaches, malaise/fatigue, neck pain, orthopnea, palpitations, peripheral edema, PND, shortness of breath or sweats. There are no associated agents to hypertension. Risk factors for coronary artery disease include dyslipidemia. Past treatments include calcium channel blockers, angiotensin blockers and diuretics. The current treatment provides moderate improvement. There are no compliance problems.  There is no history of angina, kidney disease, CAD/MI, CVA, heart failure, left ventricular hypertrophy, PVD or retinopathy. There is no history of chronic renal disease, a hypertension causing med or renovascular disease.  Hyperlipidemia This is a chronic problem. The current episode started more than 1 year ago. Recent lipid tests were reviewed and are normal. She has no history of chronic renal disease or diabetes. Factors aggravating her hyperlipidemia include thiazides. Pertinent negatives include no chest pain, focal sensory loss, focal weakness, leg pain, myalgias or shortness of breath. Current antihyperlipidemic treatment includes statins. The current treatment provides moderate improvement of lipids. There are no compliance problems.  Risk factors for coronary artery disease include dyslipidemia and hypertension.   Lab Results  Component Value Date   CREATININE 0.80 07/02/2020   BUN 12 03/27/2020   NA 143 03/27/2020   K 4.0 03/27/2020   CL 103 03/27/2020   CO2 27 03/27/2020   Lab Results  Component Value Date   CHOL 203 (H) 03/27/2020   HDL 58 03/27/2020   LDLCALC 123 (H) 03/27/2020   TRIG 126 03/27/2020    CHOLHDL 3.9 09/26/2019   No results found for: TSH No results found for: HGBA1C Lab Results  Component Value Date   WBC 3.5 (L) 08/15/2017   HGB 14.5 08/15/2017   HCT 42.2 08/15/2017   MCV 86.6 08/15/2017   PLT 181 08/15/2017   Lab Results  Component Value Date   ALT 23 03/27/2020   AST 22 03/27/2020   ALKPHOS 75 03/27/2020   BILITOT 0.4 03/27/2020     Review of Systems  Constitutional: Negative.  Negative for chills, fatigue, fever, malaise/fatigue and unexpected weight change.  HENT:  Negative for congestion, ear discharge, ear pain, rhinorrhea, sinus pressure, sneezing and sore throat.   Eyes:  Negative for blurred vision, photophobia, pain, discharge, redness and itching.  Respiratory:  Negative for cough, shortness of breath, wheezing and stridor.   Cardiovascular:  Negative for chest pain, palpitations, orthopnea and PND.  Gastrointestinal:  Negative for abdominal pain, blood in stool, constipation, diarrhea, nausea and vomiting.  Endocrine: Negative for cold intolerance, heat intolerance, polydipsia, polyphagia and polyuria.  Genitourinary:  Negative for dysuria, flank pain, frequency, hematuria, menstrual problem, pelvic pain, urgency, vaginal bleeding and vaginal discharge.  Musculoskeletal:  Negative for arthralgias, back pain, myalgias and neck pain.  Skin:  Negative for rash.  Allergic/Immunologic: Negative for environmental allergies and food allergies.  Neurological:  Negative for dizziness, focal weakness, weakness, light-headedness, numbness and headaches.  Hematological:  Negative for adenopathy. Does not bruise/bleed easily.  Psychiatric/Behavioral:  Negative for dysphoric mood. The patient is not nervous/anxious.    Patient Active Problem List   Diagnosis Date Noted   Familial multiple lipoprotein-type hyperlipidemia 07/18/2014   Disorder of lipoprotein and lipid  metabolism 07/18/2014   Benign hypertension 07/18/2014   Acid reflux 07/18/2014   Encounter  for general adult medical examination without abnormal findings 07/18/2014   Neck sprain and strain 07/18/2014   Abnormal breast finding 07/18/2014   Abnormal finding on breast imaging 07/18/2014   Arthritis of knee, degenerative 07/18/2014   TB skin/subcutaneous 07/18/2014   Fecal occult blood test positive 11/01/2013    No Known Allergies  Past Surgical History:  Procedure Laterality Date   BREAST BIOPSY Right 09/04/2016   US guided right breast biopsy. Coil marker neg   BREAST BIOPSY Right 09/07/2016   stereo biopsy. Ribbon marker neg   COLONOSCOPY     COLONOSCOPY WITH PROPOFOL N/A 11/18/2018   Procedure: COLONOSCOPY WITH PROPOFOL;  Surgeon: Christena DeemSkulskie, Martin U, MD;  [facility];  Service: Endoscopy;  Laterality: N/A;   ESOPHAGOGASTRODUODENOSCOPY (EGD) WITH PROPOFOL N/A 04/20/2016   Procedure: ESOPHAGOGASTRODUODENOSCOPY (EGD) WITH PROPOFOL;  Surgeon: Christena DeemMartin U Skulskie, MD;  [facility];  Service: Endoscopy;  Laterality: N/A;   ESOPHAGOGASTRODUODENOSCOPY (EGD) WITH PROPOFOL N/A 11/18/2018   Procedure: ESOPHAGOGASTRODUODENOSCOPY (EGD) WITH PROPOFOL;  Surgeon: Christena DeemSkulskie, Martin U, MD;  [facility];  Service: Endoscopy;  Laterality: N/A;   THROAT SURGERY     nodules taken off throat   TUBAL LIGATION      Social History   Tobacco Use   Smoking status: Never   Smokeless tobacco: Never  Vaping Use   Vaping Use: Never used  Substance Use Topics   Alcohol use: No    Alcohol/week: 0.0 standard drinks   Drug use: No     Medication list has been reviewed and updated.  Current Meds  Medication Sig   acetaminophen (TYLENOL) 500 MG tablet Take 1,000 mg by mouth every 6 (six) hours as needed for moderate pain or headache.   albuterol (VENTOLIN HFA) 108 (90 Base) MCG/ACT inhaler INHALE 2 PUFFS EVERY 6 HOURS AS NEEDED FOR WHEEZING   amLODipine (NORVASC) 10 MG tablet Take 1 tablet (10 mg total) by mouth daily.   aspirin 81 MG tablet Take 81 mg by  mouth daily.    Carboxymethylcellul-Glycerin (CLEAR EYES FOR DRY EYES OP) Place 1 drop into both eyes daily as needed (dry eyes).   celecoxib (CELEBREX) 200 MG capsule TAKE 1 CAPSULE BY MOUTH EVERY DAY   clobetasol (TEMOVATE) 0.05 % external solution APPLY TO AFFECTED AREA TWICE A DAY   diphenhydrAMINE HCl, Sleep, 50 MG CAPS Take 50 mg by mouth at bedtime as needed (sleep). "jet sleep" otc sleep aid (50 mg)   hydrochlorothiazide (HYDRODIURIL) 50 MG tablet Take 1 tablet (50 mg total) by mouth daily.   losartan (COZAAR) 25 MG tablet Take 1 tablet (25 mg total) by mouth in the morning and at bedtime.   Multiple Vitamin (MULTIVITAMIN WITH MINERALS) TABS tablet Take 1 tablet by mouth 2 (two) times a week.   pantoprazole (PROTONIX) 40 MG tablet Take 2 tablets (80 mg total) by mouth daily. Dr Marva PandaSkulskie (Patient taking differently: Take 40 mg by mouth daily. Dr Marva PandaSkulskie)   simvastatin (ZOCOR) 20 MG tablet TAKE 1 TABLET BY MOUTH EVERY DAY    PHQ 2/9 Scores 08/29/2020 03/27/2020 03/29/2019 12/28/2016  PHQ - 2 Score 0 0 0 0  PHQ- 9 Score 0 0 1 0    GAD 7 : Generalized Anxiety Score 08/29/2020 03/27/2020 03/29/2019  Nervous, Anxious, on Edge 0 0 0  Control/stop worrying 0 0 0  Worry too much - different things 0 0 0  Trouble  relaxing 0 0 0  Restless 0 0 0  Easily annoyed or irritable 0 0 0  Afraid - awful might happen 0 0 0  Total GAD 7 Score 0 0 0    BP Readings from Last 3 Encounters:  08/29/20 130/62  03/27/20 130/88  09/26/19 118/64    Physical Exam Vitals and nursing note reviewed.  Constitutional:      Appearance: She is well-developed.  HENT:     Head: Normocephalic.     Right Ear: Tympanic membrane, ear canal and external ear normal.     Left Ear: Tympanic membrane, ear canal and external ear normal.     Nose: Nose normal. No congestion or rhinorrhea.  Eyes:     General: Lids are everted, no foreign bodies appreciated. No scleral icterus.       Left eye: No foreign body or  hordeolum.     Conjunctiva/sclera: Conjunctivae normal.     Right eye: Right conjunctiva is not injected.     Left eye: Left conjunctiva is not injected.     Pupils: Pupils are equal, round, and reactive to light.  Neck:     Thyroid: No thyromegaly.     Vascular: No JVD.     Trachea: No tracheal deviation.  Cardiovascular:     Rate and Rhythm: Normal rate and regular rhythm.     Heart sounds: Normal heart sounds. No murmur heard.   No friction rub. No gallop.  Pulmonary:     Effort: Pulmonary effort is normal. No respiratory distress.     Breath sounds: Normal breath sounds. No wheezing, rhonchi or rales.  Abdominal:     General: Bowel sounds are normal.     Palpations: Abdomen is soft. There is no mass.     Tenderness: There is no abdominal tenderness. There is no guarding or rebound.  Musculoskeletal:        General: No tenderness. Normal range of motion.     Cervical back: Normal range of motion and neck supple.  Lymphadenopathy:     Cervical: No cervical adenopathy.  Skin:    General: Skin is warm.     Findings: No bruising or rash.  Neurological:     Mental Status: She is alert and oriented to person, place, and time.     Cranial Nerves: No cranial nerve deficit.     Deep Tendon Reflexes: Reflexes normal.  Psychiatric:        Mood and Affect: Mood is not anxious or depressed.    Wt Readings from Last 3 Encounters:  08/29/20 204 lb (92.5 kg)  03/27/20 209 lb (94.8 kg)  09/26/19 208 lb (94.3 kg)    BP 130/62   Pulse 76   Ht 5\' 4"  (1.626 m)   Wt 204 lb (92.5 kg)   LMP  (LMP Unknown) Comment: menopausal, denies preg  BMI 35.02 kg/m   Assessment and Plan: 1. Benign hypertension Chronic.  Controlled.  Stable.  Blood pressure is 130/62.  We will continue amlodipine 10 mg once a day, hydrochlorothiazide 50 mg once a day, and losartan 25 mg 1 nightly. - amLODipine (NORVASC) 10 MG tablet; Take 1 tablet (10 mg total) by mouth daily.  Dispense: 90 tablet; Refill: 1 -  hydrochlorothiazide (HYDRODIURIL) 50 MG tablet; Take 1 tablet (50 mg total) by mouth daily.  Dispense: 90 tablet; Refill: 1 - losartan (COZAAR) 25 MG tablet; Take 1 tablet (25 mg total) by mouth in the morning and at bedtime.  Dispense: 180 tablet;  Refill: 1  2. Gastroesophageal reflux disease Chronic.  Controlled.  Stable.  Continue pantoprazole 40 mg once a day.  This will be refilled until she can see Neysa Bonito in [place] for further refills in the future. - pantoprazole (PROTONIX) 40 MG tablet; Take 1 tablet (40 mg total) by mouth daily. Dr Marva Panda  Dispense: 90 tablet; Refill: 0  3. Familial multiple lipoprotein-type hyperlipidemia Chronic.  Controlled.  Stable.  Continue simvastatin 20 mg once a day. - simvastatin (ZOCOR) 20 MG tablet; Take 1 tablet (20 mg total) by mouth daily.  Dispense: 90 tablet; Refill: 1  4. BMI 35.0-35.9,adult Health risks of being over weight were discussed and patient was counseled on weight loss options and exercise.

## 2020-08-29 NOTE — Patient Instructions (Signed)
GUIDELINES FOR  °LOW-CHOLESTEROL, LOW-TRIGLYCERIDE DIETS  °  °FOODS TO USE  ° °MEATS, FISH Choose lean meats (chicken, turkey, veal, and non-fatty cuts of beef with excess fat trimmed; one serving = 3 oz of cooked meat). Also, fresh or frozen fish, canned fish packed in water, and shellfish (lobster, crabs, shrimp, and oysters). Limit use to no more than one serving of one of these per week. Shellfish are high in cholesterol but low in saturated fat and should be used sparingly. Meats and fish should be broiled (pan or oven) or baked on a rack.  °EGGS Egg substitutes and egg whites (use freely). Egg yolks (limit two per week).  °FRUITS Eat three servings of fresh fruit per day (1 serving = ½ cup). Be sure to have at least one citrus fruit daily. Frozen and canned fruit with no sugar or syrup added may be used.  °VEGETABLES Most vegetables are not limited (see next page). One dark-green (string beans, escarole) or one deep yellow (squash) vegetable is recommended daily. Cauliflower, broccoli, and celery, as well as potato skins, are recommended for their fiber content. (Fiber is associated with cholesterol reduction) It is preferable to steam vegetables, but they may be boiled, strained, or braised with polyunsaturated vegetable oil (see below).  °BEANS Dried peas or beans (1 serving = ½ cup) may be used as a bread substitute.  °NUTS Almonds, walnuts, and peanuts may be used sparingly  °(1 serving = 1 Tablespoonful). Use pumpkin, sesame, or sunflower seeds.  °BREADS, GRAINS One roll or one slice of whole grain or enriched bread may be used, or three soda crackers or four pieces of melba toast as a substitute. Spaghetti, rice or noodles (½ cup) or ½ large ear of corn may be used as a bread substitute. In preparing these foods do not use butter or shortening, use soft margarine. Also use egg and sugar substitutes.  Choose high fiber grains, such as oats and whole wheat.  °CEREALS Use ½ cup of hot cereal or ¾ cup of  cold cereal per day. Add a sugar substitute if desired, with 99% fat free or skim milk.  °MILK PRODUCTS Always use 99% fat free or skim milk, dairy products such as low fat cheeses (farmer's uncreamed diet cottage), low-fat yogurt, and powdered skim milk.  °FATS, OILS Use soft (not stick) margarine; vegetable oils that are high in polyunsaturated fats (such as safflower, sunflower, soybean, corn, and cottonseed). Always refrigerate meat drippings to harden the fat and remove it before preparing gravies  °DESSERTS, SNACKS Limit to two servings per day; substitute each serving for a bread/cereal serving: ice milk, water sherbet (1/4 cup); unflavored gelatin or gelatin flavored with sugar substitute (1/3 cup); pudding prepared with skim milk (1/2 cup); egg white soufflés; unbuttered popcorn (1 ½ cups). Substitute carob for chocolate.  °BEVERAGES Fresh fruit juices (limit 4 oz per day); black coffee, plain or herbal teas; soft drinks with sugar substitutes; club soda, preferably salt-free; cocoa made with skim milk or nonfat dried milk and water (sugar substitute added if desired); clear broth. Alcohol: limit two servings per day (see second page).  °MISCELLANEOUS ° You may use the following freely: vinegar, spices, herbs, nonfat bouillon, mustard, Worcestershire sauce, soy sauce, flavoring essence.  ° ° ° ° ° ° ° ° ° ° ° ° ° ° ° ° °GUIDELINES FOR  °LOW-CHOLESTEROL, LOW TRIGLYCERIDE DIETS  °  °FOODS TO AVOID  ° °MEATS, FISH Marbled beef, pork, bacon, sausage, and other pork products; fatty   fowl (duck, goose); skin and fat of turkey and chicken; processed meats; luncheon meats (salami, bologna); frankfurters and fast-food hamburgers (theyre loaded with fat); organ meats (kidneys, liver); canned fish packed in oil.  °EGGS Limit egg yolks to two per week.   °FRUITS Coconuts (rich in saturated fats).  °VEGETABLES Avoid avocados. Starchy vegetables (potatoes, corn, lima beans, dried peas, beans) may be used only if  substitutes for a serving of bread or cereal. (Baked potato skin, however, is desirable for its fiber content.  °BEANS Commercial baked beans with sugar and/or pork added.  °NUTS Avoid nuts.  Limit peanuts and walnuts to one tablespoonful per day.  °BREADS, GRAINS Any baked goods with shortening and/or sugar. Commercial mixes with dried eggs and whole milk. Avoid sweet rolls, doughnuts, breakfast pastries (Danish), and sweetened packaged cereals (the added sugar converts readily to triglycerides).  °MILK PRODUCTS Whole milk and whole-milk packaged goods; cream; ice cream; whole-milk puddings, yogurt, or cheeses; nondairy cream substitutes.  °FATS, OILS Butter, lard, animal fats, bacon drippings, gravies, cream sauces as well as palm and coconut oils. All these are high in saturated fats. Examine labels on cholesterol free products for hydrogenated fats. (These are oils that have been hardened into solids and in the process have become saturated.)  °DESSERTS, SNACKS Fried snack foods like potato chips; chocolate; candies in general; jams, jellies, syrups; whole- milk puddings; ice cream and milk sherbets; hydrogenated peanut butter.  °BEVERAGES Sugared fruit juices and soft drinks; cocoa made with whole milk and/or sugar. When using alcohol (1 oz liquor, 5 oz beer, or 2 ½ oz dry table wine per serving), one serving must be substituted for one bread or cereal serving (limit, two servings of alcohol per day).  ° SPECIAL NOTES  °  Remember that even non-limited foods should be used in moderation. °While on a cholesterol-lowering diet, be sure to avoid animal fats and marbled meats. °3. While on a triglyceride-lowering diet, be sure to avoid sweets and to control the amount of carbohydrates you eat (starchy foods such as flour, bread, potatoes).While on a tri-glyceride-lowering diet, be sure to avoid sweets °Buy a good low-fat cookbook, such as the one published by the American [employer] Association. °Consult your physician  if you have any questions.  ° ° ° ° ° ° ° ° ° ° ° ° ° °Susan Gregory ° ° °Low Glycemic Foods (20-49) Moderate Glycemic Foods (50-69) High Glycemic Foods (70-100)  °    °Breakfast Creals Breakfast Cereals Breakfast Cereals  °All Bran All-Bran Fruit'n Oats  ° Bran Buds Bran Chex  ° Cheerios Corn chex  °  °Fiber One Oatmeal (not instant)  ° Just Right Mini-Wheats  ° Corn Flakes Cream of Wheat  °  °Oat Bran Special K Swiss Muesli  ° Grape Nuts Grape Nut Flakes  °  °  Grits Nutri-Grain  °  °Fruits and fruit juice: Fruits Puffed Rice Puffed Wheat  °  °(Limit to 1-2 Servings per day) Banana (under-ride) Dates  ° Rice Chex Rice Krispies  °  °Apples Apricots (fresh/dried)  ° Figs Grapes  ° Shredded Wheat Team  °  °Blackberries Blueberries  ° Kiwi Mango  ° Total   °  °Cherries Cranberries  ° Oranges Raisins  °   °Peaches Pears  °  Fruits  °Plums Prunes  ° Fruit Juices Pineapple Watermelon  °  °Grapefruit Raspberries  ° Cranberry Juice Orange Juice  ° Banana (over-ripe)   °  °Strawberries Tangerines  °    °  Apple Juice Grapefruit Juice  ° Beans and Legumes Beverages  °Tomato Juice   ° Boston-type baked beans Sodas, sweet tea, pineapple juice  ° Canned pinto, kidney, or navy beans   °Beans and Legumes (fresh-cooked) Green peas Vegetables  °Black-eyed peas Butter Beans  °  Potato, baked, boiled, fried, mashed  °Chick peas Lentils  ° Vegetables French fries  °Green beans Lima beans  ° Beets Carrots  ° Canned or frozen corn  °Kidney beans Navy beans  ° Sweet potato Yam  ° Parsnips  °Pinto beans Snow peas  ° Corn on the cob Winter squash  °    °Non-starchy vegetables Grains Breads  °Asparagus, avocado, broccoli, cabbage Cornmeal Rice, brown  ° Most breads (white and whole grain)  °cauliflower, celery, cucumber, greens Rice, white Couscous  ° Bagels Bread sticks  °  °lettuce, mushrooms, peppers, tomatoes  Bread stuffing Kaiser roll  °  °okra, onions, spinach, summer squash Pasta Dinner rolls  ° Macaroni  Pizza, cheese  °   °Grains Ravioli, meat filled Spaghetti, white  ° Grains  °Barley Bulgur  °  Rice, instant Tapioca, with milk  °  °Rye Wild rice  ° Nuts   ° Cashews Macadamia  ° Candy and most cookies  °Nuts and oils    °Almonds, peanuts, sunflower seeds Snacks Snacks  °hazelnuts, pecans, walnuts Chocolate Ice cream, lowfat  ° Donuts Corn chips  °  °Oils that are liquid at room temperature Muffin Popcorn  ° Jelly beans Pretzels  °  °  Pastries  °Dairy, fish, meat, soy, and eggs    °Milk, skim Lowfat cheese  °  Restaurant and ethnic foods  °Yogurt, lowfat, fruit sugar sweetened  Most Chinese food (sugar in stir fry  °  or wok sauce)  °Lean red meat Fish  °  Teriyaki-style meats and vegetables  °Skinless chicken and turkey, shellfish    °    °Egg whites (up to 3 daily), Soy Products    °Egg yolks (up to 7 or _____ per week)    °  °

## 2020-09-05 ENCOUNTER — Other Ambulatory Visit: Payer: Self-pay

## 2020-09-05 ENCOUNTER — Ambulatory Visit
Admission: RE | Admit: 2020-09-05 | Discharge: 2020-09-05 | Disposition: A | Discharge status: Home / Self Care | Payer: BC Managed Care – PPO | Source: Ambulatory Visit | Attending: Family Medicine | Admitting: Family Medicine

## 2020-09-05 DIAGNOSIS — Z1231 Encounter for screening mammogram for malignant neoplasm of breast: Secondary | ICD-10-CM | POA: Diagnosis not present

## 2020-09-15 ENCOUNTER — Other Ambulatory Visit: Payer: Self-pay | Admitting: Family Medicine

## 2020-09-15 DIAGNOSIS — M79662 Pain in left lower leg: Secondary | ICD-10-CM

## 2020-09-15 DIAGNOSIS — M1712 Unilateral primary osteoarthritis, left knee: Secondary | ICD-10-CM

## 2020-09-15 NOTE — Telephone Encounter (Signed)
Requested medication (s) are due for refill today: yes  Requested medication (s) are on the active medication list: yes  Last refill:  03/27/20  Future visit scheduled: yes  Notes to clinic:  no Hgb since 08/15/2017   Requested Prescriptions  Pending Prescriptions Disp Refills   celecoxib (CELEBREX) 200 MG capsule [Pharmacy Med Name: CELECOXIB 200 MG CAPSULE] 30 capsule 5    Sig: TAKE 1 CAPSULE BY MOUTH EVERY DAY      Analgesics:  COX2 Inhibitors Failed - 09/15/2020  2:56 PM      Failed - HGB in normal range and within 360 days    Hemoglobin  Date Value Ref Range Status  08/15/2017 14.5 12.0 - 16.0 g/dL Final          Passed - Cr in normal range and within 360 days    Creatinine, Ser  Date Value Ref Range Status  07/02/2020 0.80 0.44 - 1.00 mg/dL Final          Passed - Patient is not pregnant      Passed - Valid encounter within last 12 months    Recent Outpatient Visits           2 weeks ago Benign hypertension   [facility] Duanne Limerick, MD   5 months ago Benign hypertension   [facility] Duanne Limerick, MD   11 months ago Benign hypertension   [facility] Duanne Limerick, MD   1 year ago Reactive airway disease, mild intermittent, with acute exacerbation   [facility] Duanne Limerick, MD   1 year ago Benign hypertension   [facility] Duanne Limerick, MD       Future Appointments             In 5 months Duanne Limerick, MD [facility], [facility]

## 2020-10-10 DIAGNOSIS — K2950 Unspecified chronic gastritis without bleeding: Secondary | ICD-10-CM | POA: Diagnosis not present

## 2020-10-10 DIAGNOSIS — K2270 Barrett's esophagus without dysplasia: Secondary | ICD-10-CM | POA: Diagnosis not present

## 2020-11-29 DIAGNOSIS — Z01818 Encounter for other preprocedural examination: Secondary | ICD-10-CM | POA: Diagnosis not present

## 2020-11-29 DIAGNOSIS — D860 Sarcoidosis of lung: Secondary | ICD-10-CM | POA: Diagnosis not present

## 2020-12-02 DIAGNOSIS — H04123 Dry eye syndrome of bilateral lacrimal glands: Secondary | ICD-10-CM | POA: Diagnosis not present

## 2020-12-02 DIAGNOSIS — H35033 Hypertensive retinopathy, bilateral: Secondary | ICD-10-CM | POA: Diagnosis not present

## 2020-12-02 DIAGNOSIS — D869 Sarcoidosis, unspecified: Secondary | ICD-10-CM | POA: Diagnosis not present

## 2020-12-02 DIAGNOSIS — Z961 Presence of intraocular lens: Secondary | ICD-10-CM | POA: Diagnosis not present

## 2020-12-05 DIAGNOSIS — K2270 Barrett's esophagus without dysplasia: Secondary | ICD-10-CM | POA: Diagnosis not present

## 2020-12-05 DIAGNOSIS — K219 Gastro-esophageal reflux disease without esophagitis: Secondary | ICD-10-CM | POA: Diagnosis not present

## 2020-12-05 DIAGNOSIS — K2950 Unspecified chronic gastritis without bleeding: Secondary | ICD-10-CM | POA: Diagnosis not present

## 2020-12-05 DIAGNOSIS — K2970 Gastritis, unspecified, without bleeding: Secondary | ICD-10-CM | POA: Diagnosis not present

## 2020-12-11 ENCOUNTER — Other Ambulatory Visit: Payer: Self-pay | Admitting: Family Medicine

## 2020-12-11 DIAGNOSIS — K219 Gastro-esophageal reflux disease without esophagitis: Secondary | ICD-10-CM

## 2021-02-22 ENCOUNTER — Other Ambulatory Visit: Payer: Self-pay | Admitting: Family Medicine

## 2021-02-22 DIAGNOSIS — E7849 Other hyperlipidemia: Secondary | ICD-10-CM

## 2021-02-22 DIAGNOSIS — I10 Essential (primary) hypertension: Secondary | ICD-10-CM

## 2021-02-22 NOTE — Telephone Encounter (Signed)
Requested Prescriptions  Pending Prescriptions Disp Refills   amLODipine (NORVASC) 10 MG tablet [Pharmacy Med Name: AMLODIPINE BESYLATE 10 MG TAB] 90 tablet 1    Sig: TAKE 1 TABLET BY MOUTH EVERY DAY     Cardiovascular:  Calcium Channel Blockers Passed - 02/22/2021  9:43 AM      Passed - Last BP in normal range    BP Readings from Last 1 Encounters:  08/29/20 130/62         Passed - Valid encounter within last 6 months    Recent Outpatient Visits          5 months ago Benign hypertension   [facility] Juline Patch, MD   11 months ago Benign hypertension   [facility] Juline Patch, MD   1 year ago Benign hypertension   [facility] Juline Patch, MD   1 year ago Reactive airway disease, mild intermittent, with acute exacerbation   [facility] Juline Patch, MD   2 years ago Benign hypertension   [facility] Juline Patch, MD      Future Appointments            In 1 week Juline Patch, MD [facility]            hydrochlorothiazide (HYDRODIURIL) 50 MG tablet [Pharmacy Med Name: HYDROCHLOROTHIAZIDE 50 MG TAB] 90 tablet 1    Sig: TAKE 1 TABLET BY MOUTH EVERY DAY     Cardiovascular: Diuretics - Thiazide Passed - 02/22/2021  9:43 AM      Passed - Ca in normal range and within 360 days    Calcium  Date Value Ref Range Status  03/27/2020 10.0 8.7 - 10.3 mg/dL Final         Passed - Cr in normal range and within 360 days    Creatinine, Ser  Date Value Ref Range Status  07/02/2020 0.80 0.44 - 1.00 mg/dL Final         Passed - K in normal range and within 360 days    Potassium  Date Value Ref Range Status  03/27/2020 4.0 3.5 - 5.2 mmol/L Final         Passed - Na in normal range and within 360 days    Sodium  Date Value Ref Range Status  03/27/2020 143 134 - 144 mmol/L Final         Passed - Last BP in normal range    BP Readings from Last 1 Encounters:  08/29/20 130/62          Passed - Valid encounter within last 6 months    Recent Outpatient Visits          5 months ago Benign hypertension   [facility] Juline Patch, MD   11 months ago Benign hypertension   [facility] Juline Patch, MD   1 year ago Benign hypertension   [facility] Juline Patch, MD   1 year ago Reactive airway disease, mild intermittent, with acute exacerbation   [facility] Juline Patch, MD   2 years ago Benign hypertension   [place], [place]      Future Appointments            In 1 week Juline Patch, MD [facility], [facility]            simvastatin (ZOCOR) 20 MG tablet [  Pharmacy Med Name: SIMVASTATIN 20 MG TABLET] 90 tablet 1    Sig: TAKE 1 TABLET BY MOUTH EVERY DAY     Cardiovascular:  Antilipid - Statins Failed - 02/22/2021  9:43 AM      Failed - Total Cholesterol in normal range and within 360 days    Cholesterol, Total  Date Value Ref Range Status  03/27/2020 203 (H) 100 - 199 mg/dL Final         Failed - LDL in normal range and within 360 days    LDL Chol Calc (NIH)  Date Value Ref Range Status  03/27/2020 123 (H) 0 - 99 mg/dL Final         Passed - HDL in normal range and within 360 days    HDL  Date Value Ref Range Status  03/27/2020 58 >39 mg/dL Final         Passed - Triglycerides in normal range and within 360 days    Triglycerides  Date Value Ref Range Status  03/27/2020 126 0 - 149 mg/dL Final         Passed - Patient is not pregnant      Passed - Valid encounter within last 12 months    Recent Outpatient Visits          5 months ago Benign hypertension   [facility], Deanna C, MD   11 months ago Benign hypertension   [place], Deanna C, MD   1 year ago Benign hypertension   [facility] Juline Patch, MD   1 year ago Reactive airway disease, mild intermittent, with acute exacerbation   [facility] Juline Patch, MD   2 years ago Benign hypertension   [place], Deanna C, MD      Future Appointments            In 1 week Juline Patch, MD [facility], [facility]

## 2021-03-03 ENCOUNTER — Ambulatory Visit: Payer: Self-pay | Admitting: Family Medicine

## 2021-03-04 ENCOUNTER — Other Ambulatory Visit: Payer: Self-pay

## 2021-03-04 ENCOUNTER — Encounter: Payer: Self-pay | Admitting: Family Medicine

## 2021-03-04 ENCOUNTER — Ambulatory Visit (INDEPENDENT_AMBULATORY_CARE_PROVIDER_SITE_OTHER): Payer: BC Managed Care – PPO | Admitting: Family Medicine

## 2021-03-04 VITALS — BP 106/72 | HR 76 | Ht 64.0 in | Wt 210.0 lb

## 2021-03-04 DIAGNOSIS — E7849 Other hyperlipidemia: Secondary | ICD-10-CM | POA: Diagnosis not present

## 2021-03-04 DIAGNOSIS — I10 Essential (primary) hypertension: Secondary | ICD-10-CM | POA: Diagnosis not present

## 2021-03-04 MED ORDER — LOSARTAN POTASSIUM 25 MG PO TABS: 25.0000 mg | ORAL_TABLET | Freq: Two times a day (BID) | ORAL | 1 refills | Status: DC

## 2021-03-04 MED ORDER — HYDROCHLOROTHIAZIDE 50 MG PO TABS: 50.0000 mg | ORAL_TABLET | Freq: Every day | ORAL | 1 refills | Status: DC

## 2021-03-04 MED ORDER — AMLODIPINE BESYLATE 10 MG PO TABS: 10.0000 mg | ORAL_TABLET | Freq: Every day | ORAL | 1 refills | Status: DC

## 2021-03-04 MED ORDER — SIMVASTATIN 20 MG PO TABS: 20.0000 mg | ORAL_TABLET | Freq: Every day | ORAL | 1 refills | Status: DC

## 2021-03-04 NOTE — Progress Notes (Signed)
Date:  03/04/2021   Name:  Susan Gregory   DOB:  27-Apr-1960   MRN:  [id number]   Chief Complaint: Hypertension and Hyperlipidemia  Hypertension This is a chronic problem. The current episode started more than 1 year ago. The problem has been gradually improving since onset. The problem is controlled. Pertinent negatives include no anxiety, blurred vision, chest pain, headaches, malaise/fatigue, neck pain, orthopnea, palpitations, peripheral edema, PND, shortness of breath or sweats. There are no associated agents to hypertension. Risk factors for coronary artery disease include dyslipidemia. Past treatments include angiotensin blockers, calcium channel blockers and diuretics. The current treatment provides moderate improvement. There are no compliance problems.  There is no history of angina, kidney disease, CAD/MI, CVA, heart failure, left ventricular hypertrophy, PVD or retinopathy. There is no history of chronic renal disease, a hypertension causing med or renovascular disease.  Hyperlipidemia This is a chronic problem. The current episode started more than 1 year ago. The problem is controlled. Recent lipid tests were reviewed and are normal. She has no history of chronic renal disease, diabetes, hypothyroidism, liver disease, obesity or nephrotic syndrome. There are no known factors aggravating her hyperlipidemia. Pertinent negatives include no chest pain, focal sensory loss, focal weakness, leg pain, myalgias or shortness of breath. Current antihyperlipidemic treatment includes statins. The current treatment provides moderate improvement of lipids. There are no compliance problems.  Risk factors for coronary artery disease include hypertension and dyslipidemia.   Lab Results  Component Value Date   NA 143 03/27/2020   K 4.0 03/27/2020   CO2 27 03/27/2020   GLUCOSE 87 03/27/2020   BUN 12 03/27/2020   CREATININE 0.80 07/02/2020   CALCIUM 10.0 03/27/2020   GFRNONAA 73 03/27/2020   Lab  Results  Component Value Date   CHOL 203 (H) 03/27/2020   HDL 58 03/27/2020   LDLCALC 123 (H) 03/27/2020   TRIG 126 03/27/2020   CHOLHDL 3.9 09/26/2019   No results found for: TSH No results found for: HGBA1C Lab Results  Component Value Date   WBC 3.5 (L) 08/15/2017   HGB 14.5 08/15/2017   HCT 42.2 08/15/2017   MCV 86.6 08/15/2017   PLT 181 08/15/2017   Lab Results  Component Value Date   ALT 23 03/27/2020   AST 22 03/27/2020   ALKPHOS 75 03/27/2020   BILITOT 0.4 03/27/2020   No results found for: 25OHVITD2, 25OHVITD3, VD25OH   Review of Systems  Constitutional:  Negative for chills, fever and malaise/fatigue.  HENT:  Negative for drooling, ear discharge, ear pain and sore throat.   Eyes:  Negative for blurred vision.  Respiratory:  Negative for cough, shortness of breath and wheezing.   Cardiovascular:  Negative for chest pain, palpitations, orthopnea, leg swelling and PND.  Gastrointestinal:  Negative for abdominal pain, blood in stool, constipation, diarrhea and nausea.  Endocrine: Negative for polydipsia.  Genitourinary:  Negative for dysuria, frequency, hematuria and urgency.  Musculoskeletal:  Negative for back pain, myalgias and neck pain.  Skin:  Negative for rash.  Allergic/Immunologic: Negative for environmental allergies.  Neurological:  Negative for dizziness, focal weakness and headaches.  Hematological:  Does not bruise/bleed easily.  Psychiatric/Behavioral:  Negative for suicidal ideas. The patient is not nervous/anxious.    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 Coil marker neg   BREAST BIOPSY Right 09/07/2016   stereo biopsy. Ribbon marker neg   COLONOSCOPY     COLONOSCOPY WITH PROPOFOL N/A 11/18/2018   Procedure: COLONOSCOPY WITH PROPOFOL;  Surgeon: Lollie Sails, MD;  Location: [facility] ENDOSCOPY;  Service: Endoscopy;  Laterality: N/A;   ESOPHAGOGASTRODUODENOSCOPY (EGD) WITH PROPOFOL N/A 04/20/2016   Procedure: ESOPHAGOGASTRODUODENOSCOPY (EGD) WITH PROPOFOL;  Surgeon: Lollie Sails, MD;  Location: [facility] ENDOSCOPY;  Service: Endoscopy;  Laterality: N/A;   ESOPHAGOGASTRODUODENOSCOPY (EGD) WITH PROPOFOL N/A 11/18/2018   Procedure: ESOPHAGOGASTRODUODENOSCOPY (EGD) WITH PROPOFOL;  Surgeon: Lollie Sails, MD;  Location: [facility] ENDOSCOPY;  Service: Endoscopy;  Laterality: N/A;   THROAT SURGERY     nodules taken off throat   TUBAL LIGATION      Social History   Tobacco Use   Smoking status: Never   Smokeless tobacco: Never  Vaping Use   Vaping Use: Never used  Substance Use Topics   Alcohol use: No    Alcohol/week: 0.0 standard drinks   Drug use: No     Medication list has been reviewed and updated.  Current Meds  Medication Sig   acetaminophen (TYLENOL) 500 MG tablet Take 1,000 mg by mouth every 6 (six) hours as needed for moderate pain or headache.   albuterol (VENTOLIN HFA) 108 (90 Base) MCG/ACT inhaler INHALE 2 PUFFS EVERY 6 HOURS AS NEEDED FOR WHEEZING   amLODipine (NORVASC) 10 MG tablet TAKE 1 TABLET BY MOUTH EVERY DAY   aspirin 81 MG tablet Take 81 mg by mouth daily.    Carboxymethylcellul-Glycerin (CLEAR EYES FOR DRY EYES OP) Place 1 drop into both eyes daily as needed (dry  eyes).   clobetasol (TEMOVATE) 0.05 % external solution APPLY TO AFFECTED AREA TWICE A DAY   diphenhydrAMINE HCl, Sleep, 50 MG CAPS Take 50 mg by mouth at bedtime as needed (sleep). "jet sleep" otc sleep aid (50 mg)   griseofulvin (GRIFULVIN V) 500 MG tablet Take 500 mg by mouth 2 (two) times daily as needed (fungus). Dr Phillip Heal   hydrochlorothiazide (HYDRODIURIL) 50 MG tablet TAKE 1 TABLET BY MOUTH EVERY DAY   ketoconazole (NIZORAL) 2 % shampoo Apply 1 application topically daily as needed (fungus).   losartan (COZAAR) 25 MG tablet Take 1 tablet (25 mg total) by mouth in the morning and at bedtime.   Multiple Vitamin (MULTIVITAMIN WITH MINERALS) TABS tablet Take 1 tablet by mouth 2 (two) times a week.   pantoprazole (PROTONIX) 40 MG tablet TAKE 1 TABLET (40 MG TOTAL) BY MOUTH DAILY. DR Gustavo Lah   simvastatin (ZOCOR) 20 MG tablet TAKE 1 TABLET BY MOUTH EVERY DAY   [DISCONTINUED] celecoxib (CELEBREX) 200 MG capsule TAKE 1 CAPSULE BY MOUTH EVERY DAY    PHQ 2/9 Scores 03/04/2021 08/29/2020 03/27/2020 03/29/2019  PHQ - 2 Score 0 0 0 0  PHQ- 9 Score 0 0 0 1    GAD 7 : Generalized Anxiety Score 03/04/2021 08/29/2020 03/27/2020 03/29/2019  Nervous, Anxious, on Edge 0 0 0 0  Control/stop  worrying 0 0 0 0  Worry too much - different things 0 0 0 0  Trouble relaxing 0 0 0 0  Restless 0 0 0 0  Easily annoyed or irritable 0 0 0 0  Afraid - awful might happen 0 0 0 0  Total GAD 7 Score 0 0 0 0  Anxiety Difficulty Not difficult at all - - -    BP Readings from Last 3 Encounters:  03/04/21 106/72  08/29/20 130/62  03/27/20 130/88    Physical Exam Vitals and nursing note reviewed.  Constitutional:      Appearance: She is well-developed.  HENT:     Head: Normocephalic.     Right Ear: Tympanic membrane, ear canal and external ear normal.     Left Ear: Tympanic membrane, ear canal and external ear normal.  Eyes:     General: Lids are everted, no foreign bodies appreciated. No scleral icterus.        Left eye: No foreign body or hordeolum.     Conjunctiva/sclera: Conjunctivae normal.     Right eye: Right conjunctiva is not injected.     Left eye: Left conjunctiva is not injected.     Pupils: Pupils are equal, round, and reactive to light.  Neck:     Thyroid: No thyromegaly.     Vascular: No JVD.     Trachea: No tracheal deviation.  Cardiovascular:     Rate and Rhythm: Normal rate and regular rhythm.     Heart sounds: Normal heart sounds, S1 normal and S2 normal. No murmur heard. No systolic murmur is present.  No diastolic murmur is present.    No friction rub. No gallop. No S3 or S4 sounds.  Pulmonary:     Effort: Pulmonary effort is normal. No respiratory distress.     Breath sounds: Normal breath sounds. No wheezing, rhonchi or rales.  Abdominal:     General: Bowel sounds are normal.     Palpations: Abdomen is soft. There is no mass.     Tenderness: There is no abdominal tenderness. There is no guarding or rebound.  Musculoskeletal:        General: No tenderness. Normal range of motion.     Cervical back: Normal range of motion and neck supple.  Lymphadenopathy:     Cervical: No cervical adenopathy.  Skin:    General: Skin is warm.     Findings: No rash.  Neurological:     Mental Status: She is alert and oriented to person, place, and time.     Cranial Nerves: No cranial nerve deficit.     Deep Tendon Reflexes: Reflexes normal.  Psychiatric:        Mood and Affect: Mood is not anxious or depressed.    Wt Readings from Last 3 Encounters:  03/04/21 210 lb (95.3 kg)  08/29/20 204 lb (92.5 kg)  03/27/20 209 lb (94.8 kg)    BP 106/72    Pulse 76    Ht 5\' 4"  (1.626 m)    Wt 210 lb (95.3 kg)    LMP  (LMP Unknown) Comment: menopausal, denies preg   BMI 36.05 kg/m   Assessment and Plan:  1. Benign hypertension Chronic.  Controlled.  Stable.  Blood pressure today is 106/72.  We will continue triple therapy with hydrochlorothiazide 50 mg once a day, amlodipine 10 mg once  a day, and losartan 25 mg once a day.  Review of last renal function panel was acceptable. - hydrochlorothiazide (HYDRODIURIL) 50  MG tablet; Take 1 tablet (50 mg total) by mouth daily.  Dispense: 90 tablet; Refill: 1 - amLODipine (NORVASC) 10 MG tablet; Take 1 tablet (10 mg total) by mouth daily.  Dispense: 90 tablet; Refill: 1 - losartan (COZAAR) 25 MG tablet; Take 1 tablet (25 mg total) by mouth in the morning and at bedtime.  Dispense: 180 tablet; Refill: 1  2. Familial multiple lipoprotein-type hyperlipidemia Chronic.  Controlled.  Stable.  Continue simvastatin 20 mg once a day.  Will check lipid panel for current level of LDL control. - simvastatin (ZOCOR) 20 MG tablet; Take 1 tablet (20 mg total) by mouth daily.  Dispense: 90 tablet; Refill: 1 - Lipid Panel With LDL/HDL Ratio

## 2021-03-05 LAB — LIPID PANEL WITH LDL/HDL RATIO
Cholesterol, Total: 178 mg/dL (ref 100–199)
HDL: 54 mg/dL (ref >39)
LDL Chol Calc (NIH): 111 mg/dL — ABNORMAL HIGH (ref 0–99)
LDL/HDL Ratio: 2.1 ratio (ref 0.0–3.2)
Triglycerides: 71 mg/dL (ref 0–149)
VLDL Cholesterol Cal: 13 mg/dL (ref 5–40)

## 2021-03-10 ENCOUNTER — Other Ambulatory Visit: Payer: Self-pay | Admitting: Family Medicine

## 2021-03-10 DIAGNOSIS — I10 Essential (primary) hypertension: Secondary | ICD-10-CM

## 2021-03-27 ENCOUNTER — Other Ambulatory Visit: Payer: Self-pay

## 2021-03-27 ENCOUNTER — Encounter: Payer: Self-pay | Admitting: Family Medicine

## 2021-03-27 ENCOUNTER — Ambulatory Visit (INDEPENDENT_AMBULATORY_CARE_PROVIDER_SITE_OTHER): Payer: BC Managed Care – PPO | Admitting: Family Medicine

## 2021-03-27 VITALS — BP 136/76 | HR 82 | Temp 98.2°F | Ht 64.0 in | Wt 216.0 lb

## 2021-03-27 DIAGNOSIS — H6982 Other specified disorders of Eustachian tube, left ear: Secondary | ICD-10-CM

## 2021-03-27 DIAGNOSIS — J4521 Mild intermittent asthma with (acute) exacerbation: Secondary | ICD-10-CM

## 2021-03-27 DIAGNOSIS — J0100 Acute maxillary sinusitis, unspecified: Secondary | ICD-10-CM

## 2021-03-27 MED ORDER — AMOXICILLIN 500 MG PO CAPS: 500.0000 mg | ORAL_CAPSULE | Freq: Three times a day (TID) | ORAL | 0 refills | Status: AC

## 2021-03-27 MED ORDER — TRIAMCINOLONE ACETONIDE 55 MCG/ACT NA AERO: 2.0000 | INHALATION_SPRAY | Freq: Every day | NASAL | 12 refills | Status: DC

## 2021-03-27 MED ORDER — MONTELUKAST SODIUM 10 MG PO TABS: 10.0000 mg | ORAL_TABLET | Freq: Every day | ORAL | 3 refills | Status: DC

## 2021-03-27 NOTE — Progress Notes (Signed)
Date:  03/27/2021   Name:  Susan Gregory   DOB:  03/30/1960   MRN:  [id number]   Chief Complaint: Sinusitis Nyoka Cowden production, no cough)  Sinusitis This is a new problem. The current episode started in the past 7 days. The problem has been waxing and waning since onset. There has been no fever. The fever has been present for 3 to 4 days. She is experiencing no pain. Associated symptoms include congestion and sinus pressure. Pertinent negatives include no chills, coughing, diaphoresis, ear pain, headaches, hoarse voice, neck pain, shortness of breath, sneezing, sore throat or swollen glands. The treatment provided moderate relief.  Asthma She complains of chest tightness, frequent throat clearing, sputum production and wheezing. There is no cough, hoarse voice or shortness of breath. This is a chronic problem. The current episode started in the past 7 days. The problem occurs intermittently. Associated symptoms include postnasal drip. Pertinent negatives include no chest pain, ear pain, fever, headaches, myalgias, sneezing or sore throat. Her past medical history is significant for asthma.   Lab Results  Component Value Date   NA 143 03/27/2020   K 4.0 03/27/2020   CO2 27 03/27/2020   GLUCOSE 87 03/27/2020   BUN 12 03/27/2020   CREATININE 0.80 07/02/2020   CALCIUM 10.0 03/27/2020   GFRNONAA 73 03/27/2020   Lab Results  Component Value Date   CHOL 178 03/04/2021   HDL 54 03/04/2021   LDLCALC 111 (H) 03/04/2021   TRIG 71 03/04/2021   CHOLHDL 3.9 09/26/2019   No results found for: TSH No results found for: HGBA1C Lab Results  Component Value Date   WBC 3.5 (L) 08/15/2017   HGB 14.5 08/15/2017   HCT 42.2 08/15/2017   MCV 86.6 08/15/2017   PLT 181 08/15/2017   Lab Results  Component Value Date   ALT 23 03/27/2020   AST 22 03/27/2020   ALKPHOS 75 03/27/2020   BILITOT 0.4 03/27/2020   No results found for: 25OHVITD2, 25OHVITD3, VD25OH   Review of Systems  Constitutional:   Negative for chills, diaphoresis and fever.  HENT:  Positive for congestion, postnasal drip and sinus pressure. Negative for drooling, ear discharge, ear pain, hoarse voice, sneezing and sore throat.   Respiratory:  Positive for sputum production and wheezing. Negative for cough and shortness of breath.   Cardiovascular:  Negative for chest pain, palpitations and leg swelling.  Gastrointestinal:  Negative for abdominal pain, blood in stool, constipation, diarrhea and nausea.  Endocrine: Negative for polydipsia.  Genitourinary:  Negative for dysuria, frequency, hematuria and urgency.  Musculoskeletal:  Negative for back pain, myalgias and neck pain.  Skin:  Negative for rash.  Allergic/Immunologic: Negative for environmental allergies.  Neurological:  Negative for dizziness and headaches.  Hematological:  Does not bruise/bleed easily.  Psychiatric/Behavioral:  Negative for suicidal ideas. The patient is not nervous/anxious.    Patient Active Problem List   Diagnosis Date Noted   Gastroesophageal reflux disease 08/29/2020   BMI 35.0-35.9,adult 08/29/2020   Familial multiple lipoprotein-type hyperlipidemia 07/18/2014   Disorder of lipoprotein and lipid metabolism 07/18/2014   Benign hypertension 07/18/2014   Acid reflux 07/18/2014   Encounter for general adult medical examination without abnormal findings 07/18/2014   Neck sprain and strain 07/18/2014   Abnormal breast finding 07/18/2014   Abnormal finding on breast imaging 07/18/2014   Arthritis of knee, degenerative 07/18/2014   TB skin/subcutaneous 07/18/2014   Fecal occult blood test positive 11/01/2013    No Known Allergies  Past Surgical History:  Procedure Laterality Date   BREAST BIOPSY Right 09/04/2016   US guided right breast biopsy. Coil marker neg   BREAST BIOPSY Right 09/07/2016   stereo biopsy. Ribbon marker neg   COLONOSCOPY     COLONOSCOPY WITH PROPOFOL N/A 11/18/2018   Procedure: COLONOSCOPY WITH PROPOFOL;   Surgeon: Lollie Sails, MD;  Location: [facility] ENDOSCOPY;  Service: Endoscopy;  Laterality: N/A;   ESOPHAGOGASTRODUODENOSCOPY (EGD) WITH PROPOFOL N/A 04/20/2016   Procedure: ESOPHAGOGASTRODUODENOSCOPY (EGD) WITH PROPOFOL;  Surgeon: Lollie Sails, MD;  Location: [facility] ENDOSCOPY;  Service: Endoscopy;  Laterality: N/A;   ESOPHAGOGASTRODUODENOSCOPY (EGD) WITH PROPOFOL N/A 11/18/2018   Procedure: ESOPHAGOGASTRODUODENOSCOPY (EGD) WITH PROPOFOL;  Surgeon: Lollie Sails, MD;  Location: [facility] ENDOSCOPY;  Service: Endoscopy;  Laterality: N/A;   THROAT SURGERY     nodules taken off throat   TUBAL LIGATION      Social History   Tobacco Use   Smoking status: Never   Smokeless tobacco: Never  Vaping Use   Vaping Use: Never used  Substance Use Topics   Alcohol use: No    Alcohol/week: 0.0 standard drinks   Drug use: No     Medication list has been reviewed and updated.  Current Meds  Medication Sig   acetaminophen (TYLENOL) 500 MG tablet Take 1,000 mg by mouth every 6 (six) hours as needed for moderate pain or headache.   albuterol (VENTOLIN HFA) 108 (90 Base) MCG/ACT inhaler INHALE 2 PUFFS EVERY 6 HOURS AS NEEDED FOR WHEEZING   amLODipine (NORVASC) 10 MG tablet Take 1 tablet (10 mg total) by mouth daily.   aspirin 81 MG tablet Take 81 mg by mouth daily.    Carboxymethylcellul-Glycerin (CLEAR EYES FOR DRY EYES OP) Place 1 drop into both eyes daily as needed (dry eyes).   clobetasol (TEMOVATE) 0.05 % external solution APPLY TO AFFECTED AREA TWICE A DAY   diphenhydrAMINE HCl, Sleep, 50 MG CAPS Take 50 mg by mouth at bedtime as needed (sleep). "jet sleep" otc sleep aid (50 mg)   folic acid (FOLVITE) 1 MG tablet Take 1 mg by mouth daily. rheumatology   griseofulvin (GRIFULVIN V) 500 MG tablet Take 500 mg by mouth 2 (two) times daily as needed (fungus). Dr Phillip Heal   hydrochlorothiazide (HYDRODIURIL) 50 MG tablet Take 1 tablet (50 mg total) by mouth daily.   ketoconazole (NIZORAL) 2 % shampoo  Apply 1 application topically daily as needed (fungus).   losartan (COZAAR) 25 MG tablet TAKE 1 TABLET (25 MG TOTAL) BY MOUTH IN THE MORNING AND AT BEDTIME.   methotrexate (RHEUMATREX) 2.5 MG tablet Take 10 mg by mouth once a week. Rheumatology   Multiple Vitamin (MULTIVITAMIN WITH MINERALS) TABS tablet Take 1 tablet by mouth 2 (two) times a week.   pantoprazole (PROTONIX) 40 MG tablet TAKE 1 TABLET (40 MG TOTAL) BY MOUTH DAILY. DR Gustavo Lah   simvastatin (ZOCOR) 20 MG tablet Take 1 tablet (20 mg total) by mouth daily.    PHQ 2/9 Scores 03/04/2021 08/29/2020 03/27/2020 03/29/2019  PHQ - 2 Score 0 0 0 0  PHQ- 9 Score 0 0 0 1    GAD 7 : Generalized Anxiety Score 03/04/2021 08/29/2020 03/27/2020 03/29/2019  Nervous, Anxious, on Edge 0 0 0 0  Control/stop worrying 0 0 0 0  Worry too much - different things 0 0 0 0  Trouble relaxing 0 0 0 0  Restless 0 0 0 0  Easily annoyed or irritable 0 0 0 0  Afraid -  awful might happen 0 0 0 0  Total GAD 7 Score 0 0 0 0  Anxiety Difficulty Not difficult at all - - -    BP Readings from Last 3 Encounters:  03/27/21 136/76  03/04/21 106/72  08/29/20 130/62    Physical Exam Vitals and nursing note reviewed.  Constitutional:      Appearance: She is well-developed.  HENT:     Head: Normocephalic.     Right Ear: Tympanic membrane, ear canal and external ear normal. There is no impacted cerumen.     Left Ear: Tympanic membrane, ear canal and external ear normal. There is no impacted cerumen.     Nose: Nose normal.  Eyes:     General: Lids are everted, no foreign bodies appreciated. No scleral icterus.       Left eye: No foreign body or hordeolum.     Conjunctiva/sclera: Conjunctivae normal.     Right eye: Right conjunctiva is not injected.     Left eye: Left conjunctiva is not injected.     Pupils: Pupils are equal, round, and reactive to light.  Neck:     Thyroid: No thyromegaly.     Vascular: No JVD.     Trachea: No tracheal deviation.   Cardiovascular:     Rate and Rhythm: Normal rate and regular rhythm.     Heart sounds: Normal heart sounds. No murmur heard.   No friction rub. No gallop.  Pulmonary:     Effort: Pulmonary effort is normal. No respiratory distress.     Breath sounds: Normal breath sounds. No wheezing, rhonchi or rales.  Abdominal:     General: Bowel sounds are normal.     Palpations: Abdomen is soft. There is no mass.     Tenderness: There is no abdominal tenderness. There is no guarding or rebound.  Musculoskeletal:        General: No tenderness. Normal range of motion.     Cervical back: Normal range of motion and neck supple.  Lymphadenopathy:     Cervical: No cervical adenopathy.  Skin:    General: Skin is warm.     Findings: No rash.  Neurological:     Mental Status: She is alert and oriented to person, place, and time.     Cranial Nerves: No cranial nerve deficit.     Deep Tendon Reflexes: Reflexes normal.  Psychiatric:        Mood and Affect: Mood is not anxious or depressed.    Wt Readings from Last 3 Encounters:  03/27/21 216 lb (98 kg)  03/04/21 210 lb (95.3 kg)  08/29/20 204 lb (92.5 kg)    BP 136/76    Pulse 82    Temp 98.2 F (36.8 C) (Oral)    Ht 5\' 4"  (1.626 m)    Wt 216 lb (98 kg)    LMP  (LMP Unknown) Comment: menopausal, denies preg   SpO2 98%    BMI 37.08 kg/m   Assessment and Plan:  1. Acute maxillary sinusitis, recurrence not specified New onset.  Persistent.  Stable.  Continue Nasacort 2 sprays in each nostril daily amoxicillin 500 mg 3 times a day and will initiate Singulair 10 mg once a day for allergic rhinitis and reactive airway disease. - triamcinolone (NASACORT) 55 MCG/ACT AERO nasal inhaler; Place 2 sprays into the nose daily.  Dispense: 1 each; Refill: 12 - amoxicillin (AMOXIL) 500 MG capsule; Take 1 capsule (500 mg total) by mouth 3 (three) times daily for 10  days.  Dispense: 30 capsule; Refill: 0 - montelukast (SINGULAIR) 10 MG tablet; Take 1 tablet (10 mg  total) by mouth at bedtime.  Dispense: 30 tablet; Refill: 3  2. Reactive airway disease, mild intermittent, with acute exacerbation Chronic.  Episodic.  Stable.  This is intermittent in nature and mild in course of disease.  Currently patient is on beta agonist inhaler.  Which she has been on 1 to 2 puffs every 6 hours as needed wheezing. - montelukast (SINGULAIR) 10 MG tablet; Take 1 tablet (10 mg total) by mouth at bedtime.  Dispense: 30 tablet; Refill: 3  3. Eustachian tube dysfunction, left New onset.  Episodic.  Relatively stable.  Patient has noted that this has been more frequent in nature over the past weeks which would indicate the combination of the increased pollen count from the tree pollen release as well as the multiple weather fronts that have been coming through lately.  This causes a inability to clear her ears.  We will initiate Nasacort nasal spray as well as Singulair 10 mg once a day. - triamcinolone (NASACORT) 55 MCG/ACT AERO nasal inhaler; Place 2 sprays into the nose daily.  Dispense: 1 each; Refill: 12 - montelukast (SINGULAIR) 10 MG tablet; Take 1 tablet (10 mg total) by mouth at bedtime.  Dispense: 30 tablet; Refill: 3

## 2021-04-15 ENCOUNTER — Ambulatory Visit: Payer: Self-pay

## 2021-04-15 ENCOUNTER — Other Ambulatory Visit: Payer: Self-pay

## 2021-04-15 ENCOUNTER — Ambulatory Visit (INDEPENDENT_AMBULATORY_CARE_PROVIDER_SITE_OTHER): Payer: BC Managed Care – PPO | Admitting: Family Medicine

## 2021-04-15 ENCOUNTER — Ambulatory Visit
Admission: RE | Admit: 2021-04-15 | Discharge: 2021-04-15 | Disposition: A | Discharge status: Home / Self Care | Payer: BC Managed Care – PPO | Source: Ambulatory Visit | Attending: Family Medicine | Admitting: Family Medicine

## 2021-04-15 ENCOUNTER — Ambulatory Visit
Admission: RE | Admit: 2021-04-15 | Discharge: 2021-04-15 | Disposition: A | Discharge status: Home / Self Care | Payer: BC Managed Care – PPO | Attending: Family Medicine | Admitting: Family Medicine

## 2021-04-15 ENCOUNTER — Encounter: Payer: Self-pay | Admitting: Family Medicine

## 2021-04-15 VITALS — BP 120/60 | HR 79 | Temp 98.4°F | Ht 64.0 in | Wt 213.0 lb

## 2021-04-15 DIAGNOSIS — J0100 Acute maxillary sinusitis, unspecified: Secondary | ICD-10-CM | POA: Diagnosis not present

## 2021-04-15 DIAGNOSIS — J479 Bronchiectasis, uncomplicated: Secondary | ICD-10-CM | POA: Diagnosis not present

## 2021-04-15 DIAGNOSIS — R051 Acute cough: Secondary | ICD-10-CM

## 2021-04-15 DIAGNOSIS — R059 Cough, unspecified: Secondary | ICD-10-CM | POA: Diagnosis not present

## 2021-04-15 MED ORDER — AZITHROMYCIN 250 MG PO TABS: ORAL_TABLET | ORAL | 0 refills | Status: AC

## 2021-04-15 NOTE — Progress Notes (Signed)
Date:  04/15/2021   Name:  Susan Gregory   DOB:  06-26-1960   MRN:  [id number]   Chief Complaint: Cough (X 1 week, mucous light green, no fever, sneezing, no headache, no covid test, Nyquil didn't help / )  Cough This is a new problem. The current episode started in the past 7 days. The problem has been waxing and waning. The problem occurs every few minutes. The cough is Productive of purulent sputum. Associated symptoms include nasal congestion, postnasal drip and wheezing. Pertinent negatives include no chills, ear pain, hemoptysis, myalgias, sore throat or shortness of breath.   Lab Results  Component Value Date   NA 143 03/27/2020   K 4.0 03/27/2020   CO2 27 03/27/2020   GLUCOSE 87 03/27/2020   BUN 12 03/27/2020   CREATININE 0.80 07/02/2020   CALCIUM 10.0 03/27/2020   GFRNONAA 73 03/27/2020   Lab Results  Component Value Date   CHOL 178 03/04/2021   HDL 54 03/04/2021   LDLCALC 111 (H) 03/04/2021   TRIG 71 03/04/2021   CHOLHDL 3.9 09/26/2019   No results found for: TSH No results found for: HGBA1C Lab Results  Component Value Date   WBC 3.5 (L) 08/15/2017   HGB 14.5 08/15/2017   HCT 42.2 08/15/2017   MCV 86.6 08/15/2017   PLT 181 08/15/2017   Lab Results  Component Value Date   ALT 23 03/27/2020   AST 22 03/27/2020   ALKPHOS 75 03/27/2020   BILITOT 0.4 03/27/2020   No results found for: 25OHVITD2, 25OHVITD3, VD25OH   Review of Systems  Constitutional:  Negative for chills.  HENT:  Positive for postnasal drip. Negative for ear pain and sore throat.   Respiratory:  Positive for cough and wheezing. Negative for hemoptysis and shortness of breath.   Musculoskeletal:  Negative for myalgias.   Patient Active Problem List   Diagnosis Date Noted   Gastroesophageal reflux disease 08/29/2020   BMI 35.0-35.9,adult 08/29/2020   Familial multiple lipoprotein-type hyperlipidemia 07/18/2014   Disorder of lipoprotein and lipid metabolism 07/18/2014   Benign  hypertension 07/18/2014   Acid reflux 07/18/2014   Encounter for general adult medical examination without abnormal findings 07/18/2014   Neck sprain and strain 07/18/2014   Abnormal breast finding 07/18/2014   Abnormal finding on breast imaging 07/18/2014   Arthritis of knee, degenerative 07/18/2014   TB skin/subcutaneous 07/18/2014   Fecal occult blood test positive 11/01/2013    No Known Allergies  Past Surgical History:  Procedure Laterality Date   BREAST BIOPSY Right 09/04/2016   US guided right breast biopsy. Coil marker neg   BREAST BIOPSY Right 09/07/2016   stereo biopsy. Ribbon marker neg   COLONOSCOPY     COLONOSCOPY WITH PROPOFOL N/A 11/18/2018   Procedure: COLONOSCOPY WITH PROPOFOL;  Surgeon: Christena Deem, MD;  Location: [facility] ENDOSCOPY;  Service: Endoscopy;  Laterality: N/A;   ESOPHAGOGASTRODUODENOSCOPY (EGD) WITH PROPOFOL N/A 04/20/2016   Procedure: ESOPHAGOGASTRODUODENOSCOPY (EGD) WITH PROPOFOL;  Surgeon: Christena Deem, MD;  Location[facility] ENDOSCOPY;  Service: Endoscopy;  Laterality: N/A;   ESOPHAGOGASTRODUODENOSCOPY (EGD) WITH PROPOFOL N/A 11/18/2018   Procedure: ESOPHAGOGASTRODUODENOSCOPY (EGD) WITH PROPOFOL;  Surgeon: Christena Deem, MD;  Location: [facility] ENDOSCOPY;  Service: Endoscopy;  Laterality: N/A;   THROAT SURGERY     nodules taken off throat   TUBAL LIGATION      Social History   Tobacco Use   Smoking status: Never   Smokeless tobacco: Never  Vaping Use  Vaping Use: Never used  Substance Use Topics   Alcohol use: No    Alcohol/week: 0.0 standard drinks   Drug use: No     Medication list has been reviewed and updated.  Current Meds  Medication Sig   acetaminophen (TYLENOL) 500 MG tablet Take 1,000 mg by mouth every 6 (six) hours as needed for moderate pain or headache.   albuterol (VENTOLIN HFA) 108 (90 Base) MCG/ACT inhaler INHALE 2 PUFFS EVERY 6 HOURS AS NEEDED FOR WHEEZING   amLODipine (NORVASC) 10 MG tablet Take 1 tablet (10 mg  total) by mouth daily.   aspirin 81 MG tablet Take 81 mg by mouth daily.    Carboxymethylcellul-Glycerin (CLEAR EYES FOR DRY EYES OP) Place 1 drop into both eyes daily as needed (dry eyes).   clobetasol (TEMOVATE) 0.05 % external solution APPLY TO AFFECTED AREA TWICE A DAY   diphenhydrAMINE HCl, Sleep, 50 MG CAPS Take 50 mg by mouth at bedtime as needed (sleep). "jet sleep" otc sleep aid (50 mg)   folic acid (FOLVITE) 1 MG tablet Take 1 mg by mouth daily. rheumatology   hydrochlorothiazide (HYDRODIURIL) 50 MG tablet Take 1 tablet (50 mg total) by mouth daily.   ketoconazole (NIZORAL) 2 % shampoo Apply 1 application topically daily as needed (fungus).   losartan (COZAAR) 25 MG tablet TAKE 1 TABLET (25 MG TOTAL) BY MOUTH IN THE MORNING AND AT BEDTIME.   methotrexate (RHEUMATREX) 2.5 MG tablet Take 10 mg by mouth once a week. Rheumatology   Multiple Vitamin (MULTIVITAMIN WITH MINERALS) TABS tablet Take 1 tablet by mouth 2 (two) times a week.   pantoprazole (PROTONIX) 40 MG tablet TAKE 1 TABLET (40 MG TOTAL) BY MOUTH DAILY. DR Marva Panda   simvastatin (ZOCOR) 20 MG tablet Take 1 tablet (20 mg total) by mouth daily.   triamcinolone (NASACORT) 55 MCG/ACT AERO nasal inhaler Place 2 sprays into the nose daily.   [DISCONTINUED] montelukast (SINGULAIR) 10 MG tablet Take 1 tablet (10 mg total) by mouth at bedtime.    PHQ 2/9 Scores 04/15/2021 03/04/2021 08/29/2020 03/27/2020  PHQ - 2 Score 0 0 0 0  PHQ- 9 Score 0 0 0 0    GAD 7 : Generalized Anxiety Score 04/15/2021 03/04/2021 08/29/2020 03/27/2020  Nervous, Anxious, on Edge 0 0 0 0  Control/stop worrying 0 0 0 0  Worry too much - different things 0 0 0 0  Trouble relaxing 0 0 0 0  Restless 0 0 0 0  Easily annoyed or irritable 0 0 0 0  Afraid - awful might happen 0 0 0 0  Total GAD 7 Score 0 0 0 0  Anxiety Difficulty Not difficult at all Not difficult at all - -    BP Readings from Last 3 Encounters:  04/15/21 120/60  03/27/21 136/76  03/04/21 106/72     Physical Exam Vitals and nursing note reviewed.  Constitutional:      Appearance: She is well-developed.  HENT:     Head: Normocephalic.     Right Ear: Tympanic membrane and external ear normal.     Left Ear: Tympanic membrane and external ear normal.     Nose:     Right Sinus: Maxillary sinus tenderness present.     Left Sinus: Maxillary sinus tenderness present.  Eyes:     General: Lids are everted, no foreign bodies appreciated. No scleral icterus.       Left eye: No foreign body or hordeolum.     Conjunctiva/sclera: Conjunctivae normal.  Right eye: Right conjunctiva is not injected.     Left eye: Left conjunctiva is not injected.     Pupils: Pupils are equal, round, and reactive to light.  Neck:     Thyroid: No thyromegaly.     Vascular: No JVD.     Trachea: No tracheal deviation.  Cardiovascular:     Rate and Rhythm: Normal rate and regular rhythm.     Heart sounds: Normal heart sounds, S1 normal and S2 normal. No murmur heard. No systolic murmur is present.  No diastolic murmur is present.    No friction rub. No gallop. No S3 or S4 sounds.  Pulmonary:     Effort: Pulmonary effort is normal. No respiratory distress.     Breath sounds: Examination of the right-lower field reveals rales. Examination of the left-lower field reveals rales. Rales present. No decreased breath sounds, wheezing or rhonchi.  Abdominal:     General: Bowel sounds are normal.     Palpations: Abdomen is soft. There is no mass.     Tenderness: There is no abdominal tenderness. There is no guarding or rebound.  Musculoskeletal:        General: No tenderness. Normal range of motion.     Cervical back: Normal range of motion and neck supple.  Lymphadenopathy:     Cervical: No cervical adenopathy.     Right cervical: No superficial, deep or posterior cervical adenopathy.    Left cervical: No superficial, deep or posterior cervical adenopathy.  Skin:    General: Skin is warm.     Findings: No  rash.  Neurological:     Mental Status: She is alert and oriented to person, place, and time.     Cranial Nerves: No cranial nerve deficit.     Deep Tendon Reflexes: Reflexes normal.  Psychiatric:        Mood and Affect: Mood is not anxious or depressed.    Wt Readings from Last 3 Encounters:  04/15/21 213 lb (96.6 kg)  03/27/21 216 lb (98 kg)  03/04/21 210 lb (95.3 kg)    BP 120/60    Pulse 79    Temp 98.4 F (36.9 C) (Oral)    Ht 5\' 4"  (1.626 m)    Wt 213 lb (96.6 kg)    LMP  (LMP Unknown) Comment: menopausal, denies preg   SpO2 98%    BMI 36.56 kg/m   Assessment and Plan:  1. Acute maxillary sinusitis, recurrence not specified New onset perhaps a reinfection..  Patient's had some purulent drainage nasally with tenderness over the maxillary sinuses.  Previously was on amoxicillin we will switch to azithromycin to 50 mg 2 tablets today followed by 1 a day for 4 days. - azithromycin (ZITHROMAX) 250 MG tablet; Take 2 tablets on day 1, then 1 tablet daily on days 2 through 5  Dispense: 6 tablet; Refill: 0  2. Acute cough Patient with an acute cough that may have had some blood-tinged sputum associated with it although that this may be of a postnasal nature.  Bilateral posterior rales were noted with deep inspiration.  We will check chest x-ray and as noted above treat with azithromycin. - DG Chest 2 View; Future - azithromycin (ZITHROMAX) 250 MG tablet; Take 2 tablets on day 1, then 1 tablet daily on days 2 through 5  Dispense: 6 tablet; Refill: 0

## 2021-04-15 NOTE — Telephone Encounter (Signed)
?  Chief Complaint: Cough ?Symptoms: Cough with green sputum ?Frequency: 03/27/2021 ?Pertinent Negatives: Patient denies fever ?Disposition: [] ED /[] Urgent Care (no appt availability in office) / [x] Appointment(In office/virtual)/ []  [place] [facility]/ [] Home Care/ [] Refused Recommended Disposition /[] [facility]/ []  Follow-up with PCP ?Additional Notes: Pt was seen 2 weeks ago  and was given antibiotics. Pt has finished these and cough has returned. Pt would like more antibiotics.  ? ?Summary: coughing, congestion not improving  ? Patient states the amoxicillin (AMOXIL) 500 MG capsule helped. When she completed the medication her symptoms worsened, patient experiencing coughing and congestion and would like another round.   ?  ? ?Reason for Disposition ? Cough has been present for > 3 weeks ? ?Answer Assessment - Initial Assessment Questions ?1. ONSET: "When did the cough begin?"  ?    03/27/2020 ?2. SEVERITY: "How bad is the cough today?"  ?    bad ?3. SPUTUM: "Describe the color of your sputum" (none, dry cough; clear, white, yellow, green) ?    Light green ?4. HEMOPTYSIS: "Are you coughing up any blood?" If so ask: "How much?" (flecks, streaks, tablespoons, etc.) ?    no ?5. DIFFICULTY BREATHING: "Are you having difficulty breathing?" If Yes, ask: "How bad is it?" (e.g., mild, moderate, severe)  ?  - MILD: No SOB at rest, mild SOB with walking, speaks normally in sentences, can lie down, no retractions, pulse < 100.  ?  - MODERATE: SOB at rest, SOB with minimal exertion and prefers to sit, cannot lie down flat, speaks in phrases, mild retractions, audible wheezing, pulse 100-120.  ?  - SEVERE: Very SOB at rest, speaks in single words, struggling to breathe, sitting hunched forward, retractions, pulse > 120  ?    mild ?6. FEVER: "Do you have a fever?" If Yes, ask: "What is your temperature, how was it measured, and when did it start?" ?    no ?7. CARDIAC HISTORY: "Do you have any history of heart  disease?" (e.g., heart attack, congestive heart failure)  ?    no ?8. LUNG HISTORY: "Do you have any history of lung disease?"  (e.g., pulmonary embolus, asthma, emphysema) ?    yes ?9. PE RISK FACTORS: "Do you have a history of blood clots?" (or: recent major surgery, recent prolonged travel, bedridden) ?    no ?10. OTHER SYMPTOMS: "Do you have any other symptoms?" (e.g., runny nose, wheezing, chest pain) ?      no ?11. PREGNANCY: "Is there any chance you are pregnant?" "When was your last menstrual period?" ?      no ?12. TRAVEL: "Have you traveled out of the country in the last month?" (e.g., travel history, exposures) ?      no ? ?Protocols used: Cough - Acute Productive-A-AH ? ? ? ? ? ?

## 2021-04-29 DIAGNOSIS — L918 Other hypertrophic disorders of the skin: Secondary | ICD-10-CM | POA: Diagnosis not present

## 2021-04-29 DIAGNOSIS — L738 Other specified follicular disorders: Secondary | ICD-10-CM | POA: Diagnosis not present

## 2021-06-14 ENCOUNTER — Ambulatory Visit
Admission: EM | Admit: 2021-06-14 | Discharge: 2021-06-14 | Disposition: A | Discharge status: Home / Self Care | Payer: BC Managed Care – PPO

## 2021-06-14 DIAGNOSIS — J069 Acute upper respiratory infection, unspecified: Secondary | ICD-10-CM

## 2021-06-14 MED ORDER — BENZONATATE 100 MG PO CAPS: 200.0000 mg | ORAL_CAPSULE | Freq: Three times a day (TID) | ORAL | 0 refills | Status: DC

## 2021-06-14 MED ORDER — PROMETHAZINE-DM 6.25-15 MG/5ML PO SYRP: 5.0000 mL | ORAL_SOLUTION | Freq: Four times a day (QID) | ORAL | 0 refills | Status: DC | PRN

## 2021-06-14 MED ORDER — IPRATROPIUM BROMIDE 0.06 % NA SOLN: 2.0000 | Freq: Four times a day (QID) | NASAL | 12 refills | Status: DC

## 2021-06-14 NOTE — Discharge Instructions (Signed)

## 2021-06-14 NOTE — ED Provider Notes (Signed)
?[facility] ? ? ? ?CSN: [id number] ?Arrival date & time: 06/14/21  1451 ? ? ?  ? ?History   ?Chief Complaint ?Chief Complaint  ?Patient presents with  ? Cough  ? Nasal Congestion  ? ? ?HPI ?Susan Gregory is a 61 y.o. female.  ? ?HPI ? ?61 year old female here for evaluation of respiratory complaints. ? ?Patient reports that for last 1 to 2 days she has been experiencing runny nose with clear nasal discharge, burning sore throat, mild ear pain, nonproductive cough, and decreased appetite.  She states that her grandson has had similar symptoms.  She denies any fever, shortness of breath or wheezing, or GI complaints. ? ?Past Medical History:  ?Diagnosis Date  ? Allergy   ? Barrett's esophagus   ? GERD (gastroesophageal reflux disease)   ? Hyperlipidemia   ? Hypertension   ? ? ?Patient Active Problem List  ? Diagnosis Date Noted  ? Gastroesophageal reflux disease 08/29/2020  ? BMI 35.0-35.9,adult 08/29/2020  ? Abnormal CT of the chest 11/22/2019  ? Familial multiple lipoprotein-type hyperlipidemia 07/18/2014  ? Disorder of lipoprotein and lipid metabolism 07/18/2014  ? Benign hypertension 07/18/2014  ? Acid reflux 07/18/2014  ? Encounter for general adult medical examination without abnormal findings 07/18/2014  ? Neck sprain and strain 07/18/2014  ? Abnormal breast finding 07/18/2014  ? Abnormal finding on breast imaging 07/18/2014  ? Arthritis of knee, degenerative 07/18/2014  ? TB skin/subcutaneous 07/18/2014  ? Fecal occult blood test positive 11/01/2013  ? ? ?Past Surgical History:  ?Procedure Laterality Date  ? BREAST BIOPSY Right 09/04/2016  ? US guided right breast biopsy. Coil marker neg  ? BREAST BIOPSY Right 09/07/2016  ? stereo biopsy. Ribbon marker neg  ? COLONOSCOPY    ? COLONOSCOPY WITH PROPOFOL N/A 11/18/2018  ? Procedure: COLONOSCOPY WITH PROPOFOL;  Surgeon: Christena Deem, MD;  Location: [facility] ENDOSCOPY;  Service: Endoscopy;  Laterality: N/A;  ? ESOPHAGOGASTRODUODENOSCOPY (EGD) WITH  PROPOFOL N/A 04/20/2016  ? Procedure: ESOPHAGOGASTRODUODENOSCOPY (EGD) WITH PROPOFOL;  Surgeon: Christena Deem, MD;  Location: [facility] ENDOSCOPY;  Service: Endoscopy;  Laterality: N/A;  ? ESOPHAGOGASTRODUODENOSCOPY (EGD) WITH PROPOFOL N/A 11/18/2018  ? Procedure: ESOPHAGOGASTRODUODENOSCOPY (EGD) WITH PROPOFOL;  Surgeon: Christena Deem, MD;  Location: [facility] ENDOSCOPY;  Service: Endoscopy;  Laterality: N/A;  ? THROAT SURGERY    ? nodules taken off throat  ? TUBAL LIGATION    ? ? ?OB History   ?No obstetric history on file. ?  ? ? ? ?Home Medications   ? ?Prior to Admission medications   ?Medication Sig Start Date End Date Taking? Authorizing Provider  ?albuterol (VENTOLIN HFA) 108 (90 Base) MCG/ACT inhaler INHALE 2 PUFFS EVERY 6 HOURS AS NEEDED FOR WHEEZING 03/26/20  Yes Duanne Limerick, MD  ?amLODipine (NORVASC) 10 MG tablet Take 1 tablet (10 mg total) by mouth daily. 03/04/21  Yes Duanne Limerick, MD  ?aspirin 81 MG tablet Take 81 mg by mouth daily.    Yes [provider]  ?benzonatate (TESSALON) 100 MG capsule Take 2 capsules (200 mg total) by mouth every 8 (eight) hours. 06/14/21  Yes Becky Augusta, NP  ?folic acid (FOLVITE) 1 MG tablet Take 1 mg by mouth daily. rheumatology 12/29/20  Yes [provider]  ?hydrochlorothiazide (HYDRODIURIL) 50 MG tablet Take 1 tablet (50 mg total) by mouth daily. 03/04/21  Yes Duanne Limerick, MD  ?ipratropium (ATROVENT) 0.06 % nasal spray Place 2 sprays into both nostrils 4 (four) times daily. 06/14/21  Yes  Becky Augusta, NP  ?losartan (COZAAR) 25 MG tablet TAKE 1 TABLET (25 MG TOTAL) BY MOUTH IN THE MORNING AND AT BEDTIME. 03/10/21  Yes Duanne Limerick, MD  ?methotrexate (RHEUMATREX) 2.5 MG tablet Take 10 mg by mouth once a week. Rheumatology 02/23/21  Yes [provider]  ?Multiple Vitamin (MULTIVITAMIN WITH MINERALS) TABS tablet Take 1 tablet by mouth 2 (two) times a week.   Yes [provider]  ?pantoprazole (PROTONIX) 40 MG tablet TAKE 1 TABLET (40  MG TOTAL) BY MOUTH DAILY. DR Marva Panda 12/11/20  Yes Duanne Limerick, MD  ?promethazine-dextromethorphan (PROMETHAZINE-DM) 6.25-15 MG/5ML syrup Take 5 mLs by mouth 4 (four) times daily as needed. 06/14/21  Yes Becky Augusta, NP  ?Pseudoeph-Doxylamine-DM-APAP (NYQUIL PO) Take by mouth.   Yes [provider]  ?simvastatin (ZOCOR) 20 MG tablet Take 1 tablet (20 mg total) by mouth daily. 03/04/21  Yes Duanne Limerick, MD  ?acetaminophen (TYLENOL) 500 MG tablet Take 1,000 mg by mouth every 6 (six) hours as needed for moderate pain or headache.    [provider]  ?Carboxymethylcellul-Glycerin (CLEAR EYES FOR DRY EYES OP) Place 1 drop into both eyes daily as needed (dry eyes).    [provider]  ?clobetasol (TEMOVATE) 0.05 % external solution APPLY TO AFFECTED AREA TWICE A DAY 03/13/19   Duanne Limerick, MD  ?diphenhydrAMINE HCl, Sleep, 50 MG CAPS Take 50 mg by mouth at bedtime as needed (sleep). "jet sleep" otc sleep aid (50 mg)    [provider]  ?griseofulvin (GRIFULVIN V) 500 MG tablet Take 500 mg by mouth 2 (two) times daily as needed (fungus). Dr Cheree Ditto    [provider]  ?ketoconazole (NIZORAL) 2 % shampoo Apply 1 application topically daily as needed (fungus). 03/14/19   [provider]  ?triamcinolone (NASACORT) 55 MCG/ACT AERO nasal inhaler Place 2 sprays into the nose daily. 03/27/21   Duanne Limerick, MD  ? ? ?Family History ?Family History  ?Problem Relation Age of Onset  ? Breast cancer Cousin   ?     mat cousin  ? ? ?Social History ?Social History  ? ?Tobacco Use  ? Smoking status: Never  ? Smokeless tobacco: Never  ?Vaping Use  ? Vaping Use: Never used  ?Substance Use Topics  ? Alcohol use: No  ?  Alcohol/week: 0.0 standard drinks  ? Drug use: No  ? ? ? ?Allergies   ?Patient has no known allergies. ? ? ?Review of Systems ?Review of Systems  ?Constitutional:  Positive for appetite change. Negative for fever.  ?HENT:  Positive for congestion, ear pain, rhinorrhea  and sore throat.   ?Respiratory:  Positive for cough. Negative for shortness of breath and wheezing.   ?Gastrointestinal:  Negative for diarrhea, nausea and vomiting.  ?Skin:  Negative for rash.  ?Hematological: Negative.   ?Psychiatric/Behavioral: Negative.    ? ? ?Physical Exam ?Triage Vital Signs ?ED Triage Vitals  ?Enc Vitals Group  ?   BP 06/14/21 1519 129/85  ?   Pulse Rate 06/14/21 1519 91  ?   Resp 06/14/21 1519 20  ?   Temp 06/14/21 1519 99.6 ?F (37.6 ?C)  ?   Temp Source 06/14/21 1519 Oral  ?   SpO2 06/14/21 1519 98 %  ?   Weight 06/14/21 1515 190 lb (86.2 kg)  ?   Height 06/14/21 1515 5\' 4"  (1.626 m)  ?   Head Circumference --   ?   Peak Flow --   ?  Pain Score 06/14/21 1512 0  ?   Pain Loc --   ?   Pain Edu? --   ?   Excl. in GC? --   ? ?No data found. ? ?Updated Vital Signs ?BP 129/85 (BP Location: Left Arm)   Pulse 91   Temp 99.6 ?F (37.6 ?C) (Oral)   Resp 20   Ht 5\' 4"  (1.626 m)   Wt 190 lb (86.2 kg)   LMP  (LMP Unknown) Comment: menopausal, denies preg  SpO2 98%   BMI 32.61 kg/m?  ? ?Visual Acuity ?Right Eye Distance:   ?Left Eye Distance:   ?Bilateral Distance:   ? ?Right Eye Near:   ?Left Eye Near:    ?Bilateral Near:    ? ?Physical Exam ?Vitals and nursing note reviewed.  ?Constitutional:   ?   Appearance: Normal appearance. She is not ill-appearing.  ?HENT:  ?   Head: Normocephalic and atraumatic.  ?   Right Ear: Tympanic membrane, ear canal and external ear normal. There is no impacted cerumen.  ?   Left Ear: Tympanic membrane, ear canal and external ear normal. There is no impacted cerumen.  ?   Nose: Congestion and rhinorrhea present.  ?   Mouth/Throat:  ?   Mouth: Mucous membranes are moist.  ?   Pharynx: Oropharynx is clear. Posterior oropharyngeal erythema present. No oropharyngeal exudate.  ?Cardiovascular:  ?   Rate and Rhythm: Normal rate and regular rhythm.  ?   Pulses: Normal pulses.  ?   Heart sounds: Normal heart sounds. No murmur heard. ?  No friction rub. No gallop.   ?Pulmonary:  ?   Effort: Pulmonary effort is normal.  ?   Breath sounds: Normal breath sounds. No wheezing, rhonchi or rales.  ?Musculoskeletal:  ?   Cervical back: Normal range of motion and neck supple.  ?Lymphadenopa

## 2021-06-14 NOTE — ED Triage Notes (Signed)
Patient is here for "Scratchy throat, Cough & Body aches from coughing so much". Decreased PO's "since yesterday". No fever. Some runny nose.  ?

## 2021-06-18 ENCOUNTER — Other Ambulatory Visit: Payer: Self-pay | Admitting: Pulmonary Disease

## 2021-06-18 DIAGNOSIS — D860 Sarcoidosis of lung: Secondary | ICD-10-CM | POA: Diagnosis not present

## 2021-06-18 DIAGNOSIS — Z7960 Long term (current) use of unspecified immunomodulators and immunosuppressants: Secondary | ICD-10-CM | POA: Diagnosis not present

## 2021-06-18 DIAGNOSIS — R899 Unspecified abnormal finding in specimens from other organs, systems and tissues: Secondary | ICD-10-CM | POA: Diagnosis not present

## 2021-06-18 DIAGNOSIS — J849 Interstitial pulmonary disease, unspecified: Secondary | ICD-10-CM

## 2021-06-18 DIAGNOSIS — R9389 Abnormal findings on diagnostic imaging of other specified body structures: Secondary | ICD-10-CM | POA: Diagnosis not present

## 2021-06-22 ENCOUNTER — Other Ambulatory Visit: Payer: Self-pay | Admitting: Family Medicine

## 2021-06-22 DIAGNOSIS — H6982 Other specified disorders of Eustachian tube, left ear: Secondary | ICD-10-CM

## 2021-06-22 DIAGNOSIS — J0100 Acute maxillary sinusitis, unspecified: Secondary | ICD-10-CM

## 2021-06-22 DIAGNOSIS — J4521 Mild intermittent asthma with (acute) exacerbation: Secondary | ICD-10-CM

## 2021-06-24 ENCOUNTER — Ambulatory Visit: Payer: Self-pay | Admitting: *Deleted

## 2021-06-24 NOTE — Telephone Encounter (Signed)
Per agent: ?"Pt called in stating she was recently seen in the ED and was giving cough medication but states it is still not working, and wanted to talk with a nurse about what she do, please advise." ? ? ? ?Chief Complaint: Cough ?Symptoms: Cough at night and in mornings, intermittent. Productive for small amount light greenish phlegm at times. Seen in UC 06/14/21, Viral URI. States "Much better just hacking cough lingering."  ?Frequency: UC 06/14/21 ?Pertinent Negatives: Patient denies fever, SOB, wheezing ?Disposition: [] ED /[] Urgent Care (no appt availability in office) / [] Appointment(In office/virtual)/ []  [place] [facility]/ [] Home Care/ [] Refused Recommended Disposition /[] [place] [facility]/ [x]  Follow-up with PCP ?Additional Notes: States had appt Wednesday with Pulmonologist, was told "Lung test great, better than last year, lungs were good." Pt questioning if there is anything else could be called in , she is out of Tessalon and Promethazine prescribed in UC. Did provide home care advise per protocol.  ? ?  ? ? ? ?Reason for Disposition ? Cough ? ?Answer Assessment - Initial Assessment Questions ?1. ONSET: "When did the cough begin?"  ?    UC 06/14/21 ?2. SEVERITY: "How bad is the cough today?"  ?    Mostly at night and in mornings ?3. SPUTUM: "Describe the color of your sputum" (none, dry cough; clear, white, yellow, green) ?    Light greenish ?4. HEMOPTYSIS: "Are you coughing up any blood?" If so ask: "How much?" (flecks, streaks, tablespoons, etc.) ?    no ?5. DIFFICULTY BREATHING: "Are you having difficulty breathing?" If Yes, ask: "How bad is it?" (e.g., mild, moderate, severe)  ?  - MILD: No SOB at rest, mild SOB with walking, speaks normally in sentences, can lie down, no retractions, pulse < 100.  ?  - MODERATE: SOB at rest, SOB with minimal exertion and prefers to sit, cannot lie down flat, speaks in phrases, mild retractions, audible wheezing, pulse 100-120.  ?  - SEVERE: Very SOB at rest,  speaks in single words, struggling to breathe, sitting hunched forward, retractions, pulse > 120  ?    no ?6. FEVER: "Do you have a fever?" If Yes, ask: "What is your temperature, how was it measured, and when did it start?" ?    no ?7. CARDIAC HISTORY: "Do you have any history of heart disease?" (e.g., heart attack, congestive heart failure)  ?     ?8. LUNG HISTORY: "Do you have any history of lung disease?"  (e.g., pulmonary embolus, asthma, emphysema) ?    Sarcoidosis. ?9. PE RISK FACTORS: "Do you have a history of blood clots?" (or: recent major surgery, recent prolonged travel, bedridden) ?     ?10. OTHER SYMPTOMS: "Do you have any other symptoms?" (e.g., runny nose, wheezing, chest pain) ?      No ? ?Protocols used: Cough - Acute Productive-A-AH ? ?

## 2021-06-25 ENCOUNTER — Ambulatory Visit
Admission: RE | Admit: 2021-06-25 | Discharge: 2021-06-25 | Disposition: A | Discharge status: Home / Self Care | Payer: BC Managed Care – PPO | Source: Ambulatory Visit | Attending: Pulmonary Disease | Admitting: Pulmonary Disease

## 2021-06-25 DIAGNOSIS — I700 Atherosclerosis of aorta: Secondary | ICD-10-CM | POA: Diagnosis not present

## 2021-06-25 DIAGNOSIS — J479 Bronchiectasis, uncomplicated: Secondary | ICD-10-CM | POA: Diagnosis not present

## 2021-06-25 DIAGNOSIS — J849 Interstitial pulmonary disease, unspecified: Secondary | ICD-10-CM

## 2021-06-25 DIAGNOSIS — D860 Sarcoidosis of lung: Secondary | ICD-10-CM

## 2021-07-23 ENCOUNTER — Other Ambulatory Visit: Payer: Self-pay | Admitting: Family Medicine

## 2021-07-23 DIAGNOSIS — Z1231 Encounter for screening mammogram for malignant neoplasm of breast: Secondary | ICD-10-CM

## 2021-07-24 ENCOUNTER — Other Ambulatory Visit: Payer: Self-pay | Admitting: Family Medicine

## 2021-07-24 DIAGNOSIS — I10 Essential (primary) hypertension: Secondary | ICD-10-CM

## 2021-07-24 DIAGNOSIS — E7849 Other hyperlipidemia: Secondary | ICD-10-CM

## 2021-07-24 NOTE — Telephone Encounter (Signed)
Refill available, enough meds until appt 09/01/21 all meds Requested Prescriptions  Pending Prescriptions Disp Refills  . hydrochlorothiazide (HYDRODIURIL) 50 MG tablet [Pharmacy Med Name: HYDROCHLOROTHIAZIDE 50 MG TAB] 90 tablet 1    Sig: TAKE 1 TABLET BY MOUTH EVERY DAY     Cardiovascular: Diuretics - Thiazide Failed - 07/24/2021  9:53 AM      Failed - Cr in normal range and within 180 days    Creatinine, Ser  Date Value Ref Range Status  07/02/2020 0.80 0.44 - 1.00 mg/dL Final         Failed - K in normal range and within 180 days    Potassium  Date Value Ref Range Status  03/27/2020 4.0 3.5 - 5.2 mmol/L Final         Failed - Na in normal range and within 180 days    Sodium  Date Value Ref Range Status  03/27/2020 143 134 - 144 mmol/L Final         Passed - Last BP in normal range    BP Readings from Last 1 Encounters:  06/14/21 129/85         Passed - Valid encounter within last 6 months    Recent Outpatient Visits          3 months ago Acute maxillary sinusitis, recurrence not specified   [facility] Duanne Limerick, MD   3 months ago Acute maxillary sinusitis, recurrence not specified   [facility] Duanne Limerick, MD   4 months ago Benign hypertension   [facility] Duanne Limerick, MD   10 months ago Benign hypertension   [facility] Duanne Limerick, MD   1 year ago Benign hypertension   [facility] Duanne Limerick, MD      Future Appointments            In 1 month Duanne Limerick, MD [facility]           . simvastatin (ZOCOR) 20 MG tablet [Pharmacy Med Name: SIMVASTATIN 20 MG TABLET] 90 tablet 1    Sig: TAKE 1 TABLET BY MOUTH EVERY DAY     Cardiovascular:  Antilipid - Statins Failed - 07/24/2021  9:53 AM      Failed - Lipid Panel in normal range within the last 12 months    Cholesterol, Total  Date Value Ref Range Status  03/04/2021 178 100 - 199 mg/dL Final   LDL Chol Calc (NIH)   Date Value Ref Range Status  03/04/2021 111 (H) 0 - 99 mg/dL Final   HDL  Date Value Ref Range Status  03/04/2021 54 >39 mg/dL Final   Triglycerides  Date Value Ref Range Status  03/04/2021 71 0 - 149 mg/dL Final         Passed - Patient is not pregnant      Passed - Valid encounter within last 12 months    Recent Outpatient Visits          3 months ago Acute maxillary sinusitis, recurrence not specified   [facility] Duanne Limerick, MD   3 months ago Acute maxillary sinusitis, recurrence not specified   [facility] Duanne Limerick, MD   4 months ago Benign hypertension   [facility] Duanne Limerick, MD   10 months ago Benign hypertension   [facility] Duanne Limerick, MD   1 year ago Benign hypertension  [facility] Duanne Limerick, MD      Future Appointments            In 1 month Duanne Limerick, MD [facility], [facility]           . amLODipine (NORVASC) 10 MG tablet [Pharmacy Med Name: AMLODIPINE BESYLATE 10 MG TAB] 90 tablet 1    Sig: TAKE 1 TABLET BY MOUTH EVERY DAY     Cardiovascular: Calcium Channel Blockers 2 Passed - 07/24/2021  9:53 AM      Passed - Last BP in normal range    BP Readings from Last 1 Encounters:  06/14/21 129/85         Passed - Last Heart Rate in normal range    Pulse Readings from Last 1 Encounters:  06/14/21 91         Passed - Valid encounter within last 6 months    Recent Outpatient Visits          3 months ago Acute maxillary sinusitis, recurrence not specified   [facility] Duanne Limerick, MD   3 months ago Acute maxillary sinusitis, recurrence not specified   [facility] Duanne Limerick, MD   4 months ago Benign hypertension   [facility] Duanne Limerick, MD   10 months ago Benign hypertension   [facility] Duanne Limerick, MD   1 year ago Benign hypertension   [facility] Duanne Limerick, MD      Future  Appointments            In 1 month Duanne Limerick, MD [facility], [facility]

## 2021-09-01 ENCOUNTER — Ambulatory Visit (INDEPENDENT_AMBULATORY_CARE_PROVIDER_SITE_OTHER): Payer: BC Managed Care – PPO | Admitting: Family Medicine

## 2021-09-01 ENCOUNTER — Encounter: Payer: Self-pay | Admitting: Family Medicine

## 2021-09-01 DIAGNOSIS — I10 Essential (primary) hypertension: Secondary | ICD-10-CM

## 2021-09-01 DIAGNOSIS — E7849 Other hyperlipidemia: Secondary | ICD-10-CM

## 2021-09-01 MED ORDER — HYDROCHLOROTHIAZIDE 50 MG PO TABS: 50.0000 mg | ORAL_TABLET | Freq: Every day | ORAL | 1 refills | Status: DC

## 2021-09-01 MED ORDER — SIMVASTATIN 20 MG PO TABS: 20.0000 mg | ORAL_TABLET | Freq: Every day | ORAL | 1 refills | Status: DC

## 2021-09-01 MED ORDER — LOSARTAN POTASSIUM 25 MG PO TABS: 25.0000 mg | ORAL_TABLET | Freq: Two times a day (BID) | ORAL | 1 refills | Status: DC

## 2021-09-01 MED ORDER — AMLODIPINE BESYLATE 10 MG PO TABS: 10.0000 mg | ORAL_TABLET | Freq: Every day | ORAL | 1 refills | Status: DC

## 2021-09-01 NOTE — Progress Notes (Signed)
Date:  09/01/2021   Name:  Susan Gregory   DOB:  Mar 10, 1960   MRN:  [id number]   Chief Complaint: Hypertension and Hyperlipidemia  Hypertension This is a chronic problem. The current episode started more than 1 year ago. The problem has been gradually improving since onset. The problem is controlled. Associated symptoms include sweats. Pertinent negatives include no anxiety, blurred vision, chest pain, headaches, malaise/fatigue, neck pain, orthopnea, palpitations, peripheral edema, PND or shortness of breath. There are no associated agents to hypertension. Past treatments include calcium channel blockers, angiotensin blockers and diuretics. The current treatment provides moderate improvement. There are no compliance problems.  There is no history of angina, kidney disease, CAD/MI, CVA, heart failure, left ventricular hypertrophy, PVD or retinopathy. There is no history of chronic renal disease, a hypertension causing med or renovascular disease.  Hyperlipidemia This is a chronic problem. The current episode started more than 1 year ago. The problem is controlled. Recent lipid tests were reviewed and are normal. She has no history of chronic renal disease, diabetes, hypothyroidism, liver disease, obesity or nephrotic syndrome. There are no known factors aggravating her hyperlipidemia. Pertinent negatives include no chest pain, focal sensory loss, focal weakness, leg pain, myalgias or shortness of breath. Current antihyperlipidemic treatment includes statins. The current treatment provides moderate improvement of lipids. There are no compliance problems.  Risk factors for coronary artery disease include hypertension and dyslipidemia.    Lab Results  Component Value Date   NA 143 03/27/2020   K 4.0 03/27/2020   CO2 27 03/27/2020   GLUCOSE 87 03/27/2020   BUN 12 03/27/2020   CREATININE 0.80 07/02/2020   CALCIUM 10.0 03/27/2020   GFRNONAA 73 03/27/2020   Lab Results  Component Value Date    CHOL 178 03/04/2021   HDL 54 03/04/2021   LDLCALC 111 (H) 03/04/2021   TRIG 71 03/04/2021   CHOLHDL 3.9 09/26/2019   No results found for: "TSH" No results found for: "HGBA1C" Lab Results  Component Value Date   WBC 3.5 (L) 08/15/2017   HGB 14.5 08/15/2017   HCT 42.2 08/15/2017   MCV 86.6 08/15/2017   PLT 181 08/15/2017   Lab Results  Component Value Date   ALT 23 03/27/2020   AST 22 03/27/2020   ALKPHOS 75 03/27/2020   BILITOT 0.4 03/27/2020   No results found for: "25OHVITD2", "25OHVITD3", "VD25OH"   Review of Systems  Constitutional:  Negative for fatigue, malaise/fatigue and unexpected weight change.  HENT:  Negative for ear pain, postnasal drip and tinnitus.   Eyes:  Negative for blurred vision and visual disturbance.  Respiratory:  Negative for cough, shortness of breath and wheezing.   Cardiovascular:  Negative for chest pain, palpitations, orthopnea, leg swelling and PND.  Gastrointestinal:  Negative for blood in stool, constipation and rectal pain.  Endocrine: Negative for polydipsia and polyuria.  Musculoskeletal:  Negative for myalgias and neck pain.  Neurological:  Negative for focal weakness and headaches.    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 Coil marker neg   BREAST BIOPSY Right 09/07/2016   stereo biopsy. Ribbon marker neg   COLONOSCOPY     COLONOSCOPY WITH PROPOFOL N/A 11/18/2018   Procedure: COLONOSCOPY WITH PROPOFOL;  Surgeon: Christena Deem, MD;  Location: [facility] ENDOSCOPY;  Service: Endoscopy;  Laterality: N/A;   ESOPHAGOGASTRODUODENOSCOPY (EGD) WITH PROPOFOL N/A 04/20/2016   Procedure: ESOPHAGOGASTRODUODENOSCOPY (EGD) WITH PROPOFOL;  Surgeon: Christena Deem, MD;  Location: [facility] ENDOSCOPY;  Service: Endoscopy;  Laterality: N/A;   ESOPHAGOGASTRODUODENOSCOPY (EGD) WITH PROPOFOL N/A 11/18/2018   Procedure: ESOPHAGOGASTRODUODENOSCOPY (EGD) WITH PROPOFOL;  Surgeon: Christena Deem, MD;  Location: [facility] ENDOSCOPY;  Service: Endoscopy;  Laterality: N/A;   THROAT SURGERY     nodules taken off throat   TUBAL LIGATION      Social History   Tobacco Use   Smoking status: Never   Smokeless tobacco: Never  Vaping Use   Vaping Use: Never used  Substance Use Topics   Alcohol use: No    Alcohol/week: 0.0 standard drinks of alcohol   Drug use: No     Medication list has been reviewed and updated.  Current Meds  Medication Sig   acetaminophen (TYLENOL) 500 MG tablet Take 1,000 mg by mouth every 6 (six) hours as needed for moderate pain or headache.   albuterol (VENTOLIN HFA) 108 (90 Base) MCG/ACT inhaler INHALE 2 PUFFS EVERY 6 HOURS AS NEEDED FOR WHEEZING   amLODipine (NORVASC) 10 MG tablet Take 1 tablet (10 mg total) by mouth daily.   aspirin 81 MG tablet Take 81 mg by mouth daily.    Carboxymethylcellul-Glycerin (CLEAR EYES FOR DRY EYES OP) Place 1 drop into both eyes daily as needed (dry eyes).   folic acid (FOLVITE) 1 MG tablet Take 1 mg by mouth daily. rheumatology   griseofulvin (GRIFULVIN V) 500 MG tablet Take 500 mg by mouth 2 (two) times daily as needed (fungus). Dr Cheree Ditto   hydrochlorothiazide (HYDRODIURIL) 50 MG tablet Take 1 tablet (50 mg  total) by mouth daily.   ketoconazole (NIZORAL) 2 % shampoo Apply 1 application topically daily as needed (fungus).   losartan (COZAAR) 25 MG tablet TAKE 1 TABLET (25 MG TOTAL) BY MOUTH IN THE MORNING AND AT BEDTIME.   methotrexate (RHEUMATREX) 2.5 MG tablet Take 10 mg by mouth once a week. Rheumatology   Multiple Vitamin (MULTIVITAMIN WITH MINERALS) TABS tablet Take 1 tablet by mouth 2 (two) times a week.   pantoprazole (PROTONIX) 40 MG tablet TAKE 1 TABLET (40 MG TOTAL) BY MOUTH DAILY. DR Marva Panda   simvastatin (ZOCOR) 20 MG tablet Take 1 tablet (20 mg total) by mouth daily.   [DISCONTINUED] benzonatate (TESSALON) 100 MG capsule Take 2 capsules (200 mg total) by mouth every 8 (eight) hours.   [DISCONTINUED] montelukast (SINGULAIR) 10 MG tablet TAKE 1 TABLET BY MOUTH EVERYDAY AT BEDTIME       09/01/2021   10:23 AM 04/15/2021   11:57 AM 03/04/2021   10:06 AM 08/29/2020    2:12 PM  GAD 7 : Generalized Anxiety Score  Nervous, Anxious, on Edge 0 0 0 0  Control/stop worrying 0 0 0 0  Worry too much - different things 0 0 0 0  Trouble relaxing 0 0 0 0  Restless 0 0 0 0  Easily annoyed or irritable 0 0 0 0  Afraid - awful might happen 0 0 0 0  Total GAD 7 Score 0 0 0 0  Anxiety Difficulty Not difficult at all Not difficult at all Not difficult at all        09/01/2021   10:22 AM 04/15/2021   11:57 AM 03/04/2021   10:05 AM  Depression screen PHQ 2/9  Decreased Interest 0 0 0  Down, Depressed, Hopeless 0 0 0  PHQ - 2 Score 0 0 0  Altered sleeping 0 0 0  Tired, decreased energy 0 0 0  Change in appetite 0 0 0  Feeling bad or failure about yourself  0 0 0  Trouble concentrating 0 0 0  Moving slowly or fidgety/restless 0 0 0  Suicidal thoughts 0 0 0  PHQ-9 Score 0 0 0  Difficult doing work/chores Not difficult at all Not difficult at all Not difficult at all    BP Readings from Last 3 Encounters:  09/01/21 120/80  06/14/21 129/85  04/15/21 120/60    Physical Exam Vitals reviewed.   Constitutional:      General: She is not in acute distress. HENT:     Head: Normocephalic.     Right Ear: Tympanic membrane, ear canal and external ear normal. There is no impacted cerumen.     Left Ear: Tympanic membrane, ear canal and external ear normal. There is no impacted cerumen.     Mouth/Throat:     Mouth: Mucous membranes are moist.  Cardiovascular:     Pulses: Normal pulses.     Heart sounds: Normal heart sounds. No murmur heard.    No friction rub. No gallop.  Pulmonary:     Breath sounds: No wheezing, rhonchi or rales.  Chest:     Chest wall: No tenderness.  Abdominal:     Tenderness: There is no abdominal tenderness. There is no guarding or rebound.     Hernia: No hernia is present.  Musculoskeletal:     Cervical back: Normal range of motion.  Neurological:     Deep Tendon Reflexes: Reflexes normal.     Wt Readings from Last 3 Encounters:  09/01/21 218 lb (98.9 kg)  06/14/21 190 lb (86.2 kg)  04/15/21 213 lb (96.6 kg)    BP 120/80   Pulse 88   Ht 5\' 4"  (1.626 m)   Wt 218 lb (98.9 kg)   LMP  (LMP Unknown) Comment: menopausal, denies preg  BMI 37.42 kg/m   Assessment and Plan:   1. Benign hypertension Chronic.  Controlled.  Stable.  Blood pressure 120/80.  Asymptomatic.  Tolerating medications well.  Continue amlodipine 10 mg once a day, hydrochlorothiazide 50 mg once a day, and losartan 25 mg once a day.  We will check renal function panel for electrolytes and GFR.  We will recheck patient in 6 months. - amLODipine (NORVASC) 10 MG tablet; Take 1 tablet (10 mg total) by mouth daily.  Dispense: 90 tablet; Refill: 1 - hydrochlorothiazide (HYDRODIURIL) 50 MG tablet; Take 1 tablet (50 mg total) by mouth daily.  Dispense: 90 tablet; Refill: 1 - losartan (COZAAR) 25 MG tablet; Take 1 tablet (25 mg total) by mouth in the morning and at bedtime.  Dispense: 180 tablet; Refill: 1 - Renal Function Panel  2. Familial multiple lipoprotein-type  hyperlipidemia Chronic.  Controlled.  Stable.  Controlled on simvastatin 20 mg once a day.  We will check lipid panel for  current status of LDL. - simvastatin (ZOCOR) 20 MG tablet; Take 1 tablet (20 mg total) by mouth daily.  Dispense: 90 tablet; Refill: 1 - Lipid Panel With LDL/HDL Ratio

## 2021-09-02 LAB — RENAL FUNCTION PANEL
Albumin: 4.1 g/dL (ref 3.9–4.9)
BUN/Creatinine Ratio: 14 (ref 12–28)
BUN: 12 mg/dL (ref 8–27)
CO2: 28 mmol/L (ref 20–29)
Calcium: 9.7 mg/dL (ref 8.7–10.3)
Chloride: 106 mmol/L (ref 96–106)
Creatinine, Ser: 0.83 mg/dL (ref 0.57–1.00)
Glucose: 89 mg/dL (ref 70–99)
Phosphorus: 2.8 mg/dL — ABNORMAL LOW (ref 3.0–4.3)
Potassium: 3.7 mmol/L (ref 3.5–5.2)
Sodium: 145 mmol/L — ABNORMAL HIGH (ref 134–144)
eGFR: 79 mL/min/{1.73_m2} (ref >59)

## 2021-09-02 LAB — LIPID PANEL WITH LDL/HDL RATIO
Cholesterol, Total: 171 mg/dL (ref 100–199)
HDL: 58 mg/dL (ref >39)
LDL Chol Calc (NIH): 100 mg/dL — ABNORMAL HIGH (ref 0–99)
LDL/HDL Ratio: 1.7 ratio (ref 0.0–3.2)
Triglycerides: 65 mg/dL (ref 0–149)
VLDL Cholesterol Cal: 13 mg/dL (ref 5–40)

## 2021-09-08 ENCOUNTER — Ambulatory Visit
Admission: RE | Admit: 2021-09-08 | Discharge: 2021-09-08 | Disposition: A | Discharge status: Home / Self Care | Payer: BC Managed Care – PPO | Source: Ambulatory Visit | Attending: Family Medicine | Admitting: Family Medicine

## 2021-09-08 DIAGNOSIS — Z1231 Encounter for screening mammogram for malignant neoplasm of breast: Secondary | ICD-10-CM | POA: Insufficient documentation

## 2021-09-26 ENCOUNTER — Encounter: Payer: Self-pay | Admitting: Family Medicine

## 2021-09-26 ENCOUNTER — Other Ambulatory Visit (HOSPITAL_COMMUNITY)
Admission: RE | Admit: 2021-09-26 | Discharge: 2021-09-26 | Disposition: A | Discharge status: Home / Self Care | Payer: BC Managed Care – PPO | Source: Ambulatory Visit | Attending: Family Medicine | Admitting: Family Medicine

## 2021-09-26 ENCOUNTER — Ambulatory Visit (INDEPENDENT_AMBULATORY_CARE_PROVIDER_SITE_OTHER): Payer: BC Managed Care – PPO | Admitting: Family Medicine

## 2021-09-26 VITALS — BP 120/82 | HR 60 | Ht 64.0 in | Wt 218.0 lb

## 2021-09-26 DIAGNOSIS — Z0000 Encounter for general adult medical examination without abnormal findings: Secondary | ICD-10-CM

## 2021-09-26 DIAGNOSIS — Z1211 Encounter for screening for malignant neoplasm of colon: Secondary | ICD-10-CM | POA: Diagnosis not present

## 2021-09-26 DIAGNOSIS — Z124 Encounter for screening for malignant neoplasm of cervix: Secondary | ICD-10-CM | POA: Insufficient documentation

## 2021-09-26 LAB — HEMOCCULT GUIAC POC 1CARD (OFFICE): Fecal Occult Blood, POC: NEGATIVE

## 2021-09-26 NOTE — Progress Notes (Signed)
Date:  09/26/2021   Name:  Susan Gregory   DOB:  [birth year]   MRN:  [id number]   Chief Complaint: Annual Exam  Patient is a 61 year old female who presents for a comprehensive physical exam. The patient reports the following problems: none. Health maintenance has been reviewed up to date.      Lab Results  Component Value Date   NA 145 (H) 09/01/2021   K 3.7 09/01/2021   CO2 28 09/01/2021   GLUCOSE 89 09/01/2021   BUN 12 09/01/2021   CREATININE 0.83 09/01/2021   CALCIUM 9.7 09/01/2021   EGFR 79 09/01/2021   GFRNONAA 73 03/27/2020   Lab Results  Component Value Date   CHOL 171 09/01/2021   HDL 58 09/01/2021   LDLCALC 100 (H) 09/01/2021   TRIG 65 09/01/2021   CHOLHDL 3.9 09/26/2019   No results found for: "TSH" No results found for: "HGBA1C" Lab Results  Component Value Date   WBC 3.5 (L) 08/15/2017   HGB 14.5 08/15/2017   HCT 42.2 08/15/2017   MCV 86.6 08/15/2017   PLT 181 08/15/2017   Lab Results  Component Value Date   ALT 23 03/27/2020   AST 22 03/27/2020   ALKPHOS 75 03/27/2020   BILITOT 0.4 03/27/2020   No results found for: "25OHVITD2", "25OHVITD3", "VD25OH"   Review of Systems  Constitutional:  Negative for chills, fatigue, fever and unexpected weight change.  HENT:  Negative for drooling, ear discharge, ear pain, sore throat and trouble swallowing.   Eyes:  Negative for visual disturbance.  Respiratory:  Negative for cough, shortness of breath and wheezing.   Cardiovascular:  Negative for chest pain, palpitations and leg swelling.  Gastrointestinal:  Negative for abdominal pain, blood in stool, constipation, diarrhea and nausea.  Endocrine: Negative for polydipsia.  Genitourinary:  Negative for difficulty urinating, dysuria, frequency, hematuria and urgency.  Musculoskeletal:  Negative for arthralgias, back pain, myalgias and neck pain.  Skin:  Negative for rash.  Allergic/Immunologic: Negative for environmental allergies.  Neurological:   Negative for dizziness and headaches.  Hematological:  Negative for adenopathy. Does not bruise/bleed easily.  Psychiatric/Behavioral:  Negative for suicidal ideas. The patient is not nervous/anxious.     Patient Active Problem List   Diagnosis Date Noted   Gastroesophageal reflux disease 08/29/2020   BMI 35.0-35.9,adult 08/29/2020   Abnormal CT of the chest 11/22/2019   Familial multiple lipoprotein-type hyperlipidemia 07/18/2014   Disorder of lipoprotein and lipid metabolism 07/18/2014   Benign hypertension 07/18/2014   Acid reflux 07/18/2014   Encounter for general adult medical examination without abnormal findings 07/18/2014   Neck sprain and strain 07/18/2014   Abnormal breast finding 07/18/2014   Abnormal finding on breast imaging 07/18/2014   Arthritis of knee, degenerative 07/18/2014   TB skin/subcutaneous 07/18/2014   Fecal occult blood test positive 11/01/2013    No Known Allergies  Past Surgical History:  Procedure Laterality Date   BREAST BIOPSY Right 09/04/2016   US guided right breast biopsy. Coil marker neg   BREAST BIOPSY Right 09/07/2016   stereo biopsy. Ribbon marker neg   COLONOSCOPY     COLONOSCOPY WITH PROPOFOL N/A 11/18/2018   Procedure: COLONOSCOPY WITH PROPOFOL;  Surgeon: Lollie Sails, MD;  Location: [facility] ENDOSCOPY;  Service: Endoscopy;  Laterality: N/A;   ESOPHAGOGASTRODUODENOSCOPY (EGD) WITH PROPOFOL N/A 04/20/2016   Procedure: ESOPHAGOGASTRODUODENOSCOPY (EGD) WITH PROPOFOL;  Surgeon: Lollie Sails, MD;  Location: [facility] ENDOSCOPY;  Service: Endoscopy;  Laterality: N/A;  ESOPHAGOGASTRODUODENOSCOPY (EGD) WITH PROPOFOL N/A 11/18/2018   Procedure: ESOPHAGOGASTRODUODENOSCOPY (EGD) WITH PROPOFOL;  Surgeon: Lollie Sails, MD;  Location: [facility] ENDOSCOPY;  Service: Endoscopy;  Laterality: N/A;   THROAT SURGERY     nodules taken off throat   TUBAL LIGATION      Social History   Tobacco Use   Smoking status: Never   Smokeless tobacco: Never   Vaping Use   Vaping Use: Never used  Substance Use Topics   Alcohol use: No    Alcohol/week: 0.0 standard drinks of alcohol   Drug use: No     Medication list has been reviewed and updated.  Current Meds  Medication Sig   acetaminophen (TYLENOL) 500 MG tablet Take 1,000 mg by mouth every 6 (six) hours as needed for moderate pain or headache.   albuterol (VENTOLIN HFA) 108 (90 Base) MCG/ACT inhaler INHALE 2 PUFFS EVERY 6 HOURS AS NEEDED FOR WHEEZING   amLODipine (NORVASC) 10 MG tablet Take 1 tablet (10 mg total) by mouth daily.   aspirin 81 MG tablet Take 81 mg by mouth daily.    Carboxymethylcellul-Glycerin (CLEAR EYES FOR DRY EYES OP) Place 1 drop into both eyes daily as needed (dry eyes).   folic acid (FOLVITE) 1 MG tablet Take 1 mg by mouth daily. rheumatology   griseofulvin (GRIFULVIN V) 500 MG tablet Take 500 mg by mouth 2 (two) times daily as needed (fungus). Dr Phillip Heal   hydrochlorothiazide (HYDRODIURIL) 50 MG tablet Take 1 tablet (50 mg total) by mouth daily.   ketoconazole (NIZORAL) 2 % shampoo Apply 1 application topically daily as needed (fungus).   losartan (COZAAR) 25 MG tablet Take 1 tablet (25 mg total) by mouth in the morning and at bedtime.   methotrexate (RHEUMATREX) 2.5 MG tablet Take 10 mg by mouth once a week. Rheumatology   Multiple Vitamin (MULTIVITAMIN WITH MINERALS) TABS tablet Take 1 tablet by mouth 2 (two) times a week.   pantoprazole (PROTONIX) 40 MG tablet TAKE 1 TABLET (40 MG TOTAL) BY MOUTH DAILY. DR Gustavo Lah   simvastatin (ZOCOR) 20 MG tablet Take 1 tablet (20 mg total) by mouth daily.       09/26/2021    9:22 AM 09/01/2021   10:23 AM 04/15/2021   11:57 AM 03/04/2021   10:06 AM  GAD 7 : Generalized Anxiety Score  Nervous, Anxious, on Edge 0 0 0 0  Control/stop worrying 0 0 0 0  Worry too much - different things 0 0 0 0  Trouble relaxing 0 0 0 0  Restless 0 0 0 0  Easily annoyed or irritable 0 0 0 0  Afraid - awful might happen 0 0 0 0  Total GAD  7 Score 0 0 0 0  Anxiety Difficulty Not difficult at all Not difficult at all Not difficult at all Not difficult at all       09/26/2021    9:21 AM 09/01/2021   10:22 AM 04/15/2021   11:57 AM  Depression screen PHQ 2/9  Decreased Interest 0 0 0  Down, Depressed, Hopeless 0 0 0  PHQ - 2 Score 0 0 0  Altered sleeping 0 0 0  Tired, decreased energy 0 0 0  Change in appetite 0 0 0  Feeling bad or failure about yourself  0 0 0  Trouble concentrating 0 0 0  Moving slowly or fidgety/restless 0 0 0  Suicidal thoughts 0 0 0  PHQ-9 Score 0 0 0  Difficult doing work/chores Not difficult at  all Not difficult at all Not difficult at all    BP Readings from Last 3 Encounters:  09/26/21 120/82  09/01/21 120/80  06/14/21 129/85    Physical Exam Vitals and nursing note reviewed. Exam conducted with a chaperone present.  Constitutional:      Appearance: Normal appearance. She is well-developed, well-groomed and overweight.  HENT:     Head: Normocephalic.     Jaw: There is normal jaw occlusion.     Right Ear: Hearing, tympanic membrane, ear canal and external ear normal.     Left Ear: Hearing, tympanic membrane, ear canal and external ear normal.     Nose: Nose normal.     Mouth/Throat:     Lips: Pink.     Mouth: Mucous membranes are moist. No oral lesions.     Dentition: Normal dentition.     Tongue: No lesions.     Palate: No mass.     Pharynx: Oropharynx is clear. Uvula midline.  Eyes:     General: Lids are normal. Vision grossly intact. Gaze aligned appropriately. No scleral icterus.       Left eye: No foreign body or hordeolum.     Extraocular Movements: Extraocular movements intact.     Conjunctiva/sclera: Conjunctivae normal.     Right eye: Right conjunctiva is not injected.     Left eye: Left conjunctiva is not injected.     Pupils: Pupils are equal, round, and reactive to light.     Funduscopic exam:    Right eye: Red reflex present.        Left eye: Red reflex  present. Neck:     Thyroid: No thyroid mass, thyromegaly or thyroid tenderness.     Vascular: Normal carotid pulses. No carotid bruit, hepatojugular reflux or JVD.     Trachea: Trachea and phonation normal. No tracheal deviation.  Cardiovascular:     Rate and Rhythm: Normal rate and regular rhythm.     Chest Wall: PMI is not displaced. No thrill.     Pulses: Normal pulses.          Carotid pulses are 2+ on the right side and 2+ on the left side.      Radial pulses are 2+ on the right side and 2+ on the left side.       Femoral pulses are 2+ on the right side and 2+ on the left side.      Popliteal pulses are 2+ on the right side and 2+ on the left side.       Dorsalis pedis pulses are 2+ on the right side and 2+ on the left side.       Posterior tibial pulses are 2+ on the right side and 2+ on the left side.     Heart sounds: Normal heart sounds, S1 normal and S2 normal. No murmur heard.    No systolic murmur is present.     No diastolic murmur is present.     No friction rub. No gallop. No S3 or S4 sounds.  Pulmonary:     Effort: Pulmonary effort is normal. No respiratory distress.     Breath sounds: Normal breath sounds. No decreased breath sounds, wheezing, rhonchi or rales.  Chest:     Chest wall: No mass.  Breasts:    Breasts are symmetrical.     Right: No swelling, bleeding, inverted nipple, mass, nipple discharge, skin change or tenderness.     Left: No swelling, bleeding, inverted nipple, mass, nipple  discharge, skin change or tenderness.  Abdominal:     General: Bowel sounds are normal.     Palpations: Abdomen is soft. There is no hepatomegaly, splenomegaly or mass.     Tenderness: There is no abdominal tenderness. There is no guarding or rebound.     Hernia: There is no hernia in the left inguinal area or right inguinal area.  Genitourinary:    Exam position: Lithotomy position.     Labia:        Right: No rash, tenderness or lesion.        Left: No rash, tenderness or  lesion.      Vagina: Normal.     Cervix: Normal.     Uterus: Normal.      Adnexa: Right adnexa normal and left adnexa normal.     Rectum: Normal. Guaiac result negative. No mass.  Musculoskeletal:        General: No tenderness. Normal range of motion.     Cervical back: Normal, full passive range of motion without pain, normal range of motion and neck supple.     Thoracic back: Normal.     Lumbar back: Normal.     Right lower leg: 1+ Pitting Edema present.     Left lower leg: 1+ Pitting Edema present.  Lymphadenopathy:     Head:     Right side of head: No submental or submandibular adenopathy.     Left side of head: No submental or submandibular adenopathy.     Cervical: No cervical adenopathy.     Right cervical: No superficial, deep or posterior cervical adenopathy.    Left cervical: No superficial, deep or posterior cervical adenopathy.     Upper Body:     Right upper body: No supraclavicular or axillary adenopathy.     Left upper body: No supraclavicular or axillary adenopathy.     Lower Body: No right inguinal adenopathy. No left inguinal adenopathy.  Skin:    General: Skin is warm.     Capillary Refill: Capillary refill takes less than 2 seconds.     Findings: No rash.  Neurological:     Mental Status: She is alert and oriented to person, place, and time.     Cranial Nerves: No cranial nerve deficit.     Sensory: Sensation is intact.     Motor: Motor function is intact.     Deep Tendon Reflexes: Reflexes are normal and symmetric. Reflexes normal.     Reflex Scores:      Tricep reflexes are 2+ on the right side and 2+ on the left side.      Bicep reflexes are 2+ on the right side and 2+ on the left side.      Brachioradialis reflexes are 2+ on the right side and 2+ on the left side.      Patellar reflexes are 2+ on the right side and 2+ on the left side.      Achilles reflexes are 2+ on the right side and 2+ on the left side. Psychiatric:        Mood and Affect: Mood is  not anxious or depressed.        Behavior: Behavior is cooperative.     Wt Readings from Last 3 Encounters:  09/26/21 218 lb (98.9 kg)  09/01/21 218 lb (98.9 kg)  06/14/21 190 lb (86.2 kg)    BP 120/82   Pulse 60   Ht 5' 4"  (1.626 m)   Wt 218 lb (98.9  kg)   LMP  (LMP Unknown) Comment: menopausal, denies preg  BMI 37.42 kg/m   Assessment and Plan:  1. Annual physical exam Susan Gregory is a 61 y.o. female who presents today for her Complete Annual Exam. She feels well. She reports exercising . She reports she is sleeping well.  Immunizations are reviewed and recommendations provided.   Age appropriate screening tests are discussed. Counseling given for risk factor reduction interventions.  No subjective-objective concerns noted during review of past medical history, reviewofpresentillness, review of systems, and physical exam. Labs were reviewed from previous and is not necessary.  Pap smear was done and pelvic exam was normal with no abnormalities. 2. Cervical cancer screening Pap was done/pelvic exam was normal.  Pap sent for cytology and HPV. - Cytology - PAP  3. Colon cancer screening Stool guaiac negative. - POCT occult blood stool    Otilio Miu, MD

## 2021-09-30 LAB — CYTOLOGY - PAP
Adequacy: ABSENT
Comment: NEGATIVE
Diagnosis: NEGATIVE
High risk HPV: NEGATIVE

## 2021-12-03 DIAGNOSIS — H04123 Dry eye syndrome of bilateral lacrimal glands: Secondary | ICD-10-CM | POA: Diagnosis not present

## 2021-12-03 DIAGNOSIS — H35033 Hypertensive retinopathy, bilateral: Secondary | ICD-10-CM | POA: Diagnosis not present

## 2021-12-03 DIAGNOSIS — D860 Sarcoidosis of lung: Secondary | ICD-10-CM | POA: Diagnosis not present

## 2021-12-03 DIAGNOSIS — Z961 Presence of intraocular lens: Secondary | ICD-10-CM | POA: Diagnosis not present

## 2021-12-05 ENCOUNTER — Other Ambulatory Visit: Payer: Self-pay | Admitting: Family Medicine

## 2021-12-05 DIAGNOSIS — I10 Essential (primary) hypertension: Secondary | ICD-10-CM

## 2022-01-06 DIAGNOSIS — D860 Sarcoidosis of lung: Secondary | ICD-10-CM | POA: Diagnosis not present

## 2022-02-28 ENCOUNTER — Other Ambulatory Visit: Payer: Self-pay | Admitting: Family Medicine

## 2022-02-28 DIAGNOSIS — I10 Essential (primary) hypertension: Secondary | ICD-10-CM

## 2022-03-04 ENCOUNTER — Ambulatory Visit: Payer: BC Managed Care – PPO | Admitting: Family Medicine

## 2022-03-04 ENCOUNTER — Encounter: Payer: Self-pay | Admitting: Family Medicine

## 2022-03-04 ENCOUNTER — Ambulatory Visit (INDEPENDENT_AMBULATORY_CARE_PROVIDER_SITE_OTHER): Payer: BC Managed Care – PPO | Admitting: Family Medicine

## 2022-03-04 VITALS — BP 110/70 | HR 72 | Ht 64.0 in | Wt 215.0 lb

## 2022-03-04 DIAGNOSIS — R69 Illness, unspecified: Secondary | ICD-10-CM | POA: Diagnosis not present

## 2022-03-04 DIAGNOSIS — E7849 Other hyperlipidemia: Secondary | ICD-10-CM | POA: Diagnosis not present

## 2022-03-04 DIAGNOSIS — I10 Essential (primary) hypertension: Secondary | ICD-10-CM | POA: Diagnosis not present

## 2022-03-04 DIAGNOSIS — J4521 Mild intermittent asthma with (acute) exacerbation: Secondary | ICD-10-CM | POA: Diagnosis not present

## 2022-03-04 MED ORDER — HYDROCHLOROTHIAZIDE 50 MG PO TABS: 50.0000 mg | ORAL_TABLET | Freq: Every day | ORAL | 1 refills | Status: DC

## 2022-03-04 MED ORDER — ALBUTEROL SULFATE HFA 108 (90 BASE) MCG/ACT IN AERS: INHALATION_SPRAY | RESPIRATORY_TRACT | 1 refills | Status: DC

## 2022-03-04 MED ORDER — AMLODIPINE BESYLATE 10 MG PO TABS: 10.0000 mg | ORAL_TABLET | Freq: Every day | ORAL | 1 refills | Status: DC

## 2022-03-04 MED ORDER — LOSARTAN POTASSIUM 25 MG PO TABS: 25.0000 mg | ORAL_TABLET | Freq: Two times a day (BID) | ORAL | 1 refills | Status: DC

## 2022-03-04 MED ORDER — SIMVASTATIN 20 MG PO TABS: 20.0000 mg | ORAL_TABLET | Freq: Every day | ORAL | 1 refills | Status: DC

## 2022-03-04 NOTE — Progress Notes (Signed)
Date:  03/04/2022   Name:  Susan Gregory   DOB:  October 26, 1960   MRN:  [id number]   Chief Complaint: Hyperlipidemia, Hypertension, and Asthma  Hyperlipidemia This is a chronic problem. The current episode started more than 1 year ago. The problem is controlled. Recent lipid tests were reviewed and are normal. Exacerbating diseases include obesity. She has no history of chronic renal disease, diabetes, hypothyroidism, liver disease or nephrotic syndrome. Pertinent negatives include no chest pain, myalgias or shortness of breath. Current antihyperlipidemic treatment includes statins. The current treatment provides moderate improvement of lipids.  Hypertension This is a chronic problem. The current episode started more than 1 year ago. The problem has been gradually improving since onset. The problem is controlled. Pertinent negatives include no blurred vision, chest pain, headaches, orthopnea, palpitations, peripheral edema, PND or shortness of breath. Past treatments include angiotensin blockers, calcium channel blockers and diuretics. The current treatment provides moderate improvement. There are no compliance problems.  There is no history of angina, kidney disease, CAD/MI, CVA, heart failure, left ventricular hypertrophy, PVD or retinopathy. There is no history of chronic renal disease, a hypertension causing med or renovascular disease.  Asthma She complains of frequent throat clearing. There is no chest tightness, cough, shortness of breath or wheezing. This is a new problem. The current episode started more than 1 year ago. The problem has been waxing and waning. Pertinent negatives include no chest pain, dyspnea on exertion, ear congestion, fever, headaches, myalgias, PND, rhinorrhea, sneezing or sore throat. Her past medical history is significant for asthma.    Lab Results  Component Value Date   NA 145 (H) 09/01/2021   K 3.7 09/01/2021   CO2 28 09/01/2021   GLUCOSE 89 09/01/2021   BUN  12 09/01/2021   CREATININE 0.83 09/01/2021   CALCIUM 9.7 09/01/2021   EGFR 79 09/01/2021   GFRNONAA 73 03/27/2020   Lab Results  Component Value Date   CHOL 171 09/01/2021   HDL 58 09/01/2021   LDLCALC 100 (H) 09/01/2021   TRIG 65 09/01/2021   CHOLHDL 3.9 09/26/2019   No results found for: "TSH" No results found for: "HGBA1C" Lab Results  Component Value Date   WBC 3.5 (L) 08/15/2017   HGB 14.5 08/15/2017   HCT 42.2 08/15/2017   MCV 86.6 08/15/2017   PLT 181 08/15/2017   Lab Results  Component Value Date   ALT 23 03/27/2020   AST 22 03/27/2020   ALKPHOS 75 03/27/2020   BILITOT 0.4 03/27/2020   No results found for: "25OHVITD2", "25OHVITD3", "VD25OH"   Review of Systems  Constitutional: Negative.  Negative for chills, fatigue, fever and unexpected weight change.  HENT:  Negative for congestion, rhinorrhea, sinus pressure, sneezing and sore throat.   Eyes:  Negative for blurred vision.  Respiratory:  Negative for cough, shortness of breath, wheezing and stridor.   Cardiovascular:  Negative for chest pain, dyspnea on exertion, palpitations, orthopnea, leg swelling and PND.  Gastrointestinal:  Negative for abdominal pain, blood in stool, constipation, diarrhea and nausea.  Genitourinary:  Negative for difficulty urinating, dysuria, flank pain and frequency.  Musculoskeletal:  Negative for arthralgias, back pain and myalgias.  Skin:  Negative for rash.  Neurological:  Negative for dizziness, weakness and headaches.  Hematological:  Negative for adenopathy. Does not bruise/bleed easily.  Psychiatric/Behavioral:  Negative for dysphoric mood. The patient is not nervous/anxious.     Patient Active Problem List   Diagnosis Date Noted   Gastroesophageal reflux disease  08/29/2020   BMI 35.0-35.9,adult 08/29/2020   Abnormal CT of the chest 11/22/2019   Familial multiple lipoprotein-type hyperlipidemia 07/18/2014   Disorder of lipoprotein and lipid metabolism 07/18/2014    Benign hypertension 07/18/2014   Acid reflux 07/18/2014   Encounter for general adult medical examination without abnormal findings 07/18/2014   Neck sprain and strain 07/18/2014   Abnormal breast finding 07/18/2014   Abnormal finding on breast imaging 07/18/2014   Arthritis of knee, degenerative 07/18/2014   TB skin/subcutaneous 07/18/2014   Fecal occult blood test positive 11/01/2013    No Known Allergies  Past Surgical History:  Procedure Laterality Date   BREAST BIOPSY Right 09/04/2016   US guided right breast biopsy. Coil marker neg   BREAST BIOPSY Right 09/07/2016   stereo biopsy. Ribbon marker neg   COLONOSCOPY     COLONOSCOPY WITH PROPOFOL N/A 11/18/2018   Procedure: COLONOSCOPY WITH PROPOFOL;  Surgeon: Christena Deem, MD;  Location: [facility] ENDOSCOPY;  Service: Endoscopy;  Laterality: N/A;   ESOPHAGOGASTRODUODENOSCOPY (EGD) WITH PROPOFOL N/A 04/20/2016   Procedure: ESOPHAGOGASTRODUODENOSCOPY (EGD) WITH PROPOFOL;  Surgeon: Christena Deem, MD;  Location: [facility] ENDOSCOPY;  Service: Endoscopy;  Laterality: N/A;   ESOPHAGOGASTRODUODENOSCOPY (EGD) WITH PROPOFOL N/A 11/18/2018   Procedure: ESOPHAGOGASTRODUODENOSCOPY (EGD) WITH PROPOFOL;  Surgeon: Christena Deem, MD;  Location: [facility] ENDOSCOPY;  Service: Endoscopy;  Laterality: N/A;   THROAT SURGERY     nodules taken off throat   TUBAL LIGATION      Social History   Tobacco Use   Smoking status: Never   Smokeless tobacco: Never  Vaping Use   Vaping Use: Never used  Substance Use Topics   Alcohol use: No    Alcohol/week: 0.0 standard drinks of alcohol   Drug use: No     Medication list has been reviewed and updated.  Current Meds  Medication Sig   acetaminophen (TYLENOL) 500 MG tablet Take 1,000 mg by mouth every 6 (six) hours as needed for moderate pain or headache.   albuterol (VENTOLIN HFA) 108 (90 Base) MCG/ACT inhaler INHALE 2 PUFFS EVERY 6 HOURS AS NEEDED FOR WHEEZING   amLODipine (NORVASC) 10 MG tablet  Take 1 tablet (10 mg total) by mouth daily.   aspirin 81 MG tablet Take 81 mg by mouth daily.    Carboxymethylcellul-Glycerin (CLEAR EYES FOR DRY EYES OP) Place 1 drop into both eyes daily as needed (dry eyes).   folic acid (FOLVITE) 1 MG tablet Take 1 mg by mouth daily. rheumatology   griseofulvin (GRIFULVIN V) 500 MG tablet Take 500 mg by mouth 2 (two) times daily as needed (fungus). Dr Cheree Ditto   hydrochlorothiazide (HYDRODIURIL) 50 MG tablet TAKE 1 TABLET BY MOUTH EVERY DAY   ketoconazole (NIZORAL) 2 % shampoo Apply 1 application topically daily as needed (fungus).   losartan (COZAAR) 25 MG tablet Take 1 tablet (25 mg total) by mouth in the morning and at bedtime.   methotrexate (RHEUMATREX) 2.5 MG tablet Take 10 mg by mouth once a week. Rheumatology   Multiple Vitamin (MULTIVITAMIN WITH MINERALS) TABS tablet Take 1 tablet by mouth 2 (two) times a week.   pantoprazole (PROTONIX) 40 MG tablet TAKE 1 TABLET (40 MG TOTAL) BY MOUTH DAILY. DR Marva Panda   simvastatin (ZOCOR) 20 MG tablet Take 1 tablet (20 mg total) by mouth daily.   [DISCONTINUED] ipratropium (ATROVENT) 0.06 % nasal spray Place 2 sprays into both nostrils 4 (four) times daily.       03/04/2022   11:48 AM 09/26/2021  9:22 AM 09/01/2021   10:23 AM 04/15/2021   11:57 AM  GAD 7 : Generalized Anxiety Score  Nervous, Anxious, on Edge 0 0 0 0  Control/stop worrying 0 0 0 0  Worry too much - different things 0 0 0 0  Trouble relaxing 0 0 0 0  Restless 0 0 0 0  Easily annoyed or irritable 0 0 0 0  Afraid - awful might happen 0 0 0 0  Total GAD 7 Score 0 0 0 0  Anxiety Difficulty Not difficult at all Not difficult at all Not difficult at all Not difficult at all       03/04/2022   11:48 AM 09/26/2021    9:21 AM 09/01/2021   10:22 AM  Depression screen PHQ 2/9  Decreased Interest 0 0 0  Down, Depressed, Hopeless 0 0 0  PHQ - 2 Score 0 0 0  Altered sleeping 0 0 0  Tired, decreased energy 0 0 0  Change in appetite 0 0 0  Feeling  bad or failure about yourself  0 0 0  Trouble concentrating 0 0 0  Moving slowly or fidgety/restless 0 0 0  Suicidal thoughts 0 0 0  PHQ-9 Score 0 0 0  Difficult doing work/chores Not difficult at all Not difficult at all Not difficult at all    BP Readings from Last 3 Encounters:  03/04/22 110/70  09/26/21 120/82  09/01/21 120/80    Physical Exam Vitals and nursing note reviewed. Exam conducted with a chaperone present.  Constitutional:      General: She is not in acute distress.    Appearance: She is not diaphoretic.  HENT:     Head: Normocephalic and atraumatic.     Right Ear: Tympanic membrane and external ear normal.     Left Ear: Tympanic membrane and external ear normal.     Nose: Nose normal.     Mouth/Throat:     Mouth: Mucous membranes are moist.  Eyes:     General:        Right eye: No discharge.        Left eye: No discharge.     Conjunctiva/sclera: Conjunctivae normal.     Pupils: Pupils are equal, round, and reactive to light.  Neck:     Thyroid: No thyromegaly.     Vascular: No JVD.  Cardiovascular:     Rate and Rhythm: Normal rate and regular rhythm.     Heart sounds: Normal heart sounds. No murmur heard.    No friction rub. No gallop.  Pulmonary:     Effort: Pulmonary effort is normal.     Breath sounds: Normal breath sounds. No wheezing or rhonchi.  Abdominal:     General: Bowel sounds are normal.     Palpations: Abdomen is soft. There is no mass.     Tenderness: There is no abdominal tenderness. There is no guarding or rebound.  Musculoskeletal:        General: Normal range of motion.     Cervical back: Normal range of motion and neck supple.  Lymphadenopathy:     Cervical: No cervical adenopathy.  Skin:    General: Skin is warm and dry.  Neurological:     Mental Status: She is alert.     Wt Readings from Last 3 Encounters:  03/04/22 215 lb (97.5 kg)  09/26/21 218 lb (98.9 kg)  09/01/21 218 lb (98.9 kg)    BP 110/70   Pulse 72   Ht  5\' 4"  (1.626 m)   Wt 215 lb (97.5 kg)   LMP  (LMP Unknown) Comment: menopausal, denies preg  SpO2 96%   BMI 36.90 kg/m   Assessment and Plan:  1. Benign hypertension Chronic.  Controlled.  Stable.  Blood pressure 110/70.  Continue amlodipine 10 mg once a day, hydrochlorothiazide 50 mg once a day, and losartan 25 mg once a day.  Will check CMP for electrolytes and GFR. - amLODipine (NORVASC) 10 MG tablet; Take 1 tablet (10 mg total) by mouth daily.  Dispense: 90 tablet; Refill: 1 - hydrochlorothiazide (HYDRODIURIL) 50 MG tablet; Take 1 tablet (50 mg total) by mouth daily.  Dispense: 90 tablet; Refill: 1 - losartan (COZAAR) 25 MG tablet; Take 1 tablet (25 mg total) by mouth in the morning and at bedtime.  Dispense: 180 tablet; Refill: 1  2. Familial multiple lipoprotein-type hyperlipidemia Chronic.  Controlled.  Stable.  Continue simvastatin 20 mg once a day.  Will recheck lipid panel for current status of LDL. - Lipid Panel With LDL/HDL Ratio - simvastatin (ZOCOR) 20 MG tablet; Take 1 tablet (20 mg total) by mouth daily.  Dispense: 90 tablet; Refill: 1  3. Reactive airway disease, mild intermittent, with acute exacerbation .  Controlled.  Stable.  Continue albuterol inhaler 2 puffs every 6 hours as needed for wheezing. - albuterol (VENTOLIN HFA) 108 (90 Base) MCG/ACT inhaler; INHALE 2 PUFFS EVERY 6 HOURS AS NEEDED FOR WHEEZING  Dispense: 6.7 each; Refill: 1  4. Taking medication for chronic disease Patient currently on a statin and we will check CMP for transaminases and liver concerns. - Comprehensive Metabolic Panel (CMET) - simvastatin (ZOCOR) 20 MG tablet; Take 1 tablet (20 mg total) by mouth daily.  Dispense: 90 tablet; Refill: 1    Otilio Miu, MD

## 2022-03-04 NOTE — Patient Instructions (Signed)

## 2022-03-05 LAB — COMPREHENSIVE METABOLIC PANEL
ALT: 18 IU/L (ref 0–32)
AST: 16 IU/L (ref 0–40)
Albumin/Globulin Ratio: 1.6 (ref 1.2–2.2)
Albumin: 4.5 g/dL (ref 3.9–4.9)
Alkaline Phosphatase: 76 IU/L (ref 44–121)
BUN/Creatinine Ratio: 18 (ref 12–28)
BUN: 14 mg/dL (ref 8–27)
Bilirubin Total: 0.5 mg/dL (ref 0.0–1.2)
CO2: 24 mmol/L (ref 20–29)
Calcium: 9.9 mg/dL (ref 8.7–10.3)
Chloride: 102 mmol/L (ref 96–106)
Creatinine, Ser: 0.76 mg/dL (ref 0.57–1.00)
Globulin, Total: 2.9 g/dL (ref 1.5–4.5)
Glucose: 96 mg/dL (ref 70–99)
Potassium: 3.8 mmol/L (ref 3.5–5.2)
Sodium: 142 mmol/L (ref 134–144)
Total Protein: 7.4 g/dL (ref 6.0–8.5)
eGFR: 88 mL/min/{1.73_m2} (ref >59)

## 2022-03-05 LAB — LIPID PANEL WITH LDL/HDL RATIO
Cholesterol, Total: 188 mg/dL (ref 100–199)
HDL: 60 mg/dL (ref >39)
LDL Chol Calc (NIH): 116 mg/dL — ABNORMAL HIGH (ref 0–99)
LDL/HDL Ratio: 1.9 ratio (ref 0.0–3.2)
Triglycerides: 66 mg/dL (ref 0–149)
VLDL Cholesterol Cal: 12 mg/dL (ref 5–40)

## 2022-03-09 DIAGNOSIS — R899 Unspecified abnormal finding in specimens from other organs, systems and tissues: Secondary | ICD-10-CM | POA: Diagnosis not present

## 2022-03-09 DIAGNOSIS — R9389 Abnormal findings on diagnostic imaging of other specified body structures: Secondary | ICD-10-CM | POA: Diagnosis not present

## 2022-03-09 DIAGNOSIS — D860 Sarcoidosis of lung: Secondary | ICD-10-CM | POA: Diagnosis not present

## 2022-03-09 DIAGNOSIS — Z7960 Long term (current) use of unspecified immunomodulators and immunosuppressants: Secondary | ICD-10-CM | POA: Diagnosis not present

## 2022-04-01 ENCOUNTER — Ambulatory Visit
Admission: EM | Admit: 2022-04-01 | Discharge: 2022-04-01 | Disposition: A | Discharge status: Home / Self Care | Payer: BC Managed Care – PPO | Attending: Emergency Medicine | Admitting: Emergency Medicine

## 2022-04-01 ENCOUNTER — Encounter: Payer: Self-pay | Admitting: Emergency Medicine

## 2022-04-01 ENCOUNTER — Ambulatory Visit (INDEPENDENT_AMBULATORY_CARE_PROVIDER_SITE_OTHER): Payer: BC Managed Care – PPO

## 2022-04-01 DIAGNOSIS — J069 Acute upper respiratory infection, unspecified: Secondary | ICD-10-CM

## 2022-04-01 DIAGNOSIS — R059 Cough, unspecified: Secondary | ICD-10-CM | POA: Diagnosis not present

## 2022-04-01 DIAGNOSIS — J479 Bronchiectasis, uncomplicated: Secondary | ICD-10-CM | POA: Diagnosis not present

## 2022-04-01 DIAGNOSIS — R051 Acute cough: Secondary | ICD-10-CM

## 2022-04-01 MED ORDER — BENZONATATE 100 MG PO CAPS: 200.0000 mg | ORAL_CAPSULE | Freq: Three times a day (TID) | ORAL | 0 refills | Status: DC

## 2022-04-01 MED ORDER — PROMETHAZINE-DM 6.25-15 MG/5ML PO SYRP: 5.0000 mL | ORAL_SOLUTION | Freq: Four times a day (QID) | ORAL | 0 refills | Status: DC | PRN

## 2022-04-01 MED ORDER — AMOXICILLIN-POT CLAVULANATE 875-125 MG PO TABS: 1.0000 | ORAL_TABLET | Freq: Two times a day (BID) | ORAL | 0 refills | Status: AC

## 2022-04-01 NOTE — ED Triage Notes (Signed)
Pt c/o cough, and sputum production. Started about a week ago. She states she does not feel sick. The cough is keeping her up at night.

## 2022-04-01 NOTE — ED Provider Notes (Signed)
[facility]    CSN: [id number] Arrival date & time: 04/01/22  1125      History   Chief Complaint Chief Complaint  Patient presents with   Cough    HPI Susan Gregory is a 62 y.o. female.   HPI  62 year old female here for evaluation of respiratory complaints.  The patient reports that she has been experiencing some runny nose and nasal congestion and a cough that is productive for a dark green mucus.  She denies any shortness of breath or wheezing and she denies sore throat or fever.  Her symptoms have been present for a week and her sputum is beginning to become darker so she came in for evaluation.  She is unaware of any sick contacts though she has been around her grandchildren and also been around people who have been coughing at church.  Past Medical History:  Diagnosis Date   Allergy    Barrett's esophagus    GERD (gastroesophageal reflux disease)    Hyperlipidemia    Hypertension     Patient Active Problem List   Diagnosis Date Noted   Gastroesophageal reflux disease 08/29/2020   BMI 35.0-35.9,adult 08/29/2020   Abnormal CT of the chest 11/22/2019   Familial multiple lipoprotein-type hyperlipidemia 07/18/2014   Disorder of lipoprotein and lipid metabolism 07/18/2014   Benign hypertension 07/18/2014   Acid reflux 07/18/2014   Encounter for general adult medical examination without abnormal findings 07/18/2014   Neck sprain and strain 07/18/2014   Abnormal breast finding 07/18/2014   Abnormal finding on breast imaging 07/18/2014   Arthritis of knee, degenerative 07/18/2014   TB skin/subcutaneous 07/18/2014   Fecal occult blood test positive 11/01/2013    Past Surgical History:  Procedure Laterality Date   BREAST BIOPSY Right 09/04/2016   US guided right breast biopsy. Coil marker neg   BREAST BIOPSY Right 09/07/2016   stereo biopsy. Ribbon marker neg   COLONOSCOPY     COLONOSCOPY WITH PROPOFOL N/A 11/18/2018   Procedure: COLONOSCOPY WITH  PROPOFOL;  Surgeon: Lollie Sails, MD;  Location: [facility] ENDOSCOPY;  Service: Endoscopy;  Laterality: N/A;   ESOPHAGOGASTRODUODENOSCOPY (EGD) WITH PROPOFOL N/A 04/20/2016   Procedure: ESOPHAGOGASTRODUODENOSCOPY (EGD) WITH PROPOFOL;  Surgeon: Lollie Sails, MD;  Location: [facility] ENDOSCOPY;  Service: Endoscopy;  Laterality: N/A;   ESOPHAGOGASTRODUODENOSCOPY (EGD) WITH PROPOFOL N/A 11/18/2018   Procedure: ESOPHAGOGASTRODUODENOSCOPY (EGD) WITH PROPOFOL;  Surgeon: Lollie Sails, MD;  Location: [facility] ENDOSCOPY;  Service: Endoscopy;  Laterality: N/A;   THROAT SURGERY     nodules taken off throat   TUBAL LIGATION      OB History   No obstetric history on file.      Home Medications    Prior to Admission medications   Medication Sig Start Date End Date Taking? Authorizing Provider  amLODipine (NORVASC) 10 MG tablet Take 1 tablet (10 mg total) by mouth daily. 03/04/22  Yes Juline Patch, MD  amoxicillin-clavulanate (AUGMENTIN) 875-125 MG tablet Take 1 tablet by mouth every 12 (twelve) hours for 7 days. 04/01/22 04/08/22 Yes Margarette Canada, NP  aspirin 81 MG tablet Take 81 mg by mouth daily.    Yes [provider]  benzonatate (TESSALON) 100 MG capsule Take 2 capsules (200 mg total) by mouth every 8 (eight) hours. 04/01/22  Yes Margarette Canada, NP  griseofulvin (GRIFULVIN V) 500 MG tablet Take 500 mg by mouth 2 (two) times daily as needed (fungus). Dr Phillip Heal   Yes [provider]  hydrochlorothiazide (HYDRODIURIL) 50  MG tablet Take 1 tablet (50 mg total) by mouth daily. 03/04/22  Yes Juline Patch, MD  losartan (COZAAR) 25 MG tablet Take 1 tablet (25 mg total) by mouth in the morning and at bedtime. 03/04/22  Yes Juline Patch, MD  methotrexate (RHEUMATREX) 2.5 MG tablet Take 10 mg by mouth once a week. Rheumatology 02/23/21  Yes [provider]  Multiple Vitamin (MULTIVITAMIN WITH MINERALS) TABS tablet Take 1 tablet by mouth 2 (two) times a week.   Yes [provider]  pantoprazole (PROTONIX) 40 MG tablet TAKE 1 TABLET (40 MG TOTAL) BY MOUTH DAILY. DR Gustavo Lah 12/11/20  Yes Juline Patch, MD  promethazine-dextromethorphan (PROMETHAZINE-DM) 6.25-15 MG/5ML syrup Take 5 mLs by mouth 4 (four) times daily as needed. 04/01/22  Yes Margarette Canada, NP  simvastatin (ZOCOR) 20 MG tablet Take 1 tablet (20 mg total) by mouth daily. 03/04/22  Yes Juline Patch, MD  acetaminophen (TYLENOL) 500 MG tablet Take 1,000 mg by mouth every 6 (six) hours as needed for moderate pain or headache.    [provider]  albuterol (VENTOLIN HFA) 108 (90 Base) MCG/ACT inhaler INHALE 2 PUFFS EVERY 6 HOURS AS NEEDED FOR WHEEZING 03/04/22   Juline Patch, MD  Carboxymethylcellul-Glycerin (CLEAR EYES FOR DRY EYES OP) Place 1 drop into both eyes daily as needed (dry eyes).    [provider]  clobetasol (TEMOVATE) 0.05 % external solution APPLY TO AFFECTED AREA TWICE A DAY Patient not taking: Reported on 03/04/2022 03/13/19   Juline Patch, MD  folic acid (FOLVITE) 1 MG tablet Take 1 mg by mouth daily. rheumatology 12/29/20   [provider]  ketoconazole (NIZORAL) 2 % shampoo Apply 1 application topically daily as needed (fungus). 03/14/19   [provider]    Family History Family History  Problem Relation Age of Onset   Breast cancer Cousin        mat cousin    Social History Social History   Tobacco Use   Smoking status: Never   Smokeless tobacco: Never  Vaping Use   Vaping Use: Never used  Substance Use Topics   Alcohol use: No    Alcohol/week: 0.0 standard drinks of alcohol   Drug use: No     Allergies   Patient has no known allergies.   Review of Systems Review of Systems  Constitutional:  Negative for fever.  HENT:  Positive for congestion and rhinorrhea. Negative for ear pain and sore throat.   Respiratory:  Positive for cough. Negative for shortness of breath and wheezing.      Physical Exam Triage Vital  Signs ED Triage Vitals  Enc Vitals Group     BP      Pulse      Resp      Temp      Temp src      SpO2      Weight      Height      Head Circumference      Peak Flow      Pain Score      Pain Loc      Pain Edu?      Excl. in [place]?    No data found.  Updated Vital Signs BP (!) 149/87 (BP Location: Right Arm)   Pulse 73   Temp 98.6 F (37 C) (Oral)   Resp 18   Ht 5' 4"$  (1.626 m)   Wt 214 lb 15.2 oz (97.5 kg)  LMP  (LMP Unknown) Comment: menopausal, denies preg  SpO2 95%   BMI 36.90 kg/m   Visual Acuity Right Eye Distance:   Left Eye Distance:   Bilateral Distance:    Right Eye Near:   Left Eye Near:    Bilateral Near:     Physical Exam Vitals and nursing note reviewed.  Constitutional:      Appearance: Normal appearance. She is not ill-appearing.  HENT:     Head: Normocephalic and atraumatic.     Right Ear: Tympanic membrane, ear canal and external ear normal. There is no impacted cerumen.     Left Ear: Tympanic membrane, ear canal and external ear normal. There is no impacted cerumen.     Nose: Congestion and rhinorrhea present.     Comments: Please mucosa is erythematous and edematous with scant clear discharge in both nares.    Mouth/Throat:     Mouth: Mucous membranes are moist.     Pharynx: Oropharynx is clear. No oropharyngeal exudate or posterior oropharyngeal erythema.  Cardiovascular:     Rate and Rhythm: Normal rate and regular rhythm.     Pulses: Normal pulses.     Heart sounds: Normal heart sounds. No murmur heard.    No friction rub. No gallop.  Pulmonary:     Effort: Pulmonary effort is normal.     Breath sounds: Wheezing present. No rhonchi or rales.     Comments: Patient has expiratory wheezing in the upper and middle aspect of the left lung field posteriorly. Musculoskeletal:     Cervical back: Normal range of motion and neck supple.  Lymphadenopathy:     Cervical: No cervical adenopathy.  Skin:    General: Skin is warm and dry.      Capillary Refill: Capillary refill takes less than 2 seconds.     Findings: No erythema or rash.  Neurological:     General: No focal deficit present.     Mental Status: She is alert and oriented to person, place, and time.  Psychiatric:        Mood and Affect: Mood normal.        Behavior: Behavior normal.        Thought Content: Thought content normal.        Judgment: Judgment normal.      UC Treatments / Results  Labs (all labs ordered are listed, but only abnormal results are displayed) Labs Reviewed - No data to display  EKG   Radiology DG Chest 2 View  Result Date: 04/01/2022 CLINICAL DATA:  Cough. EXAM: CHEST - 2 VIEW COMPARISON:  Chest radiographs 04/15/2021 and CT 06/25/2021 FINDINGS: The cardiomediastinal silhouette is unchanged with normal heart size. Underlying chronic interstitial lung disease is again noted including chronic asymmetric densities in the left lung base with bronchiectasis. No definite acute airspace consolidation, overt pulmonary edema, pleural effusion, or pneumothorax is identified. Lower thoracic dextroscoliosis is noted. IMPRESSION: Chronic lung disease (history of sarcoidosis) without evidence of acute cardiopulmonary process. Electronically Signed   By: Logan Bores M.D.   On: 04/01/2022 13:31    Procedures Procedures (including critical care time)  Medications Ordered in UC Medications - No data to display  Initial Impression / Assessment and Plan / UC Course  I have reviewed the triage vital signs and the nursing notes.  Pertinent labs & imaging results that were available during my care of the patient were reviewed by me and considered in my medical decision making (see chart for details).  Patient is a pleasant, nontoxic-appearing 2 old female here for evaluation of productive cough that is been going on for the past week.  She reports that her sputum has gone from a lingering tomorrow gardening but she denies any shortness of breath or  wheezing.  She also denies fever.  She does have inflamed nasal mucosa with scant clear discharge in both nares.  Oropharyngeal exam is benign.  Cardiopulmonary exam does reveal expiratory wheezing in her left upper middle lung field posteriorly but the remainder of her lung fields are clear to auscultation.  I will obtain chest x-ray to rule out any acute cardiopulmonary process.  Radiology impression of chest x-ray states chronic lung disease without evidence of acute cardiopulmonary process.  I will treat patient for an upper respiratory infection and cough.  Given the fact that she has been experiencing worsening symptoms over the past 6 days I will do a trial of antibiotics Augmentin 875 twice daily for 7 days.  Along with Tessalon Perles and Promethazine DM cough syrup help with cough and congestion.   Final Clinical Impressions(s) / UC Diagnoses   Final diagnoses:  Upper respiratory tract infection, unspecified type  Acute cough     Discharge Instructions      Your chest x-ray did not show any evidence of pneumonia.  I do believe you have an upper respiratory infection and your nasal drainage is what is causing your cough.  Take the Augmentin 875 mg twice daily with food for 7 days for treatment of your upper respiratory infection.  Use the Tessalon Perles every 8 hours during the day as needed for cough and use the Promethazine DM cough syrup at bedtime as needed for cough and congestion.  If you develop any new or worsening symptoms of the return for reevaluation or see your primary care provider.     ED Prescriptions     Medication Sig Dispense Auth. Provider   amoxicillin-clavulanate (AUGMENTIN) 875-125 MG tablet Take 1 tablet by mouth every 12 (twelve) hours for 7 days. 14 tablet Margarette Canada, NP   benzonatate (TESSALON) 100 MG capsule Take 2 capsules (200 mg total) by mouth every 8 (eight) hours. 21 capsule Margarette Canada, NP   promethazine-dextromethorphan  (PROMETHAZINE-DM) 6.25-15 MG/5ML syrup Take 5 mLs by mouth 4 (four) times daily as needed. 118 mL Margarette Canada, NP      PDMP not reviewed this encounter.   Margarette Canada, NP 04/01/22 1336

## 2022-04-01 NOTE — Discharge Instructions (Addendum)
Your chest x-ray did not show any evidence of pneumonia.  I do believe you have an upper respiratory infection and your nasal drainage is what is causing your cough.  Take the Augmentin 875 mg twice daily with food for 7 days for treatment of your upper respiratory infection.  Use the Tessalon Perles every 8 hours during the day as needed for cough and use the Promethazine DM cough syrup at bedtime as needed for cough and congestion.  If you develop any new or worsening symptoms of the return for reevaluation or see your primary care provider.

## 2022-04-14 ENCOUNTER — Other Ambulatory Visit: Payer: Self-pay | Admitting: Family Medicine

## 2022-04-14 DIAGNOSIS — K219 Gastro-esophageal reflux disease without esophagitis: Secondary | ICD-10-CM

## 2022-04-14 NOTE — Telephone Encounter (Signed)
Requested Prescriptions  Pending Prescriptions Disp Refills   pantoprazole (PROTONIX) 40 MG tablet [Pharmacy Med Name: PANTOPRAZOLE SOD DR 40 MG TAB] 90 tablet 1    Sig: TAKE 1 TABLET (40 MG TOTAL) BY MOUTH DAILY. DR Gustavo Lah     Gastroenterology: Proton Pump Inhibitors Passed - 04/14/2022  1:47 AM      Passed - Valid encounter within last 12 months    Recent Outpatient Visits           1 month ago Benign hypertension   [facility] [place], Deanna C, MD   6 months ago Annual physical exam   [facility] [place], Deanna C, MD   7 months ago Benign hypertension   [facility] [place], Deanna C, MD   12 months ago Acute maxillary sinusitis, recurrence not specified   [facility] [place], Deanna C, MD   1 year ago Acute maxillary sinusitis, recurrence not specified   [facility] [place], Bradford [place], MD       Future Appointments             In 5 months Juline Patch, MD [place] [facility], [facility]

## 2022-04-30 DIAGNOSIS — L280 Lichen simplex chronicus: Secondary | ICD-10-CM | POA: Diagnosis not present

## 2022-04-30 DIAGNOSIS — L738 Other specified follicular disorders: Secondary | ICD-10-CM | POA: Diagnosis not present

## 2022-04-30 DIAGNOSIS — L608 Other nail disorders: Secondary | ICD-10-CM | POA: Diagnosis not present

## 2022-07-07 DIAGNOSIS — D860 Sarcoidosis of lung: Secondary | ICD-10-CM | POA: Diagnosis not present

## 2022-07-07 DIAGNOSIS — D869 Sarcoidosis, unspecified: Secondary | ICD-10-CM | POA: Diagnosis not present

## 2022-07-30 ENCOUNTER — Other Ambulatory Visit: Payer: Self-pay | Admitting: Family Medicine

## 2022-07-30 DIAGNOSIS — Z1231 Encounter for screening mammogram for malignant neoplasm of breast: Secondary | ICD-10-CM

## 2022-08-17 ENCOUNTER — Encounter: Payer: Self-pay | Admitting: Family Medicine

## 2022-08-17 ENCOUNTER — Ambulatory Visit (INDEPENDENT_AMBULATORY_CARE_PROVIDER_SITE_OTHER): Payer: BC Managed Care – PPO | Admitting: Family Medicine

## 2022-08-17 VITALS — BP 120/78 | HR 80 | Ht 64.0 in | Wt 219.0 lb

## 2022-08-17 DIAGNOSIS — J0100 Acute maxillary sinusitis, unspecified: Secondary | ICD-10-CM

## 2022-08-17 MED ORDER — AMOXICILLIN 500 MG PO CAPS: 500.0000 mg | ORAL_CAPSULE | Freq: Three times a day (TID) | ORAL | 0 refills | Status: AC

## 2022-08-17 NOTE — Progress Notes (Signed)
Date:  08/17/2022   Name:  Susan Gregory   DOB:  1960-08-01   MRN:  [id number]   Chief Complaint: Sore Throat (R) side - tender to the touch- ear and top of head on that side as well)  Sore Throat  This is a new problem. The current episode started yesterday. The problem has been unchanged. The pain is worse on the right side. There has been no fever. The pain is mild. Associated symptoms include congestion, ear pain and a hoarse voice. Pertinent negatives include no coughing, drooling, ear discharge, plugged ear sensation, shortness of breath, swollen glands or trouble swallowing. She has had no exposure to strep or mono. She has tried nothing for the symptoms. The treatment provided mild relief.    Lab Results  Component Value Date   NA 142 03/04/2022   K 3.8 03/04/2022   CO2 24 03/04/2022   GLUCOSE 96 03/04/2022   BUN 14 03/04/2022   CREATININE 0.76 03/04/2022   CALCIUM 9.9 03/04/2022   EGFR 88 03/04/2022   GFRNONAA 73 03/27/2020   Lab Results  Component Value Date   CHOL 188 03/04/2022   HDL 60 03/04/2022   LDLCALC 116 (H) 03/04/2022   TRIG 66 03/04/2022   CHOLHDL 3.9 09/26/2019   No results found for: "TSH" No results found for: "HGBA1C" Lab Results  Component Value Date   WBC 3.5 (L) 08/15/2017   HGB 14.5 08/15/2017   HCT 42.2 08/15/2017   MCV 86.6 08/15/2017   PLT 181 08/15/2017   Lab Results  Component Value Date   ALT 18 03/04/2022   AST 16 03/04/2022   ALKPHOS 76 03/04/2022   BILITOT 0.5 03/04/2022   No results found for: "25OHVITD2", "25OHVITD3", "VD25OH"   Review of Systems  Constitutional:  Negative for chills, fatigue and fever.  HENT:  Positive for congestion, ear pain, hoarse voice and sore throat. Negative for drooling, ear discharge, nosebleeds, postnasal drip, rhinorrhea, sinus pressure, sinus pain, sneezing and trouble swallowing.   Eyes:  Negative for visual disturbance.  Respiratory:  Negative for cough, choking, chest tightness, shortness  of breath and wheezing.   Cardiovascular:  Negative for chest pain and palpitations.    Patient Active Problem List   Diagnosis Date Noted   Gastroesophageal reflux disease 08/29/2020   BMI 35.0-35.9,adult 08/29/2020   Abnormal CT of the chest 11/22/2019   Familial multiple lipoprotein-type hyperlipidemia 07/18/2014   Disorder of lipoprotein and lipid metabolism 07/18/2014   Benign hypertension 07/18/2014   Acid reflux 07/18/2014   Encounter for general adult medical examination without abnormal findings 07/18/2014   Neck sprain and strain 07/18/2014   Abnormal breast finding 07/18/2014   Abnormal finding on breast imaging 07/18/2014   Arthritis of knee, degenerative 07/18/2014   TB skin/subcutaneous 07/18/2014   Fecal occult blood test positive 11/01/2013    No Known Allergies  Past Surgical History:  Procedure Laterality Date   BREAST BIOPSY Right 09/04/2016   US guided right breast biopsy. Coil marker neg   BREAST BIOPSY Right 09/07/2016   stereo biopsy. Ribbon marker neg   COLONOSCOPY     COLONOSCOPY WITH PROPOFOL N/A 11/18/2018   Procedure: COLONOSCOPY WITH PROPOFOL;  Surgeon: Christena Deem, MD;  Location: [facility] ENDOSCOPY;  Service: Endoscopy;  Laterality: N/A;   ESOPHAGOGASTRODUODENOSCOPY (EGD) WITH PROPOFOL N/A 04/20/2016   Procedure: ESOPHAGOGASTRODUODENOSCOPY (EGD) WITH PROPOFOL;  Surgeon: Christena Deem, MD;  Location: [facility] ENDOSCOPY;  Service: Endoscopy;  Laterality: N/A;   ESOPHAGOGASTRODUODENOSCOPY (EGD) WITH  PROPOFOL N/A 11/18/2018   Procedure: ESOPHAGOGASTRODUODENOSCOPY (EGD) WITH PROPOFOL;  Surgeon: Christena Deem, MD;  Location: [facility] ENDOSCOPY;  Service: Endoscopy;  Laterality: N/A;   THROAT SURGERY     nodules taken off throat   TUBAL LIGATION      Social History   Tobacco Use   Smoking status: Never   Smokeless tobacco: Never  Vaping Use   Vaping Use: Never used  Substance Use Topics   Alcohol use: No    Alcohol/week: 0.0 standard drinks  of alcohol   Drug use: No     Medication list has been reviewed and updated.  Current Meds  Medication Sig   acetaminophen (TYLENOL) 500 MG tablet Take 1,000 mg by mouth every 6 (six) hours as needed for moderate pain or headache.   albuterol (VENTOLIN HFA) 108 (90 Base) MCG/ACT inhaler INHALE 2 PUFFS EVERY 6 HOURS AS NEEDED FOR WHEEZING   amLODipine (NORVASC) 10 MG tablet Take 1 tablet (10 mg total) by mouth daily.   aspirin 81 MG tablet Take 81 mg by mouth daily.    Carboxymethylcellul-Glycerin (CLEAR EYES FOR DRY EYES OP) Place 1 drop into both eyes daily as needed (dry eyes).   folic acid (FOLVITE) 1 MG tablet Take 1 mg by mouth daily. rheumatology   griseofulvin (GRIFULVIN V) 500 MG tablet Take 500 mg by mouth 2 (two) times daily as needed (fungus). Dr Cheree Ditto   hydrochlorothiazide (HYDRODIURIL) 50 MG tablet Take 1 tablet (50 mg total) by mouth daily.   ketoconazole (NIZORAL) 2 % shampoo Apply 1 application topically daily as needed (fungus).   losartan (COZAAR) 25 MG tablet Take 1 tablet (25 mg total) by mouth in the morning and at bedtime.   methotrexate (RHEUMATREX) 2.5 MG tablet Take 10 mg by mouth once a week. Rheumatology   Multiple Vitamin (MULTIVITAMIN WITH MINERALS) TABS tablet Take 1 tablet by mouth 2 (two) times a week.   pantoprazole (PROTONIX) 40 MG tablet TAKE 1 TABLET (40 MG TOTAL) BY MOUTH DAILY. DR Marva Panda   simvastatin (ZOCOR) 20 MG tablet Take 1 tablet (20 mg total) by mouth daily.       08/17/2022   11:01 AM 03/04/2022   11:48 AM 09/26/2021    9:22 AM 09/01/2021   10:23 AM  GAD 7 : Generalized Anxiety Score  Nervous, Anxious, on Edge 0 0 0 0  Control/stop worrying 0 0 0 0  Worry too much - different things 0 0 0 0  Trouble relaxing 0 0 0 0  Restless 0 0 0 0  Easily annoyed or irritable 0 0 0 0  Afraid - awful might happen 0 0 0 0  Total GAD 7 Score 0 0 0 0  Anxiety Difficulty Not difficult at all Not difficult at all Not difficult at all Not difficult at  all       08/17/2022   11:01 AM 03/04/2022   11:48 AM 09/26/2021    9:21 AM  Depression screen PHQ 2/9  Decreased Interest 0 0 0  Down, Depressed, Hopeless 0 0 0  PHQ - 2 Score 0 0 0  Altered sleeping 0 0 0  Tired, decreased energy 0 0 0  Change in appetite 0 0 0  Feeling bad or failure about yourself  0 0 0  Trouble concentrating 0 0 0  Moving slowly or fidgety/restless 0 0 0  Suicidal thoughts 0 0 0  PHQ-9 Score 0 0 0  Difficult doing work/chores  Not difficult at all Not  difficult at all    BP Readings from Last 3 Encounters:  08/17/22 120/78  04/01/22 (!) 149/87  03/04/22 110/70    Physical Exam Vitals and nursing note reviewed.  HENT:     Head: Normocephalic.     Right Ear: Tympanic membrane normal. No tenderness. No middle ear effusion. Tympanic membrane is not erythematous.     Left Ear: Tympanic membrane normal. No tenderness.  No middle ear effusion. Tympanic membrane is not erythematous.     Nose: No congestion or rhinorrhea.     Right Sinus: Maxillary sinus tenderness present. No frontal sinus tenderness.     Left Sinus: No maxillary sinus tenderness or frontal sinus tenderness.     Mouth/Throat:     Mouth: Mucous membranes are moist.     Pharynx: Uvula midline. Posterior oropharyngeal erythema present. No oropharyngeal exudate.     Tonsils: No tonsillar exudate or tonsillar abscesses.  Neck:     Thyroid: No thyroid tenderness.  Musculoskeletal:     Cervical back: Neck supple.  Lymphadenopathy:     Head:     Right side of head: Submandibular, tonsillar and preauricular adenopathy present. No submental or posterior auricular adenopathy.     Left side of head: No submental, submandibular, tonsillar, preauricular or posterior auricular adenopathy.     Cervical: Cervical adenopathy present.     Right cervical: Superficial cervical adenopathy present. No deep or posterior cervical adenopathy.    Left cervical: No superficial, deep or posterior cervical  adenopathy.     Comments: tenderness  Neurological:     Mental Status: She is alert.     Wt Readings from Last 3 Encounters:  08/17/22 219 lb (99.3 kg)  04/01/22 214 lb 15.2 oz (97.5 kg)  03/04/22 215 lb (97.5 kg)    BP 120/78   Pulse 80   Ht 5\' 4"  (1.626 m)   Wt 219 lb (99.3 kg)   LMP  (LMP Unknown) Comment: menopausal, denies preg  SpO2 96%   BMI 37.59 kg/m   Assessment and Plan: 1. Acute maxillary sinusitis, recurrence not specified New onset.  Persistent.  Right-sided discomfort over the maxillary and preauricular.  Ear exam is normal but throat is erythematous without exudates there is tenderness of the anterior cervical chain and submandibular and tonsillar area.  Exam is consistent with upper respiratory infection is most likely maxillary sinusitis and will treat with amoxicillin 500 mg 3 times a day for 10 days. - amoxicillin (AMOXIL) 500 MG capsule; Take 1 capsule (500 mg total) by mouth 3 (three) times daily for 10 days.  Dispense: 30 capsule; Refill: 0     Elizabeth Sauer, MD

## 2022-09-07 DIAGNOSIS — R9389 Abnormal findings on diagnostic imaging of other specified body structures: Secondary | ICD-10-CM | POA: Diagnosis not present

## 2022-09-07 DIAGNOSIS — Z7960 Long term (current) use of unspecified immunomodulators and immunosuppressants: Secondary | ICD-10-CM | POA: Diagnosis not present

## 2022-09-07 DIAGNOSIS — R899 Unspecified abnormal finding in specimens from other organs, systems and tissues: Secondary | ICD-10-CM | POA: Diagnosis not present

## 2022-09-07 DIAGNOSIS — D860 Sarcoidosis of lung: Secondary | ICD-10-CM | POA: Diagnosis not present

## 2022-09-29 DIAGNOSIS — Z7982 Long term (current) use of aspirin: Secondary | ICD-10-CM | POA: Diagnosis not present

## 2022-09-29 DIAGNOSIS — E7800 Pure hypercholesterolemia, unspecified: Secondary | ICD-10-CM | POA: Diagnosis not present

## 2022-09-29 DIAGNOSIS — I10 Essential (primary) hypertension: Secondary | ICD-10-CM | POA: Diagnosis not present

## 2022-09-29 DIAGNOSIS — R490 Dysphonia: Secondary | ICD-10-CM | POA: Diagnosis not present

## 2022-09-29 DIAGNOSIS — K219 Gastro-esophageal reflux disease without esophagitis: Secondary | ICD-10-CM | POA: Diagnosis not present

## 2022-09-30 ENCOUNTER — Encounter: Payer: BC Managed Care – PPO | Admitting: Family Medicine

## 2022-10-03 ENCOUNTER — Other Ambulatory Visit: Payer: Self-pay | Admitting: Family Medicine

## 2022-10-03 DIAGNOSIS — K219 Gastro-esophageal reflux disease without esophagitis: Secondary | ICD-10-CM

## 2022-10-03 DIAGNOSIS — R69 Illness, unspecified: Secondary | ICD-10-CM

## 2022-10-03 DIAGNOSIS — I10 Essential (primary) hypertension: Secondary | ICD-10-CM

## 2022-10-03 DIAGNOSIS — E7849 Other hyperlipidemia: Secondary | ICD-10-CM

## 2022-10-06 ENCOUNTER — Encounter: Payer: Self-pay | Admitting: Family Medicine

## 2022-10-06 ENCOUNTER — Ambulatory Visit (INDEPENDENT_AMBULATORY_CARE_PROVIDER_SITE_OTHER): Payer: BC Managed Care – PPO | Admitting: Family Medicine

## 2022-10-06 ENCOUNTER — Other Ambulatory Visit: Payer: Self-pay | Admitting: Family Medicine

## 2022-10-06 ENCOUNTER — Ambulatory Visit
Admission: RE | Admit: 2022-10-06 | Discharge: 2022-10-06 | Disposition: A | Discharge status: Home / Self Care | Payer: BC Managed Care – PPO | Source: Ambulatory Visit | Attending: Family Medicine | Admitting: Family Medicine

## 2022-10-06 VITALS — BP 120/78 | HR 77 | Ht 64.0 in | Wt 216.0 lb

## 2022-10-06 DIAGNOSIS — Z1231 Encounter for screening mammogram for malignant neoplasm of breast: Secondary | ICD-10-CM | POA: Insufficient documentation

## 2022-10-06 DIAGNOSIS — E7849 Other hyperlipidemia: Secondary | ICD-10-CM

## 2022-10-06 DIAGNOSIS — R69 Illness, unspecified: Secondary | ICD-10-CM | POA: Diagnosis not present

## 2022-10-06 DIAGNOSIS — K219 Gastro-esophageal reflux disease without esophagitis: Secondary | ICD-10-CM | POA: Diagnosis not present

## 2022-10-06 DIAGNOSIS — I10 Essential (primary) hypertension: Secondary | ICD-10-CM | POA: Diagnosis not present

## 2022-10-06 MED ORDER — SIMVASTATIN 20 MG PO TABS
20.0000 mg | ORAL_TABLET | Freq: Every day | ORAL | 1 refills | Status: DC
Start: 2022-10-06 — End: 2023-04-07

## 2022-10-06 MED ORDER — AMLODIPINE BESYLATE 10 MG PO TABS: 10.0000 mg | ORAL_TABLET | Freq: Every day | ORAL | 1 refills | Status: DC

## 2022-10-06 MED ORDER — HYDROCHLOROTHIAZIDE 50 MG PO TABS: 50.0000 mg | ORAL_TABLET | Freq: Every day | ORAL | 1 refills | Status: DC

## 2022-10-06 MED ORDER — LOSARTAN POTASSIUM 25 MG PO TABS
25.0000 mg | ORAL_TABLET | Freq: Two times a day (BID) | ORAL | 1 refills | Status: DC
Start: 2022-10-06 — End: 2023-01-21

## 2022-10-06 MED ORDER — PANTOPRAZOLE SODIUM 40 MG PO TBEC
40.0000 mg | DELAYED_RELEASE_TABLET | Freq: Every day | ORAL | 1 refills | Status: DC
Start: 2022-10-06 — End: 2023-04-07

## 2022-10-06 NOTE — Progress Notes (Signed)
Date:  10/06/2022   Name:  Susan Gregory   DOB:  1960/03/11   MRN:  [id number]   Chief Complaint: Hypertension, Gastroesophageal Reflux, and Hyperlipidemia  Hypertension This is a chronic problem. The problem has been gradually improving since onset. Pertinent negatives include no blurred vision, chest pain, headaches, malaise/fatigue, orthopnea, palpitations, peripheral edema, PND, shortness of breath or sweats. There are no associated agents to hypertension. Risk factors for coronary artery disease include dyslipidemia. Past treatments include calcium channel blockers, angiotensin blockers and diuretics. The current treatment provides moderate improvement. There are no compliance problems.  There is no history of CAD/MI or CVA. There is no history of chronic renal disease, a hypertension causing med or renovascular disease.  Gastroesophageal Reflux She reports no abdominal pain, no chest pain, no coughing or no wheezing. This is a chronic problem. The current episode started more than 1 year ago. The problem occurs rarely. The symptoms are aggravated by certain foods. Pertinent negatives include no anemia, fatigue, melena or muscle weakness. She has tried a PPI for the symptoms. The treatment provided moderate relief.  Hyperlipidemia This is a chronic problem. The current episode started more than 1 year ago. The problem is controlled. Recent lipid tests were reviewed and are normal. She has no history of chronic renal disease. Factors aggravating her hyperlipidemia include thiazides. Pertinent negatives include no chest pain or shortness of breath. Current antihyperlipidemic treatment includes statins. The current treatment provides moderate improvement of lipids. There are no compliance problems.     Lab Results  Component Value Date   NA 142 03/04/2022   K 3.8 03/04/2022   CO2 24 03/04/2022   GLUCOSE 96 03/04/2022   BUN 14 03/04/2022   CREATININE 0.76 03/04/2022   CALCIUM 9.9  03/04/2022   EGFR 88 03/04/2022   GFRNONAA 73 03/27/2020   Lab Results  Component Value Date   CHOL 188 03/04/2022   HDL 60 03/04/2022   LDLCALC 116 (H) 03/04/2022   TRIG 66 03/04/2022   CHOLHDL 3.9 09/26/2019   No results found for: "TSH" No results found for: "HGBA1C" Lab Results  Component Value Date   WBC 3.5 (L) 08/15/2017   HGB 14.5 08/15/2017   HCT 42.2 08/15/2017   MCV 86.6 08/15/2017   PLT 181 08/15/2017   Lab Results  Component Value Date   ALT 18 03/04/2022   AST 16 03/04/2022   ALKPHOS 76 03/04/2022   BILITOT 0.5 03/04/2022   No results found for: "25OHVITD2", "25OHVITD3", "VD25OH"   Review of Systems  Constitutional:  Negative for fatigue, malaise/fatigue and unexpected weight change.  Eyes:  Negative for blurred vision.  Respiratory:  Negative for cough, chest tightness, shortness of breath and wheezing.   Cardiovascular:  Negative for chest pain, palpitations, orthopnea, leg swelling and PND.  Gastrointestinal:  Negative for abdominal pain, blood in stool, constipation, diarrhea and melena.  Endocrine: Negative for polydipsia, polyphagia and polyuria.  Genitourinary:  Negative for difficulty urinating and dysuria.  Musculoskeletal:  Negative for muscle weakness.  Neurological:  Negative for headaches.    Patient Active Problem List   Diagnosis Date Noted   Gastroesophageal reflux disease 08/29/2020   BMI 35.0-35.9,adult 08/29/2020   Abnormal CT of the chest 11/22/2019   Familial multiple lipoprotein-type hyperlipidemia 07/18/2014   Disorder of lipoprotein and lipid metabolism 07/18/2014   Benign hypertension 07/18/2014   Acid reflux 07/18/2014   Encounter for general adult medical examination without abnormal findings 07/18/2014   Neck sprain and strain  07/18/2014   Abnormal breast finding 07/18/2014   Abnormal finding on breast imaging 07/18/2014   Arthritis of knee, degenerative 07/18/2014   TB skin/subcutaneous 07/18/2014   Fecal occult  blood test positive 11/01/2013    No Known Allergies  Past Surgical History:  Procedure Laterality Date   BREAST BIOPSY Right 09/04/2016   US guided right breast biopsy. Coil marker neg   BREAST BIOPSY Right 09/07/2016   stereo biopsy. Ribbon marker neg   COLONOSCOPY     COLONOSCOPY WITH PROPOFOL N/A 11/18/2018   Procedure: COLONOSCOPY WITH PROPOFOL;  Surgeon: Christena Deem, MD;  Location: [facility] ENDOSCOPY;  Service: Endoscopy;  Laterality: N/A;   ESOPHAGOGASTRODUODENOSCOPY (EGD) WITH PROPOFOL N/A 04/20/2016   Procedure: ESOPHAGOGASTRODUODENOSCOPY (EGD) WITH PROPOFOL;  Surgeon: Christena Deem, MD;  Location: [facility] ENDOSCOPY;  Service: Endoscopy;  Laterality: N/A;   ESOPHAGOGASTRODUODENOSCOPY (EGD) WITH PROPOFOL N/A 11/18/2018   Procedure: ESOPHAGOGASTRODUODENOSCOPY (EGD) WITH PROPOFOL;  Surgeon: Christena Deem, MD;  Location: [facility] ENDOSCOPY;  Service: Endoscopy;  Laterality: N/A;   THROAT SURGERY     nodules taken off throat   TUBAL LIGATION      Social History   Tobacco Use   Smoking status: Never   Smokeless tobacco: Never  Vaping Use   Vaping status: Never Used  Substance Use Topics   Alcohol use: No    Alcohol/week: 0.0 standard drinks of alcohol   Drug use: No     Medication list has been reviewed and updated.  Current Meds  Medication Sig   acetaminophen (TYLENOL) 500 MG tablet Take 1,000 mg by mouth every 6 (six) hours as needed for moderate pain or headache.   albuterol (VENTOLIN HFA) 108 (90 Base) MCG/ACT inhaler INHALE 2 PUFFS EVERY 6 HOURS AS NEEDED FOR WHEEZING   amLODipine (NORVASC) 10 MG tablet Take 1 tablet (10 mg total) by mouth daily.   aspirin 81 MG tablet Take 81 mg by mouth daily.    Carboxymethylcellul-Glycerin (CLEAR EYES FOR DRY EYES OP) Place 1 drop into both eyes daily as needed (dry eyes).   clobetasol (TEMOVATE) 0.05 % external solution APPLY TO AFFECTED AREA TWICE A DAY   folic acid (FOLVITE) 1 MG tablet Take 1 mg by mouth daily.  rheumatology   griseofulvin (GRIFULVIN V) 500 MG tablet Take 500 mg by mouth 2 (two) times daily as needed (fungus). Dr Cheree Ditto   hydrochlorothiazide (HYDRODIURIL) 50 MG tablet Take 1 tablet (50 mg total) by mouth daily.   ketoconazole (NIZORAL) 2 % shampoo Apply 1 application topically daily as needed (fungus).   losartan (COZAAR) 25 MG tablet Take 1 tablet (25 mg total) by mouth in the morning and at bedtime.   methotrexate (RHEUMATREX) 2.5 MG tablet Take 10 mg by mouth once a week. Rheumatology   Multiple Vitamin (MULTIVITAMIN WITH MINERALS) TABS tablet Take 1 tablet by mouth 2 (two) times a week.   pantoprazole (PROTONIX) 40 MG tablet TAKE 1 TABLET (40 MG TOTAL) BY MOUTH DAILY. DR Marva Panda   simvastatin (ZOCOR) 20 MG tablet Take 1 tablet (20 mg total) by mouth daily.       10/06/2022    8:19 AM 08/17/2022   11:01 AM 03/04/2022   11:48 AM 09/26/2021    9:22 AM  GAD 7 : Generalized Anxiety Score  Nervous, Anxious, on Edge 0 0 0 0  Control/stop worrying 0 0 0 0  Worry too much - different things 0 0 0 0  Trouble relaxing 0 0 0 0  Restless 0 0  0 0  Easily annoyed or irritable 0 0 0 0  Afraid - awful might happen 0 0 0 0  Total GAD 7 Score 0 0 0 0  Anxiety Difficulty Not difficult at all Not difficult at all Not difficult at all Not difficult at all       10/06/2022    8:19 AM 08/17/2022   11:01 AM 03/04/2022   11:48 AM  Depression screen PHQ 2/9  Decreased Interest 0 0 0  Down, Depressed, Hopeless 0 0 0  PHQ - 2 Score 0 0 0  Altered sleeping 0 0 0  Tired, decreased energy 0 0 0  Change in appetite 0 0 0  Feeling bad or failure about yourself  0 0 0  Trouble concentrating 0 0 0  Moving slowly or fidgety/restless 0 0 0  Suicidal thoughts 0 0 0  PHQ-9 Score 0 0 0  Difficult doing work/chores Not difficult at all  Not difficult at all    BP Readings from Last 3 Encounters:  10/06/22 120/78  08/17/22 120/78  04/01/22 (!) 149/87    Physical Exam Vitals and nursing note  reviewed. Exam conducted with a chaperone present.  Constitutional:      General: She is not in acute distress.    Appearance: She is not diaphoretic.  HENT:     Head: Normocephalic and atraumatic.     Right Ear: Tympanic membrane and external ear normal.     Left Ear: Tympanic membrane and external ear normal.     Nose: Nose normal.     Mouth/Throat:     Mouth: Mucous membranes are moist.  Eyes:     General:        Right eye: No discharge.        Left eye: No discharge.     Conjunctiva/sclera: Conjunctivae normal.     Pupils: Pupils are equal, round, and reactive to light.  Neck:     Thyroid: No thyromegaly.     Vascular: No JVD.  Cardiovascular:     Rate and Rhythm: Normal rate and regular rhythm.     Heart sounds: Normal heart sounds. No murmur heard.    No friction rub. No gallop.  Pulmonary:     Effort: Pulmonary effort is normal.     Breath sounds: Normal breath sounds. No wheezing, rhonchi or rales.  Abdominal:     General: Bowel sounds are normal.     Palpations: Abdomen is soft. There is no mass.     Tenderness: There is no abdominal tenderness. There is no guarding.  Musculoskeletal:        General: Normal range of motion.     Cervical back: Normal range of motion and neck supple.  Lymphadenopathy:     Cervical: No cervical adenopathy.  Skin:    General: Skin is warm and dry.  Neurological:     Mental Status: She is alert.     Wt Readings from Last 3 Encounters:  10/06/22 216 lb (98 kg)  08/17/22 219 lb (99.3 kg)  04/01/22 214 lb 15.2 oz (97.5 kg)    BP 120/78   Pulse 77   Ht 5\' 4"  (1.626 m)   Wt 216 lb (98 kg)   LMP  (LMP Unknown) Comment: menopausal, denies preg  SpO2 97%   BMI 37.08 kg/m   Assessment and Plan: 1. Benign hypertension Chronic.  Controlled.  Stable.  Blood pressure 120/78.  Asymptomatic.  Tolerating medication well.  Continue amlodipine 10 mg once  a day, hydrochlorothiazide 50 mg once a day, and losartan 25 mg twice a day.  Will  check renal function panel for GFR and electrolytes.  Will recheck patient in 6 months. - amLODipine (NORVASC) 10 MG tablet; Take 1 tablet (10 mg total) by mouth daily.  Dispense: 90 tablet; Refill: 1 - hydrochlorothiazide (HYDRODIURIL) 50 MG tablet; Take 1 tablet (50 mg total) by mouth daily.  Dispense: 90 tablet; Refill: 1 - losartan (COZAAR) 25 MG tablet; Take 1 tablet (25 mg total) by mouth in the morning and at bedtime.  Dispense: 180 tablet; Refill: 1 - Renal Function Panel  2. Gastroesophageal reflux disease Chronic.  Controlled.  Stable.  Patient is doing well with diet and avoidance of spicy foods and continuance of Protonix 40 mg once a day. - pantoprazole (PROTONIX) 40 MG tablet; Take 1 tablet (40 mg total) by mouth daily. Dr Marva Panda  Dispense: 90 tablet; Refill: 1  3. Taking medication for chronic disease Patient is taken medication for chronic disease and we will follow labs. - simvastatin (ZOCOR) 20 MG tablet; Take 1 tablet (20 mg total) by mouth daily.  Dispense: 90 tablet; Refill: 1  4. Familial multiple lipoprotein-type hyperlipidemia Chronic.  Controlled.  Stable.  Reemphasized dietary guidelines for lowering lipids and willContinue simvastatin 20 mg once a day.  Review of previous lipid panel is acceptable and we will recheck in 6 months. - simvastatin (ZOCOR) 20 MG tablet; Take 1 tablet (20 mg total) by mouth daily.  Dispense: 90 tablet; Refill: 1     Elizabeth Sauer, MD

## 2022-10-06 NOTE — Patient Instructions (Signed)

## 2022-10-07 ENCOUNTER — Encounter: Payer: Self-pay | Admitting: Family Medicine

## 2022-10-07 LAB — RENAL FUNCTION PANEL
Albumin: 4.5 g/dL (ref 3.9–4.9)
BUN/Creatinine Ratio: 16 (ref 12–28)
BUN: 14 mg/dL (ref 8–27)
CO2: 27 mmol/L (ref 20–29)
Calcium: 9.7 mg/dL (ref 8.7–10.3)
Chloride: 102 mmol/L (ref 96–106)
Creatinine, Ser: 0.85 mg/dL (ref 0.57–1.00)
Glucose: 97 mg/dL (ref 70–99)
Phosphorus: 3.3 mg/dL (ref 3.0–4.3)
Potassium: 3.7 mmol/L (ref 3.5–5.2)
Sodium: 144 mmol/L (ref 134–144)
eGFR: 76 mL/min/{1.73_m2} (ref >59)

## 2022-12-07 DIAGNOSIS — D860 Sarcoidosis of lung: Secondary | ICD-10-CM | POA: Diagnosis not present

## 2022-12-07 DIAGNOSIS — H35033 Hypertensive retinopathy, bilateral: Secondary | ICD-10-CM | POA: Diagnosis not present

## 2022-12-07 DIAGNOSIS — Z961 Presence of intraocular lens: Secondary | ICD-10-CM | POA: Diagnosis not present

## 2022-12-07 DIAGNOSIS — H04123 Dry eye syndrome of bilateral lacrimal glands: Secondary | ICD-10-CM | POA: Diagnosis not present

## 2022-12-25 DIAGNOSIS — Z79899 Other long term (current) drug therapy: Secondary | ICD-10-CM | POA: Diagnosis not present

## 2022-12-25 DIAGNOSIS — M179 Osteoarthritis of knee, unspecified: Secondary | ICD-10-CM | POA: Diagnosis not present

## 2022-12-25 DIAGNOSIS — R911 Solitary pulmonary nodule: Secondary | ICD-10-CM | POA: Diagnosis not present

## 2022-12-25 DIAGNOSIS — R0602 Shortness of breath: Secondary | ICD-10-CM | POA: Diagnosis not present

## 2022-12-25 DIAGNOSIS — Z7982 Long term (current) use of aspirin: Secondary | ICD-10-CM | POA: Diagnosis not present

## 2022-12-25 DIAGNOSIS — I10 Essential (primary) hypertension: Secondary | ICD-10-CM | POA: Diagnosis not present

## 2022-12-25 DIAGNOSIS — E7800 Pure hypercholesterolemia, unspecified: Secondary | ICD-10-CM | POA: Diagnosis not present

## 2022-12-25 DIAGNOSIS — Z791 Long term (current) use of non-steroidal anti-inflammatories (NSAID): Secondary | ICD-10-CM | POA: Diagnosis not present

## 2022-12-25 DIAGNOSIS — R918 Other nonspecific abnormal finding of lung field: Secondary | ICD-10-CM | POA: Diagnosis not present

## 2022-12-25 DIAGNOSIS — D860 Sarcoidosis of lung: Secondary | ICD-10-CM | POA: Diagnosis not present

## 2022-12-25 DIAGNOSIS — R079 Chest pain, unspecified: Secondary | ICD-10-CM | POA: Diagnosis not present

## 2022-12-25 DIAGNOSIS — R0789 Other chest pain: Secondary | ICD-10-CM | POA: Diagnosis not present

## 2022-12-25 DIAGNOSIS — K219 Gastro-esophageal reflux disease without esophagitis: Secondary | ICD-10-CM | POA: Diagnosis not present

## 2022-12-25 DIAGNOSIS — M1990 Unspecified osteoarthritis, unspecified site: Secondary | ICD-10-CM | POA: Diagnosis not present

## 2022-12-26 DIAGNOSIS — R911 Solitary pulmonary nodule: Secondary | ICD-10-CM | POA: Diagnosis not present

## 2022-12-26 DIAGNOSIS — D860 Sarcoidosis of lung: Secondary | ICD-10-CM | POA: Diagnosis not present

## 2022-12-27 DIAGNOSIS — R079 Chest pain, unspecified: Secondary | ICD-10-CM | POA: Diagnosis not present

## 2022-12-29 ENCOUNTER — Encounter: Payer: Self-pay | Admitting: Family Medicine

## 2022-12-29 ENCOUNTER — Ambulatory Visit (INDEPENDENT_AMBULATORY_CARE_PROVIDER_SITE_OTHER): Payer: BC Managed Care – PPO | Admitting: Family Medicine

## 2022-12-29 VITALS — BP 114/78 | HR 75 | Resp 16 | Ht 64.0 in | Wt 219.0 lb

## 2022-12-29 DIAGNOSIS — E010 Iodine-deficiency related diffuse (endemic) goiter: Secondary | ICD-10-CM

## 2022-12-29 DIAGNOSIS — R9389 Abnormal findings on diagnostic imaging of other specified body structures: Secondary | ICD-10-CM

## 2022-12-29 NOTE — Progress Notes (Signed)
Date:  12/29/2022   Name:  Susan Gregory   DOB:  August 19, 1960   MRN:  [id number]   Chief Complaint: Hospitalization Follow-up  Thyroid Problem Presents for initial visit. Symptoms include hoarse voice. Patient reports no anxiety, cold intolerance, constipation, depressed mood, fatigue, hair loss, heat intolerance, palpitations or weight gain. The symptoms have been stable. The treatment provided mild relief.    Lab Results  Component Value Date   NA 144 10/06/2022   K 3.7 10/06/2022   CO2 27 10/06/2022   GLUCOSE 97 10/06/2022   BUN 14 10/06/2022   CREATININE 0.85 10/06/2022   CALCIUM 9.7 10/06/2022   EGFR 76 10/06/2022   GFRNONAA 73 03/27/2020   Lab Results  Component Value Date   CHOL 188 03/04/2022   HDL 60 03/04/2022   LDLCALC 116 (H) 03/04/2022   TRIG 66 03/04/2022   CHOLHDL 3.9 09/26/2019   No results found for: "TSH" No results found for: "HGBA1C" Lab Results  Component Value Date   WBC 3.5 (L) 08/15/2017   HGB 14.5 08/15/2017   HCT 42.2 08/15/2017   MCV 86.6 08/15/2017   PLT 181 08/15/2017   Lab Results  Component Value Date   ALT 18 03/04/2022   AST 16 03/04/2022   ALKPHOS 76 03/04/2022   BILITOT 0.5 03/04/2022   No results found for: "25OHVITD2", "25OHVITD3", "VD25OH"   Review of Systems  Constitutional:  Negative for fatigue, fever and weight gain.  HENT:  Positive for hoarse voice.   Respiratory:  Negative for shortness of breath and wheezing.   Cardiovascular:  Negative for palpitations.  Gastrointestinal:  Negative for constipation.  Endocrine: Negative for cold intolerance and heat intolerance.  Psychiatric/Behavioral:  The patient is not nervous/anxious.     Patient Active Problem List   Diagnosis Date Noted   Gastroesophageal reflux disease 08/29/2020   BMI 35.0-35.9,adult 08/29/2020   Abnormal CT of the chest 11/22/2019   Familial multiple lipoprotein-type hyperlipidemia 07/18/2014   Disorder of lipoprotein and lipid metabolism  07/18/2014   Benign hypertension 07/18/2014   Acid reflux 07/18/2014   Encounter for general adult medical examination without abnormal findings 07/18/2014   Neck sprain and strain 07/18/2014   Abnormal breast finding 07/18/2014   Abnormal finding on breast imaging 07/18/2014   Arthritis of knee, degenerative 07/18/2014   TB skin/subcutaneous 07/18/2014   Fecal occult blood test positive 11/01/2013    No Known Allergies  Past Surgical History:  Procedure Laterality Date   BREAST BIOPSY Right 09/04/2016   US guided right breast biopsy. Coil marker neg   BREAST BIOPSY Right 09/07/2016   stereo biopsy. Ribbon marker neg   COLONOSCOPY     COLONOSCOPY WITH PROPOFOL N/A 11/18/2018   Procedure: COLONOSCOPY WITH PROPOFOL;  Surgeon: Christena Deem, MD;  [facility] ENDOSCOPY;  Service: Endoscopy;  Laterality: N/A;   ESOPHAGOGASTRODUODENOSCOPY (EGD) WITH PROPOFOL N/A 04/20/2016   Procedure: ESOPHAGOGASTRODUODENOSCOPY (EGD) WITH PROPOFOL;  Surgeon: Christena Deem, MD;  Location: [facility] ENDOSCOPY;  Service: Endoscopy;  Laterality: N/A;   ESOPHAGOGASTRODUODENOSCOPY (EGD) WITH PROPOFOL N/A 11/18/2018   Procedure: ESOPHAGOGASTRODUODENOSCOPY (EGD) WITH PROPOFOL;  Surgeon: Christena Deem, MD;  Location: [facility] ENDOSCOPY;  Service: Endoscopy;  Laterality: N/A;   THROAT SURGERY     nodules taken off throat   TUBAL LIGATION      Social History   Tobacco Use   Smoking status: Never   Smokeless tobacco: Never  Vaping Use   Vaping status: Never Used  Substance Use Topics  Alcohol use: No    Alcohol/week: 0.0 standard drinks of alcohol   Drug use: No     Medication list has been reviewed and updated.  Current Meds  Medication Sig   acetaminophen (TYLENOL) 500 MG tablet Take 1,000 mg by mouth every 6 (six) hours as needed for moderate pain or headache.   albuterol (VENTOLIN HFA) 108 (90 Base) MCG/ACT inhaler INHALE 2 PUFFS EVERY 6 HOURS AS NEEDED FOR WHEEZING   amLODipine (NORVASC)  10 MG tablet Take 1 tablet (10 mg total) by mouth daily.   aspirin 81 MG tablet Take 81 mg by mouth daily.    Carboxymethylcellul-Glycerin (CLEAR EYES FOR DRY EYES OP) Place 1 drop into both eyes daily as needed (dry eyes).   clobetasol (TEMOVATE) 0.05 % external solution APPLY TO AFFECTED AREA TWICE A DAY   folic acid (FOLVITE) 1 MG tablet Take 1 mg by mouth daily. rheumatology   griseofulvin (GRIFULVIN V) 500 MG tablet Take 500 mg by mouth 2 (two) times daily as needed (fungus). Dr Cheree Ditto   hydrochlorothiazide (HYDRODIURIL) 50 MG tablet Take 1 tablet (50 mg total) by mouth daily.   ketoconazole (NIZORAL) 2 % shampoo Apply 1 application topically daily as needed (fungus).   losartan (COZAAR) 25 MG tablet Take 1 tablet (25 mg total) by mouth in the morning and at bedtime.   methotrexate (RHEUMATREX) 2.5 MG tablet Take 10 mg by mouth once a week. Rheumatology   Multiple Vitamin (MULTIVITAMIN WITH MINERALS) TABS tablet Take 1 tablet by mouth 2 (two) times a week.   pantoprazole (PROTONIX) 40 MG tablet Take 1 tablet (40 mg total) by mouth daily. Dr Marva Panda   simvastatin (ZOCOR) 20 MG tablet Take 1 tablet (20 mg total) by mouth daily.       12/29/2022    3:58 PM 10/06/2022    8:19 AM 08/17/2022   11:01 AM 03/04/2022   11:48 AM  GAD 7 : Generalized Anxiety Score  Nervous, Anxious, on Edge 0 0 0 0  Control/stop worrying 0 0 0 0  Worry too much - different things 0 0 0 0  Trouble relaxing 0 0 0 0  Restless 0 0 0 0  Easily annoyed or irritable 0 0 0 0  Afraid - awful might happen 0 0 0 0  Total GAD 7 Score 0 0 0 0  Anxiety Difficulty  Not difficult at all Not difficult at all Not difficult at all       12/29/2022    3:58 PM 10/06/2022    8:19 AM 08/17/2022   11:01 AM  Depression screen PHQ 2/9  Decreased Interest 0 0 0  Down, Depressed, Hopeless 0 0 0  PHQ - 2 Score 0 0 0  Altered sleeping  0 0  Tired, decreased energy  0 0  Change in appetite  0 0  Feeling bad or failure about  yourself   0 0  Trouble concentrating  0 0  Moving slowly or fidgety/restless  0 0  Suicidal thoughts  0 0  PHQ-9 Score  0 0  Difficult doing work/chores  Not difficult at all     BP Readings from Last 3 Encounters:  12/29/22 114/78  10/06/22 120/78  08/17/22 120/78    Physical Exam HENT:     Head: Normocephalic.     Right Ear: Tympanic membrane and ear canal normal.     Left Ear: Tympanic membrane and ear canal normal.     Nose: Nose normal.  Mouth/Throat:     Mouth: Mucous membranes are moist.  Eyes:     Pupils: Pupils are equal, round, and reactive to light.  Neck:     Thyroid: No thyroid mass, thyromegaly or thyroid tenderness.  Cardiovascular:     Rate and Rhythm: Normal rate.     Heart sounds: Normal heart sounds. No murmur heard.    No friction rub. No gallop.  Pulmonary:     Effort: No respiratory distress.     Breath sounds: No wheezing, rhonchi or rales.  Abdominal:     General: Bowel sounds are normal.     Tenderness: There is no abdominal tenderness. There is no guarding.  Musculoskeletal:     Cervical back: Neck supple. No tenderness.  Lymphadenopathy:     Cervical: No cervical adenopathy.     Right cervical: No superficial, deep or posterior cervical adenopathy.    Left cervical: No superficial, deep or posterior cervical adenopathy.  Neurological:     Mental Status: She is alert.     Wt Readings from Last 3 Encounters:  12/29/22 219 lb (99.3 kg)  10/06/22 216 lb (98 kg)  08/17/22 219 lb (99.3 kg)    BP 114/78   Pulse 75   Resp 16   Ht 5\' 4"  (1.626 m)   Wt 219 lb (99.3 kg)   LMP  (LMP Unknown) Comment: menopausal, denies preg  SpO2 97%   BMI 37.59 kg/m   Assessment and Plan: 1. Thyromegaly New onset.  Noticed on chest CT that the thyroid was enlarged and had some extensions that went into the mediastinal chest area.  This is the first that this has been noticed and mentioned and patient has been recently evaluated for hoarseness at [facility]  ear nose and throat and would like to pursue further care and a local setting and we will contact ear nose and throat [place] for evaluation.  Referral to ear nose and throat. - Ambulatory referral to ENT - Thyroid Panel With TSH  2. Abnormal CT of the chest Abnormal chest CT when patient went for evaluation for chest pain.  Evaluation of the chest changes sarcoidosis.  Because of congestive heart failure they ordered a chest CT which brought up others concerns dealing with the patient's thyroid.  Which is described as an enlarged heterogeneous thyroid gland with at least 1 fairly well-defined approximately 2 cm nodule.  There is what is described as multiple hypoattenuating lesions within the thyroid which measure up to 2.1 cm in the left lobe extending inferiorly into the mediastinum which I am wondering may be involving the recurrent laryngeal nerve.  Which could be causing some of her hoarseness.  Plan is to refer to ear nose and throat for further evaluation. - Ambulatory referral to ENT - Thyroid Panel With TSH     Elizabeth Sauer, MD

## 2023-01-04 DIAGNOSIS — E041 Nontoxic single thyroid nodule: Secondary | ICD-10-CM | POA: Diagnosis not present

## 2023-01-05 ENCOUNTER — Other Ambulatory Visit: Payer: Self-pay | Admitting: Otolaryngology

## 2023-01-05 DIAGNOSIS — E041 Nontoxic single thyroid nodule: Secondary | ICD-10-CM

## 2023-01-14 ENCOUNTER — Ambulatory Visit
Admission: RE | Admit: 2023-01-14 | Discharge: 2023-01-14 | Disposition: A | Discharge status: Home / Self Care | Payer: BC Managed Care – PPO | Source: Ambulatory Visit | Attending: Otolaryngology | Admitting: Otolaryngology

## 2023-01-14 DIAGNOSIS — E042 Nontoxic multinodular goiter: Secondary | ICD-10-CM | POA: Diagnosis not present

## 2023-01-14 DIAGNOSIS — E041 Nontoxic single thyroid nodule: Secondary | ICD-10-CM | POA: Insufficient documentation

## 2023-01-21 ENCOUNTER — Other Ambulatory Visit: Payer: Self-pay | Admitting: Family Medicine

## 2023-01-21 DIAGNOSIS — I10 Essential (primary) hypertension: Secondary | ICD-10-CM

## 2023-01-21 NOTE — Telephone Encounter (Signed)
Requested Prescriptions  Pending Prescriptions Disp Refills   hydrochlorothiazide (HYDRODIURIL) 50 MG tablet [Pharmacy Med Name: HYDROCHLOROTHIAZIDE 50 MG TAB] 90 tablet 1    Sig: TAKE 1 TABLET BY MOUTH EVERY DAY     Cardiovascular: Diuretics - Thiazide Passed - 01/21/2023  2:52 PM      Passed - Cr in normal range and within 180 days    Creatinine, Ser  Date Value Ref Range Status  10/06/2022 0.85 0.57 - 1.00 mg/dL Final         Passed - K in normal range and within 180 days    Potassium  Date Value Ref Range Status  10/06/2022 3.7 3.5 - 5.2 mmol/L Final         Passed - Na in normal range and within 180 days    Sodium  Date Value Ref Range Status  10/06/2022 144 134 - 144 mmol/L Final         Passed - Last BP in normal range    BP Readings from Last 1 Encounters:  12/29/22 114/78         Passed - Valid encounter within last 6 months    Recent Outpatient Visits           3 weeks ago Thyromegaly   [facility]   3 months ago Benign hypertension   [place] [facility]   5 months ago Acute maxillary sinusitis, recurrence not specified   [place] [facility]   10 months ago Benign hypertension   [place] [facility]   1 year ago Annual physical exam   [place] [facility]       Future Appointments             In 2 months Duanne Limerick, MD [facility]             losartan (COZAAR) 25 MG tablet [Pharmacy Med Name: LOSARTAN POTASSIUM 25 MG TAB] 180 tablet 1    Sig: TAKE 1 TABLET (25 MG) BY MOUTH IN THE MORNING AND AT BEDTIME     Cardiovascular:  Angiotensin Receptor Blockers Passed - 01/21/2023   2:52 PM      Passed - Cr in normal range and within 180 days    Creatinine, Ser  Date Value Ref Range Status  10/06/2022 0.85 0.57 - 1.00 mg/dL Final         Passed - K in normal range and within 180 days    Potassium  Date Value Ref Range Status  10/06/2022 3.7 3.5 - 5.2 mmol/L Final         Passed - Patient is not pregnant      Passed - Last BP in normal range    BP Readings from Last 1 Encounters:  12/29/22 114/78         Passed - Valid encounter within last 6 months    Recent Outpatient Visits           3 weeks ago Thyromegaly   [place] [facility]   3 months ago Benign hypertension   [facility]   5 months ago Acute maxillary sinusitis, recurrence not specified   [facility]   10 months ago Benign hypertension   [place] [facility]   1 year ago Annual physical exam   [facility]       Future Appointments             In 2 months Duanne Limerick, MD [facility] at [facility], [facility]

## 2023-02-23 DIAGNOSIS — H26493 Other secondary cataract, bilateral: Secondary | ICD-10-CM | POA: Diagnosis not present

## 2023-02-23 DIAGNOSIS — H35362 Drusen (degenerative) of macula, left eye: Secondary | ICD-10-CM | POA: Diagnosis not present

## 2023-02-23 DIAGNOSIS — H35363 Drusen (degenerative) of macula, bilateral: Secondary | ICD-10-CM | POA: Diagnosis not present

## 2023-02-23 DIAGNOSIS — H35033 Hypertensive retinopathy, bilateral: Secondary | ICD-10-CM | POA: Diagnosis not present

## 2023-03-09 DIAGNOSIS — H26491 Other secondary cataract, right eye: Secondary | ICD-10-CM | POA: Diagnosis not present

## 2023-04-07 ENCOUNTER — Other Ambulatory Visit: Payer: Self-pay | Admitting: Family Medicine

## 2023-04-07 DIAGNOSIS — E7849 Other hyperlipidemia: Secondary | ICD-10-CM

## 2023-04-07 DIAGNOSIS — R69 Illness, unspecified: Secondary | ICD-10-CM

## 2023-04-07 DIAGNOSIS — K219 Gastro-esophageal reflux disease without esophagitis: Secondary | ICD-10-CM

## 2023-04-07 DIAGNOSIS — I10 Essential (primary) hypertension: Secondary | ICD-10-CM

## 2023-04-08 ENCOUNTER — Ambulatory Visit (INDEPENDENT_AMBULATORY_CARE_PROVIDER_SITE_OTHER): Payer: BC Managed Care – PPO | Admitting: Family Medicine

## 2023-04-08 ENCOUNTER — Encounter: Payer: Self-pay | Admitting: Family Medicine

## 2023-04-08 DIAGNOSIS — J4521 Mild intermittent asthma with (acute) exacerbation: Secondary | ICD-10-CM

## 2023-04-08 DIAGNOSIS — K219 Gastro-esophageal reflux disease without esophagitis: Secondary | ICD-10-CM

## 2023-04-08 DIAGNOSIS — L280 Lichen simplex chronicus: Secondary | ICD-10-CM | POA: Diagnosis not present

## 2023-04-08 DIAGNOSIS — E7849 Other hyperlipidemia: Secondary | ICD-10-CM

## 2023-04-08 DIAGNOSIS — R69 Illness, unspecified: Secondary | ICD-10-CM | POA: Diagnosis not present

## 2023-04-08 DIAGNOSIS — L738 Other specified follicular disorders: Secondary | ICD-10-CM | POA: Diagnosis not present

## 2023-04-08 DIAGNOSIS — L918 Other hypertrophic disorders of the skin: Secondary | ICD-10-CM | POA: Diagnosis not present

## 2023-04-08 DIAGNOSIS — I10 Essential (primary) hypertension: Secondary | ICD-10-CM | POA: Diagnosis not present

## 2023-04-08 MED ORDER — PANTOPRAZOLE SODIUM 40 MG PO TBEC: 40.0000 mg | DELAYED_RELEASE_TABLET | Freq: Every day | ORAL | 1 refills | Status: DC

## 2023-04-08 MED ORDER — SIMVASTATIN 20 MG PO TABS: 20.0000 mg | ORAL_TABLET | Freq: Every day | ORAL | 1 refills | Status: DC

## 2023-04-08 MED ORDER — ALBUTEROL SULFATE HFA 108 (90 BASE) MCG/ACT IN AERS: INHALATION_SPRAY | RESPIRATORY_TRACT | 1 refills | Status: DC

## 2023-04-08 MED ORDER — HYDROCHLOROTHIAZIDE 50 MG PO TABS: 50.0000 mg | ORAL_TABLET | Freq: Every day | ORAL | 1 refills | Status: DC

## 2023-04-08 MED ORDER — AMLODIPINE BESYLATE 10 MG PO TABS: 10.0000 mg | ORAL_TABLET | Freq: Every day | ORAL | 1 refills | Status: DC

## 2023-04-08 MED ORDER — LOSARTAN POTASSIUM 25 MG PO TABS: 25.0000 mg | ORAL_TABLET | Freq: Two times a day (BID) | ORAL | 1 refills | Status: DC

## 2023-04-08 NOTE — Progress Notes (Signed)
 Date:  04/08/2023   Name:  Susan Gregory   DOB:  01/16/1961   MRN:  [id number]   Chief Complaint: Medical Management of Chronic Issues (Patient presents for a medication refill. She is also here to discuss HTN, GERD, and HLD. She has been taking her medication as directed. )  Hypertension This is a chronic problem. The current episode started more than 1 year ago. The problem has been gradually improving since onset. The problem is controlled. Pertinent negatives include no anxiety, blurred vision, chest pain, headaches, malaise/fatigue, neck pain, orthopnea, palpitations, peripheral edema, PND, shortness of breath or sweats. There are no associated agents to hypertension. There are no known risk factors for coronary artery disease. Past treatments include angiotensin blockers, diuretics and calcium channel blockers. The current treatment provides moderate improvement. There are no compliance problems.  There is no history of CAD/MI or CVA. There is no history of chronic renal disease, a hypertension causing med or renovascular disease.  Hyperlipidemia She has no history of chronic renal disease. Pertinent negatives include no chest pain, myalgias or shortness of breath. Current antihyperlipidemic treatment includes statins. The current treatment provides moderate improvement of lipids. Risk factors for coronary artery disease include dyslipidemia and hypertension.  Gastroesophageal Reflux She reports no abdominal pain, no belching, no chest pain, no choking, no coughing, no heartburn, no nausea, no sore throat or no wheezing. This is a new problem. The current episode started more than 1 year ago. The problem has been gradually improving. Pertinent negatives include no fatigue. She has tried a PPI for the symptoms. The treatment provided moderate relief.    Lab Results  Component Value Date   NA 144 10/06/2022   K 3.7 10/06/2022   CO2 27 10/06/2022   GLUCOSE 97 10/06/2022   BUN 14 10/06/2022    CREATININE 0.85 10/06/2022   CALCIUM 9.7 10/06/2022   EGFR 76 10/06/2022   GFRNONAA 73 03/27/2020   Lab Results  Component Value Date   CHOL 188 03/04/2022   HDL 60 03/04/2022   LDLCALC 116 (H) 03/04/2022   TRIG 66 03/04/2022   CHOLHDL 3.9 09/26/2019   No results found for: "TSH" No results found for: "HGBA1C" Lab Results  Component Value Date   WBC 3.5 (L) 08/15/2017   HGB 14.5 08/15/2017   HCT 42.2 08/15/2017   MCV 86.6 08/15/2017   PLT 181 08/15/2017   Lab Results  Component Value Date   ALT 18 03/04/2022   AST 16 03/04/2022   ALKPHOS 76 03/04/2022   BILITOT 0.5 03/04/2022   No results found for: "25OHVITD2", "25OHVITD3", "VD25OH"   Review of Systems  Constitutional: Negative.  Negative for chills, fatigue, fever, malaise/fatigue and unexpected weight change.  HENT:  Negative for congestion, ear discharge, ear pain, rhinorrhea, sinus pressure, sneezing and sore throat.   Eyes:  Negative for blurred vision.  Respiratory:  Negative for cough, choking, shortness of breath, wheezing and stridor.   Cardiovascular:  Negative for chest pain, palpitations, orthopnea and PND.  Gastrointestinal:  Negative for abdominal pain, blood in stool, constipation, diarrhea, heartburn and nausea.  Genitourinary:  Negative for dysuria, flank pain, frequency, hematuria, urgency and vaginal discharge.  Musculoskeletal:  Negative for arthralgias, back pain, myalgias and neck pain.  Skin:  Negative for rash.  Neurological:  Negative for dizziness, weakness and headaches.  Hematological:  Negative for adenopathy. Does not bruise/bleed easily.  Psychiatric/Behavioral:  Negative for dysphoric mood. The patient is not nervous/anxious.     Patient  Active Problem List   Diagnosis Date Noted   Gastroesophageal reflux disease 08/29/2020   BMI 35.0-35.9,adult 08/29/2020   Abnormal CT of the chest 11/22/2019   Familial multiple lipoprotein-type hyperlipidemia 07/18/2014   Disorder of  lipoprotein and lipid metabolism 07/18/2014   Benign hypertension 07/18/2014   Acid reflux 07/18/2014   Encounter for general adult medical examination without abnormal findings 07/18/2014   Neck sprain and strain 07/18/2014   Abnormal breast finding 07/18/2014   Abnormal finding on breast imaging 07/18/2014   Arthritis of knee, degenerative 07/18/2014   TB skin/subcutaneous 07/18/2014   Fecal occult blood test positive 11/01/2013    No Known Allergies  Past Surgical History:  Procedure Laterality Date   BREAST BIOPSY Right 09/04/2016   US guided right breast biopsy. Coil marker neg   BREAST BIOPSY Right 09/07/2016   stereo biopsy. Ribbon marker neg   COLONOSCOPY     COLONOSCOPY WITH PROPOFOL N/A 11/18/2018   Procedure: COLONOSCOPY WITH PROPOFOL;  Surgeon: Christena Deem, MD;  Location: [facility] ENDOSCOPY;  Service: Endoscopy;  Laterality: N/A;   ESOPHAGOGASTRODUODENOSCOPY (EGD) WITH PROPOFOL N/A 04/20/2016   Procedure: ESOPHAGOGASTRODUODENOSCOPY (EGD) WITH PROPOFOL;  Surgeon: Christena Deem, MD;  Location: [facility] ENDOSCOPY;  Service: Endoscopy;  Laterality: N/A;   ESOPHAGOGASTRODUODENOSCOPY (EGD) WITH PROPOFOL N/A 11/18/2018   Procedure: ESOPHAGOGASTRODUODENOSCOPY (EGD) WITH PROPOFOL;  Surgeon: Christena Deem, MD;  Location: [facility] ENDOSCOPY;  Service: Endoscopy;  Laterality: N/A;   THROAT SURGERY     nodules taken off throat   TUBAL LIGATION      Social History   Tobacco Use   Smoking status: Never   Smokeless tobacco: Never  Vaping Use   Vaping status: Never Used  Substance Use Topics   Alcohol use: No    Alcohol/week: 0.0 standard drinks of alcohol   Drug use: No     Medication list has been reviewed and updated.  Current Meds  Medication Sig   acetaminophen (TYLENOL) 500 MG tablet Take 1,000 mg by mouth every 6 (six) hours as needed for moderate pain or headache.   amLODipine (NORVASC) 10 MG tablet TAKE 1 TABLET BY MOUTH EVERY DAY   aspirin 81 MG tablet Take  81 mg by mouth daily.    Carboxymethylcellul-Glycerin (CLEAR EYES FOR DRY EYES OP) Place 1 drop into both eyes daily as needed (dry eyes).   folic acid (FOLVITE) 1 MG tablet Take 1 mg by mouth daily. rheumatology   hydrochlorothiazide (HYDRODIURIL) 50 MG tablet TAKE 1 TABLET BY MOUTH EVERY DAY   ketoconazole (NIZORAL) 2 % shampoo Apply 1 application topically daily as needed (fungus).   losartan (COZAAR) 25 MG tablet TAKE 1 TABLET (25 MG) BY MOUTH IN THE MORNING AND AT BEDTIME   methotrexate (RHEUMATREX) 2.5 MG tablet Take 10 mg by mouth once a week. Rheumatology   Multiple Vitamin (MULTIVITAMIN WITH MINERALS) TABS tablet Take 1 tablet by mouth 2 (two) times a week.   pantoprazole (PROTONIX) 40 MG tablet TAKE 1 TABLET (40 MG TOTAL) BY MOUTH DAILY   simvastatin (ZOCOR) 20 MG tablet TAKE 1 TABLET BY MOUTH EVERY DAY       04/08/2023    9:31 AM 12/29/2022    3:58 PM 10/06/2022    8:19 AM 08/17/2022   11:01 AM  GAD 7 : Generalized Anxiety Score  Nervous, Anxious, on Edge 0 0 0 0  Control/stop worrying 0 0 0 0  Worry too much - different things 0 0 0 0  Trouble relaxing 0 0  0 0  Restless 0 0 0 0  Easily annoyed or irritable 0 0 0 0  Afraid - awful might happen 0 0 0 0  Total GAD 7 Score 0 0 0 0  Anxiety Difficulty Not difficult at all  Not difficult at all Not difficult at all       04/08/2023    9:30 AM 12/29/2022    3:58 PM 10/06/2022    8:19 AM  Depression screen PHQ 2/9  Decreased Interest 0 0 0  Down, Depressed, Hopeless 0 0 0  PHQ - 2 Score 0 0 0  Altered sleeping 0  0  Tired, decreased energy 0  0  Change in appetite 0  0  Feeling bad or failure about yourself  0  0  Trouble concentrating 0  0  Moving slowly or fidgety/restless 0  0  Suicidal thoughts 0  0  PHQ-9 Score 0  0  Difficult doing work/chores Not difficult at all  Not difficult at all    BP Readings from Last 3 Encounters:  04/08/23 120/80  12/29/22 114/78  10/06/22 120/78    Physical Exam Vitals and  nursing note reviewed.  Constitutional:      General: She is not in acute distress.    Appearance: She is not diaphoretic.  HENT:     Head: Normocephalic and atraumatic.     Right Ear: Tympanic membrane and external ear normal.     Left Ear: Tympanic membrane and external ear normal.     Nose: Nose normal. No congestion or rhinorrhea.     Mouth/Throat:     Mouth: Mucous membranes are moist.  Eyes:     General:        Right eye: No discharge.        Left eye: No discharge.     Conjunctiva/sclera: Conjunctivae normal.     Pupils: Pupils are equal, round, and reactive to light.  Neck:     Thyroid: No thyromegaly.     Vascular: No JVD.  Cardiovascular:     Rate and Rhythm: Normal rate and regular rhythm.     Heart sounds: Normal heart sounds. No murmur heard.    No friction rub. No gallop.  Pulmonary:     Effort: Pulmonary effort is normal.     Breath sounds: Normal breath sounds. No wheezing, rhonchi or rales.  Abdominal:     General: Bowel sounds are normal.     Palpations: Abdomen is soft. There is no mass.     Tenderness: There is no abdominal tenderness. There is no guarding.  Musculoskeletal:        General: Normal range of motion.     Cervical back: Normal range of motion and neck supple.  Lymphadenopathy:     Cervical: No cervical adenopathy.  Skin:    General: Skin is warm and dry.  Neurological:     Mental Status: She is alert.     Deep Tendon Reflexes: Reflexes are normal and symmetric.     Wt Readings from Last 3 Encounters:  04/08/23 219 lb (99.3 kg)  12/29/22 219 lb (99.3 kg)  10/06/22 216 lb (98 kg)    BP 120/80   Pulse 82   Ht 5\' 4"  (1.626 m)   Wt 219 lb (99.3 kg)   LMP  (LMP Unknown) Comment: menopausal, denies preg  SpO2 97%   BMI 37.59 kg/m   Assessment and Plan: 1. Reactive airway disease, mild intermittent, with acute exacerbation Chronic.  Controlled.  Intermittent and mild.  Currently controlled with albuterol inhaler 2 puffs every 6  hours as needed will recheck on as-needed basis. - albuterol (VENTOLIN HFA) 108 (90 Base) MCG/ACT inhaler; INHALE 2 PUFFS EVERY 6 HOURS AS NEEDED FOR WHEEZING  Dispense: 6.7 each; Refill: 1  2. Benign hypertension Chronic.  Controlled.  Stable.  Asymptomatic.  Blood pressure today is 120/80.  Asymptomatic tolerating medication well.  Continue amlodipine 10 mg once a day hydrochlorothiazide 50 mg once a day and losartan 25 mg 1 twice a day.  Will check CMP for electrolytes and GFR. - amLODipine (NORVASC) 10 MG tablet; Take 1 tablet (10 mg total) by mouth daily.  Dispense: 90 tablet; Refill: 1 - hydrochlorothiazide (HYDRODIURIL) 50 MG tablet; Take 1 tablet (50 mg total) by mouth daily.  Dispense: 90 tablet; Refill: 1 - losartan (COZAAR) 25 MG tablet; Take 1 tablet (25 mg total) by mouth 2 (two) times daily.  Dispense: 180 tablet; Refill: 1 - Comprehensive metabolic panel  3. Gastroesophageal reflux disease Chronic.  Controlled.  Stable.  Continue pantoprazole 40 mg once a day. - pantoprazole (PROTONIX) 40 MG tablet; Take 1 tablet (40 mg total) by mouth daily.  Dispense: 90 tablet; Refill: 1  4. Taking medication for chronic disease Taking simvastatin will check CMP for hepatic concerns. - simvastatin (ZOCOR) 20 MG tablet; Take 1 tablet (20 mg total) by mouth daily.  Dispense: 90 tablet; Refill: 1  5. Familial multiple lipoprotein-type hyperlipidemia Chronic.  Controlled.  Stable.  Asymptomatic without myalgias or muscle weakness.  Continue simvastatin at current dosing of 20 mg once a day.  Will recheck lipid panel for LDL control.  Will recheck patient in 6 months. - simvastatin (ZOCOR) 20 MG tablet; Take 1 tablet (20 mg total) by mouth daily.  Dispense: 90 tablet; Refill: 1 - Lipid Panel With LDL/HDL Ratio     Elizabeth Sauer, MD

## 2023-04-08 NOTE — Patient Instructions (Signed)

## 2023-04-09 ENCOUNTER — Encounter: Payer: Self-pay | Admitting: Family Medicine

## 2023-04-09 LAB — COMPREHENSIVE METABOLIC PANEL
ALT: 22 IU/L (ref 0–32)
AST: 18 IU/L (ref 0–40)
Albumin: 4.5 g/dL (ref 3.9–4.9)
Alkaline Phosphatase: 74 IU/L (ref 44–121)
BUN/Creatinine Ratio: 10 — ABNORMAL LOW (ref 12–28)
BUN: 9 mg/dL (ref 8–27)
Bilirubin Total: 0.3 mg/dL (ref 0.0–1.2)
CO2: 25 mmol/L (ref 20–29)
Calcium: 9.6 mg/dL (ref 8.7–10.3)
Chloride: 104 mmol/L (ref 96–106)
Creatinine, Ser: 0.87 mg/dL (ref 0.57–1.00)
Globulin, Total: 2.8 g/dL (ref 1.5–4.5)
Glucose: 90 mg/dL (ref 70–99)
Potassium: 4.2 mmol/L (ref 3.5–5.2)
Sodium: 145 mmol/L — ABNORMAL HIGH (ref 134–144)
Total Protein: 7.3 g/dL (ref 6.0–8.5)
eGFR: 74 mL/min/{1.73_m2} (ref >59)

## 2023-04-09 LAB — LIPID PANEL WITH LDL/HDL RATIO
Cholesterol, Total: 163 mg/dL (ref 100–199)
HDL: 59 mg/dL (ref >39)
LDL Chol Calc (NIH): 90 mg/dL (ref 0–99)
LDL/HDL Ratio: 1.5 ratio (ref 0.0–3.2)
Triglycerides: 70 mg/dL (ref 0–149)
VLDL Cholesterol Cal: 14 mg/dL (ref 5–40)

## 2023-04-12 DIAGNOSIS — H26493 Other secondary cataract, bilateral: Secondary | ICD-10-CM | POA: Diagnosis not present

## 2023-04-12 DIAGNOSIS — H35033 Hypertensive retinopathy, bilateral: Secondary | ICD-10-CM | POA: Diagnosis not present

## 2023-04-12 DIAGNOSIS — D860 Sarcoidosis of lung: Secondary | ICD-10-CM | POA: Diagnosis not present

## 2023-04-12 DIAGNOSIS — Z961 Presence of intraocular lens: Secondary | ICD-10-CM | POA: Diagnosis not present

## 2023-06-25 ENCOUNTER — Ambulatory Visit: Admitting: Family Medicine

## 2023-06-30 ENCOUNTER — Ambulatory Visit
Admission: EM | Admit: 2023-06-30 | Discharge: 2023-06-30 | Disposition: A | Discharge status: Home / Self Care | Attending: Emergency Medicine | Admitting: Emergency Medicine

## 2023-06-30 DIAGNOSIS — R051 Acute cough: Secondary | ICD-10-CM

## 2023-06-30 DIAGNOSIS — J22 Unspecified acute lower respiratory infection: Secondary | ICD-10-CM

## 2023-06-30 MED ORDER — DOXYCYCLINE HYCLATE 100 MG PO CAPS: 100.0000 mg | ORAL_CAPSULE | Freq: Two times a day (BID) | ORAL | 0 refills | Status: AC

## 2023-06-30 MED ORDER — BENZONATATE 100 MG PO CAPS: 100.0000 mg | ORAL_CAPSULE | Freq: Three times a day (TID) | ORAL | 0 refills | Status: DC

## 2023-06-30 NOTE — ED Provider Notes (Signed)
 [facility]    CSN: [id number] Arrival date & time: 06/30/23  1631      History   Chief Complaint Chief Complaint  Patient presents with   Cough   Nasal Congestion    HPI Susan Gregory is a 63 y.o. female.   63 year old, Susan Gregory, presents to urgent care for evaluation of cough and nasal congestion for over a week.  Patient states she has green phlegm and nasal discharge.  Patient states she has tried Mucinex  and over-the-counter medications including nasal spray without relief of symptoms   PMH: HTN, Hyperlipidemia, GERD,Patient states she has sarcoidosis of the lung, denies smoking or vaping or drug use  The history is provided by the patient. No language interpreter was used.    Past Medical History:  Diagnosis Date   Allergy    Barrett's esophagus    GERD (gastroesophageal reflux disease)    Hyperlipidemia    Hypertension     Patient Active Problem List   Diagnosis Date Noted   Acute respiratory infection 06/30/2023   Acute cough 06/30/2023   Gastroesophageal reflux disease 08/29/2020   BMI 35.0-35.9,adult 08/29/2020   Abnormal CT of the chest 11/22/2019   Familial multiple lipoprotein-type hyperlipidemia 07/18/2014   Disorder of lipoprotein and lipid metabolism 07/18/2014   Benign hypertension 07/18/2014   Acid reflux 07/18/2014   Encounter for general adult medical examination without abnormal findings 07/18/2014   Neck sprain and strain 07/18/2014   Abnormal breast finding 07/18/2014   Abnormal finding on breast imaging 07/18/2014   Arthritis of knee, degenerative 07/18/2014   TB skin/subcutaneous 07/18/2014   Fecal occult blood test positive 11/01/2013    Past Surgical History:  Procedure Laterality Date   BREAST BIOPSY Right 09/04/2016   US  guided right breast biopsy. Coil marker neg   BREAST BIOPSY Right 09/07/2016   stereo biopsy. Ribbon marker neg   COLONOSCOPY     COLONOSCOPY WITH PROPOFOL  N/A 11/18/2018   Procedure:  COLONOSCOPY WITH PROPOFOL ;  Surgeon: Deveron Fly, MD;  Location: [facility] ENDOSCOPY;  Service: Endoscopy;  Laterality: N/A;   ESOPHAGOGASTRODUODENOSCOPY (EGD) WITH PROPOFOL  N/A 04/20/2016   Procedure: ESOPHAGOGASTRODUODENOSCOPY (EGD) WITH PROPOFOL ;  Surgeon: Deveron Fly, MD;  Location: [facility] ENDOSCOPY;  Service: Endoscopy;  Laterality: N/A;   ESOPHAGOGASTRODUODENOSCOPY (EGD) WITH PROPOFOL  N/A 11/18/2018   Procedure: ESOPHAGOGASTRODUODENOSCOPY (EGD) WITH PROPOFOL ;  Surgeon: Deveron Fly, MD;  Location: [facility] ENDOSCOPY;  Service: Endoscopy;  Laterality: N/A;   THROAT SURGERY     nodules taken off throat   TUBAL LIGATION      OB History   No obstetric history on file.      Home Medications    Prior to Admission medications   Medication Sig Start Date End Date Taking? Authorizing Provider  acetaminophen (TYLENOL) 500 MG tablet Take 1,000 mg by mouth every 6 (six) hours as needed for moderate pain or headache.   Yes [provider]  albuterol  (VENTOLIN  HFA) 108 (90 Base) MCG/ACT inhaler INHALE 2 PUFFS EVERY 6 HOURS AS NEEDED FOR WHEEZING 04/08/23  Yes Clarise Crooks, MD  amLODipine  (NORVASC ) 10 MG tablet Take 1 tablet (10 mg total) by mouth daily. 04/08/23  Yes Clarise Crooks, MD  aspirin 81 MG tablet Take 81 mg by mouth daily.    Yes [provider]  benzonatate  (TESSALON ) 100 MG capsule Take 1 capsule (100 mg total) by mouth every 8 (eight) hours. 06/30/23  Yes Decker Cogdell, Eveleen Hinds, NP  Carboxymethylcellul-Glycerin (CLEAR EYES FOR DRY EYES  OP) Place 1 drop into both eyes daily as needed (dry eyes).   Yes [provider]  doxycycline (VIBRAMYCIN) 100 MG capsule Take 1 capsule (100 mg total) by mouth 2 (two) times daily for 7 days. 06/30/23 07/07/23 Yes Aliegha Paullin, Eveleen Hinds, NP  folic acid (FOLVITE) 1 MG tablet Take 1 mg by mouth daily. rheumatology 12/29/20  Yes [provider]  hydrochlorothiazide  (HYDRODIURIL ) 50 MG tablet Take 1 tablet (50 mg total)  by mouth daily. 04/08/23  Yes Clarise Crooks, MD  ketoconazole  (NIZORAL ) 2 % shampoo Apply 1 application topically daily as needed (fungus). 03/14/19  Yes [provider]  losartan  (COZAAR ) 25 MG tablet Take 1 tablet (25 mg total) by mouth 2 (two) times daily. 04/08/23  Yes Jones, Deanna C, MD  methotrexate (RHEUMATREX) 2.5 MG tablet Take 10 mg by mouth once a week. Rheumatology 02/23/21  Yes [provider]  Multiple Vitamin (MULTIVITAMIN WITH MINERALS) TABS tablet Take 1 tablet by mouth 2 (two) times a week.   Yes [provider]  pantoprazole  (PROTONIX ) 40 MG tablet Take 1 tablet (40 mg total) by mouth daily. 04/08/23  Yes Clarise Crooks, MD  simvastatin  (ZOCOR ) 20 MG tablet Take 1 tablet (20 mg total) by mouth daily. 04/08/23  Yes Clarise Crooks, MD  clobetasol  (TEMOVATE ) 0.05 % external solution APPLY TO AFFECTED AREA TWICE A DAY Patient not taking: Reported on 04/08/2023 03/13/19   Jones, Deanna C, MD  griseofulvin (GRIFULVIN V) 500 MG tablet Take 500 mg by mouth 2 (two) times daily as needed (fungus). Dr Tyrone Gallop Patient not taking: Reported on 04/08/2023    [provider]    Family History Family History  Problem Relation Age of Onset   Breast cancer Cousin        mat cousin    Social History Social History   Tobacco Use   Smoking status: Never   Smokeless tobacco: Never  Vaping Use   Vaping status: Never Used  Substance Use Topics   Alcohol use: No    Alcohol/week: 0.0 standard drinks of alcohol   Drug use: No     Allergies   Patient has no known allergies.   Review of Systems Review of Systems  Constitutional:  Negative for fever.  HENT:  Positive for congestion.   Respiratory:  Positive for cough.   All other systems reviewed and are negative.    Physical Exam Triage Vital Signs ED Triage Vitals  Encounter Vitals Group     BP 06/30/23 1706 128/79     Systolic BP Percentile --      Diastolic BP Percentile --      Pulse Rate  06/30/23 1706 83     Resp 06/30/23 1706 16     Temp 06/30/23 1706 99.3 F (37.4 C)     Temp Source 06/30/23 1706 Oral     SpO2 06/30/23 1706 96 %     Weight 06/30/23 1705 214 lb (97.1 kg)     Height 06/30/23 1705 5\' 4"  (1.626 m)     Head Circumference --      Peak Flow --      Pain Score 06/30/23 1707 0     Pain Loc --      Pain Education --      Exclude from Growth Chart --    No data found.  Updated Vital Signs BP 128/79 (BP Location: Right Arm)   Pulse 83   Temp 99.3 F (37.4 C) (Oral)  Resp 16   Ht 5\' 4"  (1.626 m)   Wt 214 lb (97.1 kg)   LMP  (LMP Unknown) Comment: menopausal, denies preg  SpO2 96%   BMI 36.73 kg/m   Visual Acuity Right Eye Distance:   Left Eye Distance:   Bilateral Distance:    Right Eye Near:   Left Eye Near:    Bilateral Near:     Physical Exam Vitals and nursing note reviewed.  Constitutional:      General: She is not in acute distress.    Appearance: She is well-developed and well-groomed.  HENT:     Head: Normocephalic and atraumatic.     Right Ear: Tympanic membrane is retracted.     Left Ear: Tympanic membrane is retracted.     Nose: Mucosal edema and congestion present.     Right Sinus: Maxillary sinus tenderness present.     Left Sinus: Maxillary sinus tenderness present.     Mouth/Throat:     Lips: Pink.     Mouth: Mucous membranes are moist.     Pharynx: Oropharynx is clear.  Eyes:     General: Lids are normal.     Conjunctiva/sclera: Conjunctivae normal.     Pupils: Pupils are equal, round, and reactive to light.  Neck:     Trachea: No tracheal deviation.  Cardiovascular:     Rate and Rhythm: Normal rate and regular rhythm.     Pulses: Normal pulses.     Heart sounds: Normal heart sounds. No murmur heard. Pulmonary:     Effort: Pulmonary effort is normal. No respiratory distress.     Breath sounds: Normal breath sounds and air entry.  Abdominal:     General: Bowel sounds are normal.     Palpations: Abdomen is  soft.     Tenderness: There is no abdominal tenderness.  Musculoskeletal:        General: No swelling. Normal range of motion.     Cervical back: Normal range of motion and neck supple.  Lymphadenopathy:     Cervical: No cervical adenopathy.  Skin:    General: Skin is warm and dry.     Capillary Refill: Capillary refill takes less than 2 seconds.     Findings: No rash.  Neurological:     General: No focal deficit present.     Mental Status: She is alert and oriented to person, place, and time.     GCS: GCS eye subscore is 4. GCS verbal subscore is 5. GCS motor subscore is 6.  Psychiatric:        Attention and Perception: Attention normal.        Mood and Affect: Mood normal.        Speech: Speech normal.        Behavior: Behavior normal. Behavior is cooperative.      UC Treatments / Results  Labs (all labs ordered are listed, but only abnormal results are displayed) Labs Reviewed - No data to display  EKG   Radiology No results found.  Procedures Procedures (including critical care time)  Medications Ordered in UC Medications - No data to display  Initial Impression / Assessment and Plan / UC Course  I have reviewed the triage vital signs and the nursing notes.  Pertinent labs & imaging results that were available during my care of the patient were reviewed by me and considered in my medical decision making (see chart for details).    Discussed exam findings and plan of care with  patient , will script  doxycycline and Tessalon  for acute respiratory infection will cover her chest and sinuses, strict go to ER precautions given, patient verbalized understanding this provider  Ddx: Acute respiratory infection, cough, allergies, sarcoidosis, pneumonia Final Clinical Impressions(s) / UC Diagnoses   Final diagnoses:  Acute respiratory infection  Acute cough     Discharge Instructions      Take antibiotic as directed Take tessalon  as prescribed for cough Drink  plenty of water Follow-up with your PCP, if you have new or worsening symptoms go to the emergency room for further evaluation   ED Prescriptions     Medication Sig Dispense Auth. Provider   doxycycline (VIBRAMYCIN) 100 MG capsule Take 1 capsule (100 mg total) by mouth 2 (two) times daily for 7 days. 14 capsule Eiden Bagot, NP   benzonatate  (TESSALON ) 100 MG capsule Take 1 capsule (100 mg total) by mouth every 8 (eight) hours. 21 capsule Kaelie Henigan, NP      PDMP not reviewed this encounter.   Peter Brands, NP 06/30/23 2013

## 2023-06-30 NOTE — ED Triage Notes (Signed)
 Pt c/o cough & congestion x8 days. Has tried mucinex  & theraflu w/o relief.

## 2023-06-30 NOTE — Discharge Instructions (Addendum)
 Take antibiotic as directed Take tessalon  as prescribed for cough Drink plenty of water Follow-up with your PCP, if you have new or worsening symptoms go to the emergency room for further evaluation

## 2023-07-07 DIAGNOSIS — Z79899 Other long term (current) drug therapy: Secondary | ICD-10-CM | POA: Diagnosis not present

## 2023-07-07 DIAGNOSIS — Z1331 Encounter for screening for depression: Secondary | ICD-10-CM | POA: Diagnosis not present

## 2023-07-07 DIAGNOSIS — D860 Sarcoidosis of lung: Secondary | ICD-10-CM | POA: Diagnosis not present

## 2023-07-28 ENCOUNTER — Ambulatory Visit
Admission: EM | Admit: 2023-07-28 | Discharge: 2023-07-28 | Disposition: A | Discharge status: Home / Self Care | Attending: Family Medicine | Admitting: Family Medicine

## 2023-07-28 ENCOUNTER — Ambulatory Visit (INDEPENDENT_AMBULATORY_CARE_PROVIDER_SITE_OTHER)

## 2023-07-28 ENCOUNTER — Ambulatory Visit: Payer: Self-pay | Admitting: Family Medicine

## 2023-07-28 DIAGNOSIS — J40 Bronchitis, not specified as acute or chronic: Secondary | ICD-10-CM | POA: Diagnosis not present

## 2023-07-28 DIAGNOSIS — J471 Bronchiectasis with (acute) exacerbation: Secondary | ICD-10-CM

## 2023-07-28 DIAGNOSIS — J479 Bronchiectasis, uncomplicated: Secondary | ICD-10-CM | POA: Diagnosis not present

## 2023-07-28 DIAGNOSIS — D869 Sarcoidosis, unspecified: Secondary | ICD-10-CM | POA: Diagnosis not present

## 2023-07-28 DIAGNOSIS — J984 Other disorders of lung: Secondary | ICD-10-CM | POA: Diagnosis not present

## 2023-07-28 DIAGNOSIS — R059 Cough, unspecified: Secondary | ICD-10-CM | POA: Diagnosis not present

## 2023-07-28 MED ORDER — PREDNISONE 10 MG (21) PO TBPK: ORAL_TABLET | Freq: Every day | ORAL | 0 refills | Status: DC

## 2023-07-28 MED ORDER — HYDROCOD POLI-CHLORPHE POLI ER 10-8 MG/5ML PO SUER: 5.0000 mL | Freq: Two times a day (BID) | ORAL | 0 refills | Status: DC | PRN

## 2023-07-28 MED ORDER — AZITHROMYCIN 250 MG PO TABS: ORAL_TABLET | ORAL | 0 refills | Status: DC

## 2023-07-28 NOTE — ED Provider Notes (Signed)
 [facility]    CSN: [id number] Arrival date & time: 07/28/23  1626      History   Chief Complaint Chief Complaint  Patient presents with   Cough   Fatigue    HPI Susan Gregory is a 63 y.o. female.   HPI  History obtained from the patient. Nekeisha presents for mostly dry cough for the past 3-4 days. The cough is causing body aches. She feels weak and feeling feverish. No shortness of breath, chest pain, chest tightness, vomiting, diarrhea or rhinorrhea. Nothing taken for the cough recently. Took doxycycline  with a little bit of relief about a month ago.  No recent sick contacts.   Has sarcoidosis. Denies vaping and smoking.       Past Medical History:  Diagnosis Date   Allergy    Barrett's esophagus    GERD (gastroesophageal reflux disease)    Hyperlipidemia    Hypertension     Patient Active Problem List   Diagnosis Date Noted   Acute respiratory infection 06/30/2023   Acute cough 06/30/2023   Gastroesophageal reflux disease 08/29/2020   BMI 35.0-35.9,adult 08/29/2020   Abnormal CT of the chest 11/22/2019   Familial multiple lipoprotein-type hyperlipidemia 07/18/2014   Disorder of lipoprotein and lipid metabolism 07/18/2014   Benign hypertension 07/18/2014   Acid reflux 07/18/2014   Encounter for general adult medical examination without abnormal findings 07/18/2014   Neck sprain and strain 07/18/2014   Abnormal breast finding 07/18/2014   Abnormal finding on breast imaging 07/18/2014   Arthritis of knee, degenerative 07/18/2014   TB skin/subcutaneous 07/18/2014   Fecal occult blood test positive 11/01/2013    Past Surgical History:  Procedure Laterality Date   BREAST BIOPSY Right 09/04/2016   US  guided right breast biopsy. Coil marker neg   BREAST BIOPSY Right 09/07/2016   stereo biopsy. Ribbon marker neg   COLONOSCOPY     COLONOSCOPY WITH PROPOFOL  N/A 11/18/2018   Procedure: COLONOSCOPY WITH PROPOFOL ;  Surgeon: Deveron Fly, MD;   Location: [facility] ENDOSCOPY;  Service: Endoscopy;  Laterality: N/A;   ESOPHAGOGASTRODUODENOSCOPY (EGD) WITH PROPOFOL  N/A 04/20/2016   Procedure: ESOPHAGOGASTRODUODENOSCOPY (EGD) WITH PROPOFOL ;  Surgeon: Deveron Fly, MD;  Location: [facility] ENDOSCOPY;  Service: Endoscopy;  Laterality: N/A;   ESOPHAGOGASTRODUODENOSCOPY (EGD) WITH PROPOFOL  N/A 11/18/2018   Procedure: ESOPHAGOGASTRODUODENOSCOPY (EGD) WITH PROPOFOL ;  Surgeon: Deveron Fly, MD;  Location: [facility] ENDOSCOPY;  Service: Endoscopy;  Laterality: N/A;   THROAT SURGERY     nodules taken off throat   TUBAL LIGATION      OB History   No obstetric history on file.      Home Medications    Prior to Admission medications   Medication Sig Start Date End Date Taking? Authorizing Provider  azithromycin  (ZITHROMAX  Z-PAK) 250 MG tablet Take 2 tablets on day 1 then 1 tablet daily 07/28/23  Yes Ahan Eisenberger, DO  chlorpheniramine-HYDROcodone (TUSSIONEX) 10-8 MG/5ML Take 5 mLs by mouth every 12 (twelve) hours as needed. 07/28/23  Yes Hassaan Crite, DO  predniSONE  (STERAPRED UNI-PAK 21 TAB) 10 MG (21) TBPK tablet Take by mouth daily. Take 6 tabs by mouth daily for 1, then 5 tabs for 1 day, then 4 tabs for 1 day, then 3 tabs for 1 day, then 2 tabs for 1 day, then 1 tab for 1 day. 07/28/23  Yes Sharhonda Atwood, DO  acetaminophen (TYLENOL) 500 MG tablet Take 1,000 mg by mouth every 6 (six) hours as needed for moderate pain or headache.  [provider]  albuterol  (VENTOLIN  HFA) 108 (90 Base) MCG/ACT inhaler INHALE 2 PUFFS EVERY 6 HOURS AS NEEDED FOR WHEEZING 04/08/23   Clarise Crooks, MD  amLODipine  (NORVASC ) 10 MG tablet Take 1 tablet (10 mg total) by mouth daily. 04/08/23   Clarise Crooks, MD  aspirin 81 MG tablet Take 81 mg by mouth daily.     [provider]  benzonatate  (TESSALON ) 100 MG capsule Take 1 capsule (100 mg total) by mouth every 8 (eight) hours. 06/30/23   Defelice, Jeanette, NP  Carboxymethylcellul-Glycerin (CLEAR  EYES FOR DRY EYES OP) Place 1 drop into both eyes daily as needed (dry eyes).    [provider]  clobetasol  (TEMOVATE ) 0.05 % external solution APPLY TO AFFECTED AREA TWICE A DAY Patient not taking: Reported on 04/08/2023 03/13/19   Clarise Crooks, MD  folic acid (FOLVITE) 1 MG tablet Take 1 mg by mouth daily. rheumatology 12/29/20   [provider]  griseofulvin (GRIFULVIN V) 500 MG tablet Take 500 mg by mouth 2 (two) times daily as needed (fungus). Dr Tyrone Gallop Patient not taking: Reported on 04/08/2023    [provider]  hydrochlorothiazide  (HYDRODIURIL ) 50 MG tablet Take 1 tablet (50 mg total) by mouth daily. 04/08/23   Clarise Crooks, MD  ketoconazole  (NIZORAL ) 2 % shampoo Apply 1 application topically daily as needed (fungus). 03/14/19   [provider]  losartan  (COZAAR ) 25 MG tablet Take 1 tablet (25 mg total) by mouth 2 (two) times daily. 04/08/23   Clarise Crooks, MD  methotrexate (RHEUMATREX) 2.5 MG tablet Take 10 mg by mouth once a week. Rheumatology 02/23/21   [provider]  Multiple Vitamin (MULTIVITAMIN WITH MINERALS) TABS tablet Take 1 tablet by mouth 2 (two) times a week.    [provider]  pantoprazole  (PROTONIX ) 40 MG tablet Take 1 tablet (40 mg total) by mouth daily. 04/08/23   Clarise Crooks, MD  simvastatin  (ZOCOR ) 20 MG tablet Take 1 tablet (20 mg total) by mouth daily. 04/08/23   Clarise Crooks, MD    Family History Family History  Problem Relation Age of Onset   Breast cancer Cousin        mat cousin    Social History Social History   Tobacco Use   Smoking status: Never   Smokeless tobacco: Never  Vaping Use   Vaping status: Never Used  Substance Use Topics   Alcohol use: No    Alcohol/week: 0.0 standard drinks of alcohol   Drug use: No     Allergies   Patient has no known allergies.   Review of Systems Review of Systems: negative unless otherwise stated in HPI.      Physical Exam Triage Vital  Signs ED Triage Vitals  Encounter Vitals Group     BP 07/28/23 1636 127/74     Girls Systolic BP Percentile --      Girls Diastolic BP Percentile --      Boys Systolic BP Percentile --      Boys Diastolic BP Percentile --      Pulse Rate 07/28/23 1636 90     Resp 07/28/23 1636 16     Temp 07/28/23 1636 99.9 F (37.7 C)     Temp Source 07/28/23 1636 Oral     SpO2 07/28/23 1636 94 %     Weight 07/28/23 1636 214 lb (97.1 kg)     Height 07/28/23 1636 5' 4 (1.626 m)     Head  Circumference --      Peak Flow --      Pain Score 07/28/23 1641 5     Pain Loc --      Pain Education --      Exclude from Growth Chart --    No data found.  Updated Vital Signs BP 127/74 (BP Location: Right Arm)   Pulse 90   Temp 99.9 F (37.7 C) (Oral)   Resp 16   Ht 5' 4 (1.626 m)   Wt 97.1 kg   LMP  (LMP Unknown) Comment: menopausal, denies preg  SpO2 94%   BMI 36.73 kg/m   Visual Acuity Right Eye Distance:   Left Eye Distance:   Bilateral Distance:    Right Eye Near:   Left Eye Near:    Bilateral Near:     Physical Exam GEN:     alert, non-toxic appearing female in no distress    HENT:  mucus membranes moist, no nasal discharge EYES:  no scleral injection or discharge RESP:  no increased work of breathing, coarse breathe sounds bilaterally, frequent cough CVS:   regular rate and rhythm Skin:   warm and dry, no rash on visible skin    UC Treatments / Results  Labs (all labs ordered are listed, but only abnormal results are displayed) Labs Reviewed - No data to display  EKG   Radiology DG Chest 2 View Result Date: 07/28/2023 CLINICAL DATA:  Worsening cough EXAM: CHEST - 2 VIEW COMPARISON:  04/01/2022, chest CT 06/25/2021 FINDINGS: Chronic lung disease with bronchiectasis and scattered interstitial densities, grossly similar pattern as compared with 04/01/2022. No definite acute superimposed confluent airspace disease. Stable cardiomediastinal silhouette. No pneumothorax  IMPRESSION: Chronic lung disease with bronchiectasis and scattered interstitial densities, grossly similar pattern as compared with 04/01/2022. No definite acute superimposed confluent airspace disease. Electronically Signed   By: Esmeralda Hedge M.D.   On: 07/28/2023 17:46    Procedures Procedures (including critical care time)  Medications Ordered in UC Medications - No data to display  Initial Impression / Assessment and Plan / UC Course  I have reviewed the triage vital signs and the nursing notes.  Pertinent labs & imaging results that were available during my care of the patient were reviewed by me and considered in my medical decision making (see chart for details).       Pt is a 63 y.o. female who has pulmonary sarcoidosis presents for worsening cough.  Anihya has an elevated temperature here without recent antipyretics. Satting 94%on room air. Overall pt is  non-toxic appearing, well hydrated, without respiratory distress. Pulmonary exam is remarkable for coarse breathe sounds with cough.  After shared decision making, we will pursue chest x-ray at this time as it currently would not change management.  COVID  and influenza testing deferred due to length of symptoms.   Chest xray personally reviewed by me without focal pneumonia, pleural effusion, cardiomegaly or pneumothorax but does have lung space disease somewhat similar to her previous chest xray. Patient aware the radiologist has not read her xray and is comfortable with the preliminary read by me. Will review radiologist read when available and call patient if a change in plan is warranted.  Pt agreeable to this plan prior to discharge.    Treat acute exacerbation of her lung disease with steroids and antibiotics as below.  Tussionex cough syrup given for cough and allow patient to rest.  Typical duration of symptoms discussed. Return and ED precautions given and  patient voiced understanding.   Discussed MDM, treatment plan  and plan for follow-up with patient who agrees with plan.   Radiologist impression reviewed. Pt with bronchiectasis in setting of sarcoidosis. No change in plan warranted. Follow up with her pulmonologist.    Final Clinical Impressions(s) / UC Diagnoses   Final diagnoses:  Sarcoidosis  Bronchiectasis with acute exacerbation [facility])     Discharge Instructions      Your chest xray did not show evidence of pneumonia though the radiologist has not yet read it. If they find something that I didn't, I will call you.    Take the antibiotics and steroids as prescribed.        ED Prescriptions     Medication Sig Dispense Auth. Provider   predniSONE  (STERAPRED UNI-PAK 21 TAB) 10 MG (21) TBPK tablet Take by mouth daily. Take 6 tabs by mouth daily for 1, then 5 tabs for 1 day, then 4 tabs for 1 day, then 3 tabs for 1 day, then 2 tabs for 1 day, then 1 tab for 1 day. 21 tablet Isai Gottlieb, DO   azithromycin  (ZITHROMAX  Z-PAK) 250 MG tablet Take 2 tablets on day 1 then 1 tablet daily 6 tablet Keyoni Lapinski, DO   chlorpheniramine-HYDROcodone (TUSSIONEX) 10-8 MG/5ML Take 5 mLs by mouth every 12 (twelve) hours as needed. 115 mL Ahriyah Vannest, DO      I have reviewed the PDMP during this encounter.   Wally Behan, DO 07/28/23 1824

## 2023-07-28 NOTE — Discharge Instructions (Signed)
 Your chest xray did not show evidence of pneumonia though the radiologist has not yet read it. If they find something that I didn't, I will call you.    Take the antibiotics and steroids as prescribed.

## 2023-07-28 NOTE — ED Triage Notes (Signed)
 Pt c/o cough,fatigue & back/neck pain x4 days. Was seen on 5/21 for the same issue. Given doxy w/o relief.

## 2023-09-09 DIAGNOSIS — R899 Unspecified abnormal finding in specimens from other organs, systems and tissues: Secondary | ICD-10-CM | POA: Diagnosis not present

## 2023-09-09 DIAGNOSIS — D860 Sarcoidosis of lung: Secondary | ICD-10-CM | POA: Diagnosis not present

## 2023-09-09 DIAGNOSIS — Z7960 Long term (current) use of unspecified immunomodulators and immunosuppressants: Secondary | ICD-10-CM | POA: Diagnosis not present

## 2023-09-29 ENCOUNTER — Encounter: Payer: Self-pay | Admitting: *Deleted

## 2023-09-29 ENCOUNTER — Ambulatory Visit
Admission: EM | Admit: 2023-09-29 | Discharge: 2023-09-29 | Disposition: A | Discharge status: Home / Self Care | Attending: Physician Assistant | Admitting: Physician Assistant

## 2023-09-29 ENCOUNTER — Ambulatory Visit (INDEPENDENT_AMBULATORY_CARE_PROVIDER_SITE_OTHER)

## 2023-09-29 DIAGNOSIS — J45901 Unspecified asthma with (acute) exacerbation: Secondary | ICD-10-CM

## 2023-09-29 DIAGNOSIS — D860 Sarcoidosis of lung: Secondary | ICD-10-CM | POA: Diagnosis not present

## 2023-09-29 DIAGNOSIS — R0989 Other specified symptoms and signs involving the circulatory and respiratory systems: Secondary | ICD-10-CM | POA: Diagnosis not present

## 2023-09-29 DIAGNOSIS — R051 Acute cough: Secondary | ICD-10-CM

## 2023-09-29 DIAGNOSIS — U071 COVID-19: Secondary | ICD-10-CM

## 2023-09-29 DIAGNOSIS — R059 Cough, unspecified: Secondary | ICD-10-CM | POA: Diagnosis not present

## 2023-09-29 DIAGNOSIS — J984 Other disorders of lung: Secondary | ICD-10-CM | POA: Diagnosis not present

## 2023-09-29 DIAGNOSIS — J479 Bronchiectasis, uncomplicated: Secondary | ICD-10-CM | POA: Diagnosis not present

## 2023-09-29 LAB — BASIC METABOLIC PANEL WITH GFR
Anion gap: 10 (ref 5–15)
BUN: 11 mg/dL (ref 8–23)
CO2: 28 mmol/L (ref 22–32)
Calcium: 9.2 mg/dL (ref 8.9–10.3)
Chloride: 99 mmol/L (ref 98–111)
Creatinine, Ser: 0.79 mg/dL (ref 0.44–1.00)
GFR, Estimated: >60 mL/min (ref >60)
Glucose, Bld: 87 mg/dL (ref 70–99)
Potassium: 3.1 mmol/L — ABNORMAL LOW (ref 3.5–5.1)
Sodium: 137 mmol/L (ref 135–145)

## 2023-09-29 LAB — SARS CORONAVIRUS 2 BY RT PCR: SARS Coronavirus 2 by RT PCR: POSITIVE — AB

## 2023-09-29 MED ORDER — PAXLOVID (300/100) 20 X 150 MG & 10 X 100MG PO TBPK: 3.0000 | ORAL_TABLET | Freq: Two times a day (BID) | ORAL | 0 refills | Status: AC

## 2023-09-29 MED ORDER — PREDNISONE 20 MG PO TABS: 40.0000 mg | ORAL_TABLET | Freq: Every day | ORAL | 0 refills | Status: AC

## 2023-09-29 MED ORDER — PROMETHAZINE-DM 6.25-15 MG/5ML PO SYRP: 5.0000 mL | ORAL_SOLUTION | Freq: Four times a day (QID) | ORAL | 0 refills | Status: DC | PRN

## 2023-09-29 NOTE — Discharge Instructions (Signed)
-   Positive COVID test.  Isolate until fever free 24 hours and symptoms improving or isolate 5 days from symptom onset and wear mask x 5 days. Nausea sent cough medicine to the pharmacy. - I printed you a prescription for prednisone  in case you feel that you are more short of breath and having increased wheezing related to reactive airway disease flareup.  Use albuterol  inhaler couple times a day and as needed for wheezing and shortness of breath. - Paxlovid  antiviral medicine sent to pharmacy.  Do not take your simvastatin  cholesterol medication for 10 days from today. - You need to be seen again if uncontrolled fever, weakness, chest pain or increased shortness of breath.

## 2023-09-29 NOTE — ED Provider Notes (Signed)
 [facility]    CSN: [id number] Arrival date & time: 09/29/23  1757      History   Chief Complaint Chief Complaint  Patient presents with   bodyaches   Cough    HPI DELLENE Gregory is a 63 y.o. female with history pulmonary sarcoidosis, reactive airway disease, GERD, hypertension and use of long term immunosuppressants. Today, she is presenting for fatigue, body aches, dry cough and congestion that began yesterday. Reports mild shortness of breath. No fever, sore throat, chest pain, diarrhea or vomiting. No sick contacts. Has taken Alka Seltzer. Patient says she was really sick a couple months ago and wanted to catch things sooner this time.   HPI  Past Medical History:  Diagnosis Date   Allergy    Barrett's esophagus    GERD (gastroesophageal reflux disease)    Hyperlipidemia    Hypertension     Patient Active Problem List   Diagnosis Date Noted   Acute respiratory infection 06/30/2023   Acute cough 06/30/2023   Gastroesophageal reflux disease 08/29/2020   BMI 35.0-35.9,adult 08/29/2020   Abnormal CT of the chest 11/22/2019   Familial multiple lipoprotein-type hyperlipidemia 07/18/2014   Disorder of lipoprotein and lipid metabolism 07/18/2014   Benign hypertension 07/18/2014   Acid reflux 07/18/2014   Encounter for general adult medical examination without abnormal findings 07/18/2014   Neck sprain and strain 07/18/2014   Abnormal breast finding 07/18/2014   Abnormal finding on breast imaging 07/18/2014   Arthritis of knee, degenerative 07/18/2014   TB skin/subcutaneous 07/18/2014   Fecal occult blood test positive 11/01/2013    Past Surgical History:  Procedure Laterality Date   BREAST BIOPSY Right 09/04/2016   US  guided right breast biopsy. Coil marker neg   BREAST BIOPSY Right 09/07/2016   stereo biopsy. Ribbon marker neg   COLONOSCOPY     COLONOSCOPY WITH PROPOFOL  N/A 11/18/2018   Procedure: COLONOSCOPY WITH PROPOFOL ;  Surgeon: Gaylyn Gladis PENNER, MD;  Location: [facility] ENDOSCOPY;  Service: Endoscopy;  Laterality: N/A;   ESOPHAGOGASTRODUODENOSCOPY (EGD) WITH PROPOFOL  N/A 04/20/2016   Procedure: ESOPHAGOGASTRODUODENOSCOPY (EGD) WITH PROPOFOL ;  Surgeon: Gladis PENNER Gaylyn, MD;  Location: [facility] ENDOSCOPY;  Service: Endoscopy;  Laterality: N/A;   ESOPHAGOGASTRODUODENOSCOPY (EGD) WITH PROPOFOL  N/A 11/18/2018   Procedure: ESOPHAGOGASTRODUODENOSCOPY (EGD) WITH PROPOFOL ;  Surgeon: Gaylyn Gladis PENNER, MD;  Location: [facility] ENDOSCOPY;  Service: Endoscopy;  Laterality: N/A;   THROAT SURGERY     nodules taken off throat   TUBAL LIGATION      OB History   No obstetric history on file.      Home Medications    Prior to Admission medications   Medication Sig Start Date End Date Taking? Authorizing Provider  nirmatrelvir/ritonavir (PAXLOVID , 300/100,) 20 x 150 MG & 10 x 100MG  TBPK Take 3 tablets by mouth 2 (two) times daily for 5 days. Patient GFR is over 60. Take nirmatrelvir (150 mg) two tablets twice daily for 5 days and ritonavir (100 mg) one tablet twice daily for 5 days. 09/29/23 10/04/23 Yes Arvis Jolan NOVAK, PA-C  predniSONE  (DELTASONE ) 20 MG tablet Take 2 tablets (40 mg total) by mouth daily for 5 days. 09/29/23 10/04/23 Yes Arvis Jolan B, PA-C  promethazine -dextromethorphan (PROMETHAZINE -DM) 6.25-15 MG/5ML syrup Take 5 mLs by mouth 4 (four) times daily as needed. 09/29/23  Yes Arvis Jolan NOVAK, PA-C  acetaminophen (TYLENOL) 500 MG tablet Take 1,000 mg by mouth every 6 (six) hours as needed for moderate pain or headache.    [provider]  albuterol  (VENTOLIN  HFA) 108 (90 Base) MCG/ACT inhaler INHALE 2 PUFFS EVERY 6 HOURS AS NEEDED FOR WHEEZING 04/08/23   Joshua Cathryne BROCKS, MD  amLODipine  (NORVASC ) 10 MG tablet Take 1 tablet (10 mg total) by mouth daily. 04/08/23   Joshua Cathryne BROCKS, MD  aspirin 81 MG tablet Take 81 mg by mouth daily.     [provider]  Carboxymethylcellul-Glycerin (CLEAR EYES FOR DRY EYES OP) Place 1 drop into both  eyes daily as needed (dry eyes).    [provider]  clobetasol  (TEMOVATE ) 0.05 % external solution APPLY TO AFFECTED AREA TWICE A DAY Patient not taking: Reported on 04/08/2023 03/13/19   Joshua Cathryne BROCKS, MD  folic acid (FOLVITE) 1 MG tablet Take 1 mg by mouth daily. rheumatology 12/29/20   [provider]  hydrochlorothiazide  (HYDRODIURIL ) 50 MG tablet Take 1 tablet (50 mg total) by mouth daily. 04/08/23   Joshua Cathryne BROCKS, MD  ketoconazole  (NIZORAL ) 2 % shampoo Apply 1 application topically daily as needed (fungus). 03/14/19   [provider]  losartan  (COZAAR ) 25 MG tablet Take 1 tablet (25 mg total) by mouth 2 (two) times daily. 04/08/23   Joshua Cathryne BROCKS, MD  methotrexate (RHEUMATREX) 2.5 MG tablet Take 10 mg by mouth once a week. Rheumatology 02/23/21   [provider]  Multiple Vitamin (MULTIVITAMIN WITH MINERALS) TABS tablet Take 1 tablet by mouth 2 (two) times a week.    [provider]  pantoprazole  (PROTONIX ) 40 MG tablet Take 1 tablet (40 mg total) by mouth daily. 04/08/23   Joshua Cathryne BROCKS, MD  simvastatin  (ZOCOR ) 20 MG tablet Take 1 tablet (20 mg total) by mouth daily. 04/08/23   Joshua Cathryne BROCKS, MD    Family History Family History  Problem Relation Age of Onset   Breast cancer Cousin        mat cousin    Social History Social History   Tobacco Use   Smoking status: Never   Smokeless tobacco: Never  Vaping Use   Vaping status: Never Used  Substance Use Topics   Alcohol use: No    Alcohol/week: 0.0 standard drinks of alcohol   Drug use: No     Allergies   Patient has no known allergies.   Review of Systems Review of Systems  Constitutional:  Positive for fatigue. Negative for chills, diaphoresis and fever.  HENT:  Positive for congestion. Negative for ear pain, rhinorrhea, sinus pressure, sinus pain and sore throat.   Respiratory:  Positive for cough and shortness of breath.   Cardiovascular:  Negative for chest pain.   Gastrointestinal:  Negative for abdominal pain, nausea and vomiting.  Musculoskeletal:  Positive for myalgias.  Skin:  Negative for rash.  Neurological:  Negative for weakness and headaches.  Hematological:  Negative for adenopathy.     Physical Exam Triage Vital Signs ED Triage Vitals  Encounter Vitals Group     BP 09/29/23 1812 133/87     Girls Systolic BP Percentile --      Girls Diastolic BP Percentile --      Boys Systolic BP Percentile --      Boys Diastolic BP Percentile --      Pulse Rate 09/29/23 1812 92     Resp 09/29/23 1812 20     Temp 09/29/23 1812 99 F (37.2 C)     Temp Source 09/29/23 1812 Oral     SpO2 09/29/23 1812 98 %     Weight 09/29/23 1808 214 lb (97.1  kg)     Height 09/29/23 1808 5' 4 (1.626 m)     Head Circumference --      Peak Flow --      Pain Score 09/29/23 1808 3     Pain Loc --      Pain Education --      Exclude from Growth Chart --    No data found.  Updated Vital Signs BP 133/87 (BP Location: Right Arm)   Pulse 92   Temp 99 F (37.2 C) (Oral)   Resp 20   Ht 5' 4 (1.626 m)   Wt 214 lb (97.1 kg)   LMP  (LMP Unknown) Comment: menopausal, denies preg  SpO2 98%   BMI 36.73 kg/m   Physical Exam Vitals and nursing note reviewed.  Constitutional:      General: She is not in acute distress.    Appearance: Normal appearance. She is not ill-appearing or toxic-appearing.  HENT:     Head: Normocephalic and atraumatic.     Nose: Congestion present.     Mouth/Throat:     Mouth: Mucous membranes are moist.     Pharynx: Oropharynx is clear.  Eyes:     General: No scleral icterus.       Right eye: No discharge.        Left eye: No discharge.     Conjunctiva/sclera: Conjunctivae normal.  Cardiovascular:     Rate and Rhythm: Normal rate and regular rhythm.     Heart sounds: Normal heart sounds.  Pulmonary:     Effort: Pulmonary effort is normal. No respiratory distress.     Breath sounds: Wheezing (right upper lung fields) present.   Musculoskeletal:     Cervical back: Neck supple.  Skin:    General: Skin is dry.  Neurological:     General: No focal deficit present.     Mental Status: She is alert. Mental status is at baseline.     Motor: No weakness.     Gait: Gait normal.  Psychiatric:        Mood and Affect: Mood normal.        Behavior: Behavior normal.      UC Treatments / Results  Labs (all labs ordered are listed, but only abnormal results are displayed) Labs Reviewed  SARS CORONAVIRUS 2 BY RT PCR - Abnormal; Notable for the following components:      Result Value   SARS Coronavirus 2 by RT PCR POSITIVE (*)    All other components within normal limits  BASIC METABOLIC PANEL WITH GFR - Abnormal; Notable for the following components:   Potassium 3.1 (*)    All other components within normal limits    EKG   Radiology DG Chest 2 View Result Date: 09/29/2023 CLINICAL DATA:  Dry cough, congestion, body aches EXAM: CHEST - 2 VIEW COMPARISON:  07/28/2023 FINDINGS: Stable cardiomediastinal silhouette. Chronic lung disease with bronchiectasis and interstitial coarsening greatest in the lower lungs. No significant change from 07/28/2023. No pleural effusion or pneumothorax. Right apical pleural-parenchymal scarring. IMPRESSION: Chronic lung disease with bronchiectasis and interstitial coarsening similar to prior. Electronically Signed   By: Norman Gatlin M.D.   On: 09/29/2023 18:53    Procedures Procedures (including critical care time)  Medications Ordered in UC Medications - No data to display  Initial Impression / Assessment and Plan / UC Course  I have reviewed the triage vital signs and the nursing notes.  Pertinent labs & imaging results that were available during  my care of the patient were reviewed by me and considered in my medical decision making (see chart for details).   63 y/o female with history of pulmonary sarcoidosis, reactive airway disease, hypertension, and GERD presents for  onset of fatigue, cough, body aches, mild SOB, and congestion that began yesterday. No fever, chest pain or GI symptoms.   Vitals stable and normal. Patient is overall well appearing. On exam, has nasal congestion. Throat is clear. Few wheezes right upper lung fields. Heart RRR.  PCR COVID test and chest x-ray ordered.   +COVID 19. Discussed benefits and side effects of Paxlovid  with patient. She would like to take this medication. Last GFR nearly 6 months ago. Will obtain BMP today.   Current CDC guidelines, isolation protocol, and ED precautions for COVID-19.  Sent promethazine  DM to pharmacy. Printed prescription for prednisone  if RAD flares up. Discussed using albuterol  a couple times daily and PRN for SOB/wheezing.  Normal GFR. Regular dose of Paxlovid  sent. Advised holding simvastatin  for 10 days. Reviewed return precautions.   Acute illness with systemic symptoms.    Final Clinical Impressions(s) / UC Diagnoses   Final diagnoses:  Acute cough  COVID-19  Reactive airway disease with acute exacerbation, unspecified asthma severity, unspecified whether persistent  Sarcoidosis of lung (HCC)     Discharge Instructions      - Positive COVID test.  Isolate until fever free 24 hours and symptoms improving or isolate 5 days from symptom onset and wear mask x 5 days. Nausea sent cough medicine to the pharmacy. - I printed you a prescription for prednisone  in case you feel that you are more short of breath and having increased wheezing related to reactive airway disease flareup.  Use albuterol  inhaler couple times a day and as needed for wheezing and shortness of breath. - Paxlovid  antiviral medicine sent to pharmacy.  Do not take your simvastatin  cholesterol medication for 10 days from today. - You need to be seen again if uncontrolled fever, weakness, chest pain or increased shortness of breath.     ED Prescriptions     Medication Sig Dispense Auth. Provider    promethazine -dextromethorphan (PROMETHAZINE -DM) 6.25-15 MG/5ML syrup Take 5 mLs by mouth 4 (four) times daily as needed. 118 mL Arvis Huxley B, PA-C   predniSONE  (DELTASONE ) 20 MG tablet Take 2 tablets (40 mg total) by mouth daily for 5 days. 10 tablet Arvis Huxley NOVAK, PA-C   nirmatrelvir/ritonavir (PAXLOVID , 300/100,) 20 x 150 MG & 10 x 100MG  TBPK Take 3 tablets by mouth 2 (two) times daily for 5 days. Patient GFR is over 60. Take nirmatrelvir (150 mg) two tablets twice daily for 5 days and ritonavir (100 mg) one tablet twice daily for 5 days. 30 tablet Jillene Wehrenberg B, PA-C      PDMP not reviewed this encounter.   Arvis Huxley NOVAK, PA-C 09/29/23 1932

## 2023-09-29 NOTE — ED Triage Notes (Signed)
 Patient states dry cough since yesterday, body aches today, states she's trying to catch whatever it is in time.  Hx of sarcoidosis.  Has tried Alka Seltzer once

## 2023-09-30 ENCOUNTER — Other Ambulatory Visit: Payer: Self-pay | Admitting: Student

## 2023-09-30 ENCOUNTER — Telehealth: Payer: Self-pay | Admitting: Family Medicine

## 2023-09-30 DIAGNOSIS — Z1231 Encounter for screening mammogram for malignant neoplasm of breast: Secondary | ICD-10-CM

## 2023-09-30 NOTE — Telephone Encounter (Signed)
 Called pt to reschedule appt in correct transfer of care slot. Susan Gregory, is rescheduled, but she said she normally has her mammograms done is August and she was waiting to see Dr. Lemon so she could get that done. Pt  would like to know if she will be able to still have her mammogram done this month.

## 2023-09-30 NOTE — Telephone Encounter (Signed)
 Patient has been made aware that order has been placed.

## 2023-09-30 NOTE — Telephone Encounter (Signed)
 Please review if we can order her mammogram before her next appointment.

## 2023-10-06 ENCOUNTER — Ambulatory Visit: Payer: BC Managed Care – PPO | Admitting: Student

## 2023-11-03 ENCOUNTER — Encounter: Payer: Self-pay | Admitting: Student

## 2023-11-03 ENCOUNTER — Ambulatory Visit
Admission: RE | Admit: 2023-11-03 | Discharge: 2023-11-03 | Disposition: A | Discharge status: Home / Self Care | Source: Ambulatory Visit | Attending: Student | Admitting: Student

## 2023-11-03 ENCOUNTER — Ambulatory Visit

## 2023-11-03 ENCOUNTER — Ambulatory Visit (INDEPENDENT_AMBULATORY_CARE_PROVIDER_SITE_OTHER): Admitting: Student

## 2023-11-03 VITALS — BP 112/74 | HR 86 | Ht 64.0 in | Wt 221.5 lb

## 2023-11-03 DIAGNOSIS — Z1231 Encounter for screening mammogram for malignant neoplasm of breast: Secondary | ICD-10-CM | POA: Insufficient documentation

## 2023-11-03 DIAGNOSIS — Z23 Encounter for immunization: Secondary | ICD-10-CM

## 2023-11-03 DIAGNOSIS — I10 Essential (primary) hypertension: Secondary | ICD-10-CM | POA: Diagnosis not present

## 2023-11-03 DIAGNOSIS — D869 Sarcoidosis, unspecified: Secondary | ICD-10-CM | POA: Diagnosis not present

## 2023-11-03 DIAGNOSIS — K219 Gastro-esophageal reflux disease without esophagitis: Secondary | ICD-10-CM | POA: Diagnosis not present

## 2023-11-03 DIAGNOSIS — E785 Hyperlipidemia, unspecified: Secondary | ICD-10-CM

## 2023-11-03 MED ORDER — PANTOPRAZOLE SODIUM 40 MG PO TBEC: 40.0000 mg | DELAYED_RELEASE_TABLET | Freq: Every day | ORAL | 1 refills | Status: AC

## 2023-11-03 MED ORDER — LOSARTAN POTASSIUM 50 MG PO TABS: 25.0000 mg | ORAL_TABLET | Freq: Two times a day (BID) | ORAL | 1 refills | Status: DC

## 2023-11-03 MED ORDER — HYDROCHLOROTHIAZIDE 12.5 MG PO TABS: 25.0000 mg | ORAL_TABLET | Freq: Every day | ORAL | 1 refills | Status: DC

## 2023-11-03 NOTE — Progress Notes (Unsigned)
 Established Patient Office Visit  Subjective   Patient ID: Susan Gregory, female    DOB: 1961/01/13  Age: 63 y.o. MRN: [id number]  Chief Complaint  Patient presents with   Establish Care    Patient is here today to establish care with new PCP    Susan Gregory with medical hx listed below presents today for transfer of care. Please refer to problem based charting for further details and assessment and plan of current problem and chronic medical conditions.  Patient Active Problem List   Diagnosis Date Noted   Aortic atherosclerosis 11/05/2023   Sarcoidosis 11/03/2023   Gastroesophageal reflux disease 08/29/2020   BMI 35.0-35.9,adult 08/29/2020   HLD (hyperlipidemia) 07/18/2014   Benign hypertension 07/18/2014   Encounter for general adult medical examination without abnormal findings 07/18/2014   Arthritis of knee, degenerative 07/18/2014      ROS Refer to HPI    Objective:     Outpatient Encounter Medications as of 11/03/2023  Medication Sig   acetaminophen (TYLENOL) 500 MG tablet Take 1,000 mg by mouth every 6 (six) hours as needed for moderate pain or headache.   amLODipine  (NORVASC ) 10 MG tablet Take 1 tablet (10 mg total) by mouth daily.   aspirin 81 MG tablet Take 81 mg by mouth daily.    Carboxymethylcellul-Glycerin (CLEAR EYES FOR DRY EYES OP) Place 1 drop into both eyes daily as needed (dry eyes).   folic acid (FOLVITE) 1 MG tablet Take 1 mg by mouth daily. rheumatology   ketoconazole  (NIZORAL ) 2 % shampoo Apply 1 application topically daily as needed (fungus).   methotrexate (RHEUMATREX) 2.5 MG tablet Take 10 mg by mouth once a week. Rheumatology   Multiple Vitamin (MULTIVITAMIN WITH MINERALS) TABS tablet Take 1 tablet by mouth 2 (two) times a week.   simvastatin  (ZOCOR ) 20 MG tablet Take 1 tablet (20 mg total) by mouth daily.   [DISCONTINUED] albuterol  (VENTOLIN  HFA) 108 (90 Base) MCG/ACT inhaler INHALE 2 PUFFS EVERY 6 HOURS AS NEEDED FOR WHEEZING    [DISCONTINUED] hydrochlorothiazide  (HYDRODIURIL ) 50 MG tablet Take 1 tablet (50 mg total) by mouth daily.   [DISCONTINUED] losartan  (COZAAR ) 25 MG tablet Take 1 tablet (25 mg total) by mouth 2 (two) times daily.   [DISCONTINUED] pantoprazole  (PROTONIX ) 40 MG tablet Take 1 tablet (40 mg total) by mouth daily.   clobetasol  (TEMOVATE ) 0.05 % external solution APPLY TO AFFECTED AREA TWICE A DAY (Patient not taking: Reported on 11/03/2023)   hydrochlorothiazide  (HYDRODIURIL ) 12.5 MG tablet Take 2 tablets (25 mg total) by mouth daily.   losartan  (COZAAR ) 50 MG tablet Take 1 tablet (50 mg total) by mouth daily.   pantoprazole  (PROTONIX ) 40 MG tablet Take 1 tablet (40 mg total) by mouth daily.   [DISCONTINUED] losartan  (COZAAR ) 50 MG tablet Take 0.5 tablets (25 mg total) by mouth 2 (two) times daily.   [DISCONTINUED] promethazine -dextromethorphan (PROMETHAZINE -DM) 6.25-15 MG/5ML syrup Take 5 mLs by mouth 4 (four) times daily as needed. (Patient not taking: Reported on 11/03/2023)   No facility-administered encounter medications on file as of 11/03/2023.    BP 112/74   Pulse 86   Ht 5' 4 (1.626 m)   Wt 221 lb 8 oz (100.5 kg)   LMP  (LMP Unknown) Comment: menopausal, denies preg  SpO2 97%   BMI 38.02 kg/m  BP Readings from Last 3 Encounters:  11/03/23 112/74  09/29/23 133/87  07/28/23 127/74    Physical Exam Constitutional:      Appearance: Normal appearance.  HENT:     Head: Normocephalic and atraumatic.     Mouth/Throat:     Mouth: Mucous membranes are moist.     Pharynx: Oropharynx is clear.  Cardiovascular:     Rate and Rhythm: Normal rate and regular rhythm.  Pulmonary:     Effort: Pulmonary effort is normal.     Breath sounds: No rhonchi or rales.  Abdominal:     General: Abdomen is flat. Bowel sounds are normal. There is no distension.     Palpations: Abdomen is soft.     Tenderness: There is no abdominal tenderness.  Musculoskeletal:        General: Normal range of motion.      Right lower leg: No edema.     Left lower leg: No edema.  Skin:    General: Skin is warm and dry.     Capillary Refill: Capillary refill takes less than 2 seconds.  Neurological:     General: No focal deficit present.     Mental Status: She is alert and oriented to person, place, and time.  Psychiatric:        Mood and Affect: Mood normal.        Behavior: Behavior normal.        11/03/2023    2:08 PM 04/08/2023    9:30 AM 12/29/2022    3:58 PM  Depression screen PHQ 2/9  Decreased Interest 0 0 0  Down, Depressed, Hopeless 0 0 0  PHQ - 2 Score 0 0 0  Altered sleeping 1 0   Tired, decreased energy 0 0   Change in appetite 1 0   Feeling bad or failure about yourself  0 0   Trouble concentrating 0 0   Moving slowly or fidgety/restless 0 0   Suicidal thoughts 0 0   PHQ-9 Score 2 0   Difficult doing work/chores Not difficult at all Not difficult at all        11/03/2023    2:09 PM 04/08/2023    9:31 AM 12/29/2022    3:58 PM 10/06/2022    8:19 AM  GAD 7 : Generalized Anxiety Score  Nervous, Anxious, on Edge 0 0 0 0  Control/stop worrying 0 0 0 0  Worry too much - different things 0 0 0 0  Trouble relaxing 0 0 0 0  Restless 0 0 0 0  Easily annoyed or irritable 0 0 0 0  Afraid - awful might happen 0 0 0 0  Total GAD 7 Score 0 0 0 0  Anxiety Difficulty Not difficult at all Not difficult at all  Not difficult at all    No results found for any visits on 11/03/23.  Last CBC Lab Results  Component Value Date   WBC 3.5 (L) 08/15/2017   HGB 14.5 08/15/2017   HCT 42.2 08/15/2017   MCV 86.6 08/15/2017   MCH 29.7 08/15/2017   RDW 14.1 08/15/2017   PLT 181 08/15/2017   Last metabolic panel Lab Results  Component Value Date   GLUCOSE 87 09/29/2023   NA 137 09/29/2023   K 3.1 (L) 09/29/2023   CL 99 09/29/2023   CO2 28 09/29/2023   BUN 11 09/29/2023   CREATININE 0.79 09/29/2023   GFRNONAA >60 09/29/2023   CALCIUM 9.2 09/29/2023   PHOS 3.3 10/06/2022   PROT 7.3  04/08/2023   ALBUMIN 4.5 04/08/2023   LABGLOB 2.8 04/08/2023   AGRATIO 1.6 03/04/2022   BILITOT 0.3 04/08/2023   ALKPHOS 74  04/08/2023   AST 18 04/08/2023   ALT 22 04/08/2023   ANIONGAP 10 09/29/2023   Last lipids Lab Results  Component Value Date   CHOL 163 04/08/2023   HDL 59 04/08/2023   LDLCALC 90 04/08/2023   TRIG 70 04/08/2023   CHOLHDL 3.9 09/26/2019   Last hemoglobin A1c No results found for: HGBA1C Last thyroid  functions No results found for: TSH, T3TOTAL, T4TOTAL, THYROIDAB Last vitamin D No results found for: 25OHVITD2, 25OHVITD3, VD25OH    The 10-year ASCVD risk score (Arnett DK, et al., 2019) is: 5.5%    Assessment & Plan:  Benign hypertension Assessment & Plan: BP is well controlled 112/74 daily. Medication includes amlodipine  10 mg daily, hydrochlorothiazide  50 mg daily, and losartan  25 mg twice daily. Decrease hydrochlorothiazide  to 12.5 mg daily and consolidate losartan  to 50 mg daily. She will keep log of Bps. Labs at next visit.   Orders: -     hydroCHLOROthiazide ; Take 2 tablets (25 mg total) by mouth daily.  Dispense: 90 tablet; Refill: 1 -     Losartan  Potassium; Take 1 tablet (50 mg total) by mouth daily.  Dispense: 90 tablet; Refill: 1  Encounter for immunization -     Flu vaccine HIGH DOSE PF(Fluzone Trivalent)  Sarcoidosis Assessment & Plan: Sees pulmonology and rheumatology. On methotrexate and folate. Labs are normal. Follow up with pulm and rheum.    Gastroesophageal reflux disease Assessment & Plan: Continue PPI.   Orders: -     Pantoprazole  Sodium; Take 1 tablet (40 mg total) by mouth daily.  Dispense: 90 tablet; Refill: 1  Hyperlipidemia, unspecified hyperlipidemia type Assessment & Plan: The 10-year ASCVD risk score (Arnett DK, et al., 2019) is: 5.5% Tolerating simvastatin  20 mg daily. Continue current medication.       Return in about 4 weeks (around 12/01/2023) for HTN, physical.    Harlene Saddler, MD

## 2023-11-03 NOTE — Assessment & Plan Note (Signed)
 Tolerating simvastatin  20 mg daily. Continue current medication.

## 2023-11-03 NOTE — Patient Instructions (Signed)
 Blood pressure Decrease hydrochlorothiazide  to 25 mg daily, (can cut your pill in half if able to)  Please take losartan  50 mg daily as one dose, I will send in 50 mg tablets Check blood pressure once a day and keep a log

## 2023-11-03 NOTE — Assessment & Plan Note (Signed)
 BP is well controlled 112/74 daily. Medication includes amlodipine  10 mg daily, hydrochlorothiazide  50 mg daily, and losartan  25 mg twice daily. Decrease hydrochlorothiazide  to 12.5 mg daily and consolidate losartan  to 50 mg daily. She will keep log of Bps. Labs at next visit.

## 2023-11-05 ENCOUNTER — Other Ambulatory Visit: Payer: Self-pay | Admitting: Student

## 2023-11-05 DIAGNOSIS — I700 Atherosclerosis of aorta: Secondary | ICD-10-CM | POA: Insufficient documentation

## 2023-11-05 DIAGNOSIS — R928 Other abnormal and inconclusive findings on diagnostic imaging of breast: Secondary | ICD-10-CM

## 2023-11-05 MED ORDER — LOSARTAN POTASSIUM 50 MG PO TABS: 50.0000 mg | ORAL_TABLET | Freq: Every day | ORAL | 1 refills | Status: DC

## 2023-11-05 NOTE — Assessment & Plan Note (Signed)
 Continue PPI.

## 2023-11-05 NOTE — Assessment & Plan Note (Signed)
 Sees pulmonology and rheumatology. On methotrexate and folate. Labs are normal. Follow up with pulm and rheum.

## 2023-11-08 ENCOUNTER — Ambulatory Visit
Admission: RE | Admit: 2023-11-08 | Discharge: 2023-11-08 | Disposition: A | Discharge status: Home / Self Care | Source: Ambulatory Visit | Attending: Student | Admitting: Student

## 2023-11-08 DIAGNOSIS — R92323 Mammographic fibroglandular density, bilateral breasts: Secondary | ICD-10-CM | POA: Diagnosis not present

## 2023-11-08 DIAGNOSIS — R928 Other abnormal and inconclusive findings on diagnostic imaging of breast: Secondary | ICD-10-CM

## 2023-11-08 DIAGNOSIS — N6323 Unspecified lump in the left breast, lower outer quadrant: Secondary | ICD-10-CM | POA: Diagnosis not present

## 2023-12-07 ENCOUNTER — Ambulatory Visit (INDEPENDENT_AMBULATORY_CARE_PROVIDER_SITE_OTHER): Admitting: Student

## 2023-12-07 ENCOUNTER — Encounter: Payer: Self-pay | Admitting: Student

## 2023-12-07 ENCOUNTER — Telehealth: Payer: Self-pay | Admitting: Student

## 2023-12-07 VITALS — BP 122/80 | HR 82 | Ht 64.0 in | Wt 218.0 lb

## 2023-12-07 DIAGNOSIS — Z23 Encounter for immunization: Secondary | ICD-10-CM

## 2023-12-07 DIAGNOSIS — E049 Nontoxic goiter, unspecified: Secondary | ICD-10-CM

## 2023-12-07 DIAGNOSIS — Z780 Asymptomatic menopausal state: Secondary | ICD-10-CM

## 2023-12-07 DIAGNOSIS — Z0000 Encounter for general adult medical examination without abnormal findings: Secondary | ICD-10-CM | POA: Diagnosis not present

## 2023-12-07 DIAGNOSIS — K31A0 Gastric intestinal metaplasia, unspecified: Secondary | ICD-10-CM

## 2023-12-07 DIAGNOSIS — E042 Nontoxic multinodular goiter: Secondary | ICD-10-CM

## 2023-12-07 DIAGNOSIS — K2270 Barrett's esophagus without dysplasia: Secondary | ICD-10-CM

## 2023-12-07 NOTE — Telephone Encounter (Signed)
 Okay to place referral? [facility]?

## 2023-12-07 NOTE — Assessment & Plan Note (Signed)
 Is planning joining silver  sneakers, encouraged regular physical activity Pap 09/26/2021 nlm, HPV- Last mammo 11/08/2023: diagnostic/US  complicated cyst of the L breast w/o signs of malignancy, mamogram in 1 year DXA scan ordered Last colonoscopy 11/2018,  hyperplastic polyp of the sigmoid, recall 5 year due to family hx, refer back to [facility]  Pneumonia vaccine administered

## 2023-12-07 NOTE — Progress Notes (Unsigned)
 Complete physical exam  Patient: Susan Gregory   DOB: 27-Apr-1960   63 y.o. Female  MRN: [id number]  Subjective:    Chief Complaint  Patient presents with   Annual Exam    Susan Gregory is a 63 y.o. female who presents today for a complete physical exam. She reports consuming a general diet. Walks the track near home most days. She generally feels well. She reports sleeping well. She does not have additional problems to discuss today.    {VISON DENTAL STD PSA (Optional):27386}  Patient Active Problem List   Diagnosis Date Noted   Aortic atherosclerosis 11/05/2023   Sarcoidosis 11/03/2023   Gastroesophageal reflux disease 08/29/2020   BMI 35.0-35.9,adult 08/29/2020   HLD (hyperlipidemia) 07/18/2014   Benign hypertension 07/18/2014   Encounter for general adult medical examination without abnormal findings 07/18/2014   Arthritis of knee, degenerative 07/18/2014      Patient Care Team: Lemon Raisin, MD as PCP - General (Internal Medicine)   Outpatient Medications Prior to Visit  Medication Sig   acetaminophen (TYLENOL) 500 MG tablet Take 1,000 mg by mouth every 6 (six) hours as needed for moderate pain or headache.   amLODipine  (NORVASC ) 10 MG tablet Take 1 tablet (10 mg total) by mouth daily.   aspirin 81 MG tablet Take 81 mg by mouth daily.    Carboxymethylcellul-Glycerin (CLEAR EYES FOR DRY EYES OP) Place 1 drop into both eyes daily as needed (dry eyes).   folic acid (FOLVITE) 1 MG tablet Take 1 mg by mouth daily. rheumatology   hydrochlorothiazide  (HYDRODIURIL ) 12.5 MG tablet Take 2 tablets (25 mg total) by mouth daily.   ketoconazole  (NIZORAL ) 2 % shampoo Apply 1 application topically daily as needed (fungus).   losartan  (COZAAR ) 50 MG tablet Take 1 tablet (50 mg total) by mouth daily.   methotrexate (RHEUMATREX) 2.5 MG tablet Take 10 mg by mouth once a week. Rheumatology   Multiple Vitamin (MULTIVITAMIN WITH MINERALS) TABS tablet Take 1 tablet by mouth 2 (two) times a  week.   pantoprazole  (PROTONIX ) 40 MG tablet Take 1 tablet (40 mg total) by mouth daily.   simvastatin  (ZOCOR ) 20 MG tablet Take 1 tablet (20 mg total) by mouth daily.   clobetasol  (TEMOVATE ) 0.05 % external solution APPLY TO AFFECTED AREA TWICE A DAY (Patient not taking: Reported on 12/07/2023)   No facility-administered medications prior to visit.    ROS Refer to HPI     Objective:    BP 124/84   Pulse 82   Ht 5' 4 (1.626 m)   Wt 218 lb (98.9 kg)   LMP  (LMP Unknown) Comment: menopausal, denies preg  SpO2 97%   BMI 37.42 kg/m  BP Readings from Last 3 Encounters:  12/07/23 124/84  11/03/23 112/74  09/29/23 133/87    Physical Exam      11/03/2023    2:08 PM 04/08/2023    9:30 AM 12/29/2022    3:58 PM  Depression screen PHQ 2/9  Decreased Interest 0 0 0  Down, Depressed, Hopeless 0 0 0  PHQ - 2 Score 0 0 0  Altered sleeping 1 0   Tired, decreased energy 0 0   Change in appetite 1 0   Feeling bad or failure about yourself  0 0   Trouble concentrating 0 0   Moving slowly or fidgety/restless 0 0   Suicidal thoughts 0 0   PHQ-9 Score 2 0   Difficult doing work/chores Not difficult at all Not difficult  at all       11/03/2023    2:09 PM 04/08/2023    9:31 AM 12/29/2022    3:58 PM 10/06/2022    8:19 AM  GAD 7 : Generalized Anxiety Score  Nervous, Anxious, on Edge 0 0 0 0  Control/stop worrying 0 0 0 0  Worry too much - different things 0 0 0 0  Trouble relaxing 0 0 0 0  Restless 0 0 0 0  Easily annoyed or irritable 0 0 0 0  Afraid - awful might happen 0 0 0 0  Total GAD 7 Score 0 0 0 0  Anxiety Difficulty Not difficult at all Not difficult at all  Not difficult at all    No results found for any visits on 12/07/23. Last CBC Lab Results  Component Value Date   WBC 3.5 (L) 08/15/2017   HGB 14.5 08/15/2017   HCT 42.2 08/15/2017   MCV 86.6 08/15/2017   MCH 29.7 08/15/2017   RDW 14.1 08/15/2017   PLT 181 08/15/2017   Last metabolic panel Lab Results   Component Value Date   GLUCOSE 87 09/29/2023   NA 137 09/29/2023   K 3.1 (L) 09/29/2023   CL 99 09/29/2023   CO2 28 09/29/2023   BUN 11 09/29/2023   CREATININE 0.79 09/29/2023   GFRNONAA >60 09/29/2023   CALCIUM 9.2 09/29/2023   PHOS 3.3 10/06/2022   PROT 7.3 04/08/2023   ALBUMIN 4.5 04/08/2023   LABGLOB 2.8 04/08/2023   AGRATIO 1.6 03/04/2022   BILITOT 0.3 04/08/2023   ALKPHOS 74 04/08/2023   AST 18 04/08/2023   ALT 22 04/08/2023   ANIONGAP 10 09/29/2023   Last lipids Lab Results  Component Value Date   CHOL 163 04/08/2023   HDL 59 04/08/2023   LDLCALC 90 04/08/2023   TRIG 70 04/08/2023   CHOLHDL 3.9 09/26/2019      Assessment & Plan:    Routine Health Maintenance and Physical Exam  Health Maintenance  Topic Date Due   Medicare Annual Wellness Visit  Never done   Zoster (Shingles) Vaccine (1 of 2) Never done   Pneumococcal Vaccine for age over 28 (2 of 2 - PCV) 12/06/2019   DEXA scan (bone density measurement)  Never done   COVID-19 Vaccine (7 - Pfizer risk 2025-26 season) 05/16/2024   Breast Cancer Screening  11/07/2025   DTaP/Tdap/Td vaccine (2 - Td or Tdap) 08/01/2026   Pap with HPV screening  09/27/2026   Colon Cancer Screening  11/17/2028   Flu Shot  Completed   Hepatitis B Vaccine  Aged Out   Meningitis B Vaccine  Aged Out   Hepatitis C Screening  Discontinued   HIV Screening  Discontinued    Discussed health benefits of physical activity, and encouraged her to engage in regular exercise appropriate for her age and condition.  There are no diagnoses linked to this encounter.  No follow-ups on file.     Harlene Saddler, MD

## 2023-12-07 NOTE — Telephone Encounter (Signed)
 Copied from CRM #[id number]. Topic: Referral - Question >> Dec 07, 2023 10:54 AM Olam RAMAN wrote: Reason for CRM: Pt calling for her recent visit, stated Dr advised for a referral to GI and colonoscopy and needed to know if it would be sent since she did call the GI and they stated she would need to tell dr to send a referral CB [phone]

## 2023-12-08 ENCOUNTER — Ambulatory Visit: Payer: Self-pay | Admitting: Student

## 2023-12-08 ENCOUNTER — Other Ambulatory Visit: Payer: Self-pay

## 2023-12-08 DIAGNOSIS — Z961 Presence of intraocular lens: Secondary | ICD-10-CM | POA: Diagnosis not present

## 2023-12-08 DIAGNOSIS — H35033 Hypertensive retinopathy, bilateral: Secondary | ICD-10-CM | POA: Diagnosis not present

## 2023-12-08 DIAGNOSIS — H43811 Vitreous degeneration, right eye: Secondary | ICD-10-CM | POA: Diagnosis not present

## 2023-12-08 DIAGNOSIS — D860 Sarcoidosis of lung: Secondary | ICD-10-CM | POA: Diagnosis not present

## 2023-12-08 DIAGNOSIS — E785 Hyperlipidemia, unspecified: Secondary | ICD-10-CM

## 2023-12-08 LAB — CBC WITH DIFFERENTIAL/PLATELET
Basophils Absolute: 0 x10E3/uL (ref 0.0–0.2)
Basos: 1 %
EOS (ABSOLUTE): 0.3 x10E3/uL (ref 0.0–0.4)
Eos: 6 %
Hematocrit: 42.7 % (ref 34.0–46.6)
Hemoglobin: 13.8 g/dL (ref 11.1–15.9)
Immature Grans (Abs): 0 x10E3/uL (ref 0.0–0.1)
Immature Granulocytes: 0 %
Lymphocytes Absolute: 1.4 x10E3/uL (ref 0.7–3.1)
Lymphs: 35 %
MCH: 30.5 pg (ref 26.6–33.0)
MCHC: 32.3 g/dL (ref 31.5–35.7)
MCV: 94 fL (ref 79–97)
Monocytes Absolute: 0.5 x10E3/uL (ref 0.1–0.9)
Monocytes: 13 %
Neutrophils Absolute: 1.8 x10E3/uL (ref 1.4–7.0)
Neutrophils: 45 %
Platelets: 223 x10E3/uL (ref 150–450)
RBC: 4.53 x10E6/uL (ref 3.77–5.28)
RDW: 13.6 % (ref 11.7–15.4)
WBC: 4.1 x10E3/uL (ref 3.4–10.8)

## 2023-12-08 LAB — COMPREHENSIVE METABOLIC PANEL WITH GFR
ALT: 18 IU/L (ref 0–32)
AST: 14 IU/L (ref 0–40)
Albumin: 4.4 g/dL (ref 3.9–4.9)
Alkaline Phosphatase: 73 IU/L (ref 49–135)
BUN/Creatinine Ratio: 14 (ref 12–28)
BUN: 11 mg/dL (ref 8–27)
Bilirubin Total: 0.4 mg/dL (ref 0.0–1.2)
CO2: 26 mmol/L (ref 20–29)
Calcium: 9.8 mg/dL (ref 8.7–10.3)
Chloride: 104 mmol/L (ref 96–106)
Creatinine, Ser: 0.81 mg/dL (ref 0.57–1.00)
Globulin, Total: 2.8 g/dL (ref 1.5–4.5)
Glucose: 95 mg/dL (ref 70–99)
Potassium: 3.7 mmol/L (ref 3.5–5.2)
Sodium: 144 mmol/L (ref 134–144)
Total Protein: 7.2 g/dL (ref 6.0–8.5)
eGFR: 81 mL/min/1.73 (ref >59)

## 2023-12-08 LAB — LIPID PANEL
Chol/HDL Ratio: 3 ratio (ref 0.0–4.4)
Cholesterol, Total: 177 mg/dL (ref 100–199)
HDL: 59 mg/dL (ref >39)
LDL Chol Calc (NIH): 106 mg/dL — ABNORMAL HIGH (ref 0–99)
Triglycerides: 65 mg/dL (ref 0–149)
VLDL Cholesterol Cal: 12 mg/dL (ref 5–40)

## 2023-12-08 LAB — TSH+FREE T4
Free T4: 1.38 ng/dL (ref 0.82–1.77)
TSH: 1.18 u[IU]/mL (ref 0.450–4.500)

## 2023-12-08 LAB — HEMOGLOBIN A1C
Est. average glucose Bld gHb Est-mCnc: 126 mg/dL
Hgb A1c MFr Bld: 6 % — ABNORMAL HIGH (ref 4.8–5.6)

## 2023-12-08 MED ORDER — SIMVASTATIN 40 MG PO TABS: 40.0000 mg | ORAL_TABLET | Freq: Every day | ORAL | 3 refills | Status: AC

## 2023-12-08 NOTE — Addendum Note (Signed)
 Addended by: CAMACHO OCAMPO, Cyler Kappes M on: 12/08/2023 01:32 PM   Modules accepted: Orders

## 2023-12-08 NOTE — Progress Notes (Signed)
 Spoke with patient and she agreed to up the simvastatin  40 and I have sent that in for her. She verbalized understanding of labs and will repeat in 1 year  JM

## 2023-12-08 NOTE — Telephone Encounter (Signed)
 Patient has been made aware and verbalized understanding.

## 2023-12-09 ENCOUNTER — Ambulatory Visit
Admission: RE | Admit: 2023-12-09 | Discharge: 2023-12-09 | Disposition: A | Discharge status: Home / Self Care | Source: Ambulatory Visit | Attending: Student | Admitting: Student

## 2023-12-09 DIAGNOSIS — E042 Nontoxic multinodular goiter: Secondary | ICD-10-CM | POA: Diagnosis not present

## 2023-12-09 DIAGNOSIS — E049 Nontoxic goiter, unspecified: Secondary | ICD-10-CM | POA: Diagnosis not present

## 2023-12-10 DIAGNOSIS — E042 Nontoxic multinodular goiter: Secondary | ICD-10-CM | POA: Insufficient documentation

## 2023-12-10 DIAGNOSIS — K2270 Barrett's esophagus without dysplasia: Secondary | ICD-10-CM | POA: Insufficient documentation

## 2023-12-10 NOTE — Assessment & Plan Note (Addendum)
 EGD 11/18/2018: baretts  no barrets, gastric metaplaia recommended repeat EGD in 1 year, repeat EGD in 2022 unable to see results. Will obtain records. Refer back to [facility].

## 2023-12-10 NOTE — Assessment & Plan Note (Signed)
 Saw Dr. Jeungel on 01/04/2023 for thyroid  nodule, remains asymptomatic. Recommended follow up US  in 1 year

## 2023-12-21 ENCOUNTER — Ambulatory Visit
Admission: RE | Admit: 2023-12-21 | Discharge: 2023-12-21 | Disposition: A | Discharge status: Home / Self Care | Source: Ambulatory Visit | Attending: Student | Admitting: Student

## 2023-12-21 DIAGNOSIS — Z780 Asymptomatic menopausal state: Secondary | ICD-10-CM | POA: Diagnosis not present

## 2023-12-21 DIAGNOSIS — M85832 Other specified disorders of bone density and structure, left forearm: Secondary | ICD-10-CM | POA: Diagnosis not present

## 2023-12-28 ENCOUNTER — Other Ambulatory Visit: Payer: Self-pay

## 2023-12-28 DIAGNOSIS — I10 Essential (primary) hypertension: Secondary | ICD-10-CM

## 2023-12-28 MED ORDER — AMLODIPINE BESYLATE 10 MG PO TABS: 10.0000 mg | ORAL_TABLET | Freq: Every day | ORAL | 1 refills | Status: AC

## 2023-12-28 MED ORDER — LOSARTAN POTASSIUM 50 MG PO TABS: 50.0000 mg | ORAL_TABLET | Freq: Every day | ORAL | 1 refills | Status: AC

## 2023-12-28 MED ORDER — HYDROCHLOROTHIAZIDE 12.5 MG PO TABS: 25.0000 mg | ORAL_TABLET | Freq: Every day | ORAL | 1 refills | Status: AC

## 2024-01-27 ENCOUNTER — Ambulatory Visit
Admission: EM | Admit: 2024-01-27 | Discharge: 2024-01-27 | Disposition: A | Discharge status: Home / Self Care | Source: Home / Self Care

## 2024-01-27 DIAGNOSIS — J069 Acute upper respiratory infection, unspecified: Secondary | ICD-10-CM

## 2024-01-27 MED ORDER — AMOXICILLIN-POT CLAVULANATE 875-125 MG PO TABS: 1.0000 | ORAL_TABLET | Freq: Two times a day (BID) | ORAL | 0 refills | Status: AC

## 2024-01-27 MED ORDER — IPRATROPIUM BROMIDE 0.06 % NA SOLN: 2.0000 | Freq: Four times a day (QID) | NASAL | 12 refills | Status: AC

## 2024-01-27 MED ORDER — PROMETHAZINE-DM 6.25-15 MG/5ML PO SYRP: 5.0000 mL | ORAL_SOLUTION | Freq: Four times a day (QID) | ORAL | 0 refills | Status: AC | PRN

## 2024-01-27 MED ORDER — BENZONATATE 100 MG PO CAPS: 200.0000 mg | ORAL_CAPSULE | Freq: Three times a day (TID) | ORAL | 0 refills | Status: AC

## 2024-01-27 NOTE — Discharge Instructions (Addendum)
 Take the Augmentin  twice daily with food for 7 days for treatment of your URI.  Use over-the-counter Tylenol and/or ibuprofen  as needed for any fever or pain.  Use the Atrovent  nasal spray, 2 squirts in each nostril every 6 hours, as needed for runny nose and postnasal drip.  Use the Tessalon  Perles every 8 hours during the day.  Take them with a small sip of water.  They may give you some numbness to the base of your tongue or a metallic taste in your mouth, this is normal.  Use the Promethazine  DM cough syrup at bedtime for cough and congestion.  It will make you drowsy so do not take it during the day.  Return for reevaluation or see your primary care provider for any new or worsening symptoms.

## 2024-01-27 NOTE — ED Provider Notes (Signed)
 [facility]    CSN: [id number] Arrival date & time: 01/27/24  1003      History   Chief Complaint Chief Complaint  Patient presents with   Cough    HPI Susan Gregory is a 63 y.o. female.   HPI  63 year old female with past medical [place] for hypertension, hyperlipidemia, GERD, Barrett's esophagus presents for evaluation of 5 days worth of respiratory symptoms that include runny nose, nasal congestion, particular nasal discharge, and a cough.  She denies any fever, ear pain, sore throat, shortness with, wheezing.  Past Medical History:  Diagnosis Date   Allergy    Barrett's esophagus    GERD (gastroesophageal reflux disease)    Hyperlipidemia    Hypertension     Patient Active Problem List   Diagnosis Date Noted   Gastric metaplasia of esophagus 12/10/2023   Multiple thyroid  nodules 12/10/2023   Aortic atherosclerosis 11/05/2023   Sarcoidosis 11/03/2023   Gastroesophageal reflux disease 08/29/2020   BMI 35.0-35.9,adult 08/29/2020   HLD (hyperlipidemia) 07/18/2014   Benign hypertension 07/18/2014   Annual physical exam 07/18/2014   Arthritis of knee, degenerative 07/18/2014    Past Surgical History:  Procedure Laterality Date   BREAST BIOPSY Right 09/04/2016   US  guided right breast biopsy. Coil marker neg   BREAST BIOPSY Right 09/07/2016   stereo biopsy. Ribbon marker neg   COLONOSCOPY     COLONOSCOPY WITH PROPOFOL  N/A 11/18/2018   Procedure: COLONOSCOPY WITH PROPOFOL ;  Surgeon: Gaylyn Gladis PENNER, MD;  Location: [facility] ENDOSCOPY;  Service: Endoscopy;  Laterality: N/A;   ESOPHAGOGASTRODUODENOSCOPY (EGD) WITH PROPOFOL  N/A 04/20/2016   Procedure: ESOPHAGOGASTRODUODENOSCOPY (EGD) WITH PROPOFOL ;  Surgeon: Gladis PENNER Gaylyn, MD;  Location: [facility] ENDOSCOPY;  Service: Endoscopy;  Laterality: N/A;   ESOPHAGOGASTRODUODENOSCOPY (EGD) WITH PROPOFOL  N/A 11/18/2018   Procedure: ESOPHAGOGASTRODUODENOSCOPY (EGD) WITH PROPOFOL ;  Surgeon: Gaylyn Gladis PENNER, MD;   Location: [facility] ENDOSCOPY;  Service: Endoscopy;  Laterality: N/A;   THROAT SURGERY     nodules taken off throat   TUBAL LIGATION      OB History   No obstetric history on file.      Home Medications    Prior to Admission medications  Medication Sig Start Date End Date Taking? Authorizing Provider  amoxicillin -clavulanate (AUGMENTIN ) 875-125 MG tablet Take 1 tablet by mouth every 12 (twelve) hours for 7 days. 01/27/24 02/03/24 Yes Bernardino Ditch, NP  benzonatate  (TESSALON ) 100 MG capsule Take 2 capsules (200 mg total) by mouth every 8 (eight) hours. 01/27/24  Yes Bernardino Ditch, NP  ipratropium (ATROVENT ) 0.06 % nasal spray Place 2 sprays into both nostrils 4 (four) times daily. 01/27/24  Yes Bernardino Ditch, NP  promethazine -dextromethorphan (PROMETHAZINE -DM) 6.25-15 MG/5ML syrup Take 5 mLs by mouth 4 (four) times daily as needed. 01/27/24  Yes Bernardino Ditch, NP  acetaminophen (TYLENOL) 500 MG tablet Take 1,000 mg by mouth every 6 (six) hours as needed for moderate pain or headache.    [provider]  amLODipine  (NORVASC ) 10 MG tablet Take 1 tablet (10 mg total) by mouth daily. 12/28/23   Lemon Raisin, MD  aspirin 81 MG tablet Take 81 mg by mouth daily.     [provider]  Carboxymethylcellul-Glycerin (CLEAR EYES FOR DRY EYES OP) Place 1 drop into both eyes daily as needed (dry eyes).    [provider]  clobetasol  (TEMOVATE ) 0.05 % external solution APPLY TO AFFECTED AREA TWICE A DAY Patient not taking: Reported on 12/07/2023 03/13/19   Joshua Cathryne BROCKS, MD  folic  acid (FOLVITE) 1 MG tablet Take 1 mg by mouth daily. rheumatology 12/29/20   [provider]  hydrochlorothiazide  (HYDRODIURIL ) 12.5 MG tablet Take 2 tablets (25 mg total) by mouth daily. 12/28/23   Lemon Raisin, MD  ketoconazole  (NIZORAL ) 2 % shampoo Apply 1 application topically daily as needed (fungus). 03/14/19   [provider]  losartan  (COZAAR ) 50 MG tablet Take 1 tablet (50 mg  total) by mouth daily. 12/28/23   Lemon Raisin, MD  methotrexate (RHEUMATREX) 2.5 MG tablet Take 10 mg by mouth once a week. Rheumatology 02/23/21   [provider]  Multiple Vitamin (MULTIVITAMIN WITH MINERALS) TABS tablet Take 1 tablet by mouth 2 (two) times a week.    [provider]  pantoprazole  (PROTONIX ) 40 MG tablet Take 1 tablet (40 mg total) by mouth daily. 11/03/23   Lemon Raisin, MD  simvastatin  (ZOCOR ) 40 MG tablet Take 1 tablet (40 mg total) by mouth at bedtime. 12/08/23   Lemon Raisin, MD    Family History Family History  Problem Relation Age of Onset   Breast cancer Cousin        mat cousin    Social History Social History[1]   Allergies   Patient has no known allergies.   Review of Systems Review of Systems  Constitutional:  Negative for fever.  HENT:  Positive for congestion and rhinorrhea. Negative for ear pain and sore throat.   Respiratory:  Positive for cough. Negative for shortness of breath and wheezing.      Physical Exam Triage Vital Signs ED Triage Vitals  Encounter Vitals Group     BP 01/27/24 1047 (!) 130/94     Girls Systolic BP Percentile --      Girls Diastolic BP Percentile --      Boys Systolic BP Percentile --      Boys Diastolic BP Percentile --      Pulse Rate 01/27/24 1047 75     Resp 01/27/24 1047 16     Temp 01/27/24 1047 98.4 F (36.9 C)     Temp Source 01/27/24 1047 Oral     SpO2 01/27/24 1047 97 %     Weight 01/27/24 1046 218 lb (98.9 kg)     Height --      Head Circumference --      Peak Flow --      Pain Score 01/27/24 1049 0     Pain Loc --      Pain Education --      Exclude from Growth Chart --    No data found.  Updated Vital Signs BP (!) 130/94 (BP Location: Left Arm)   Pulse 75   Temp 98.4 F (36.9 C) (Oral)   Resp 16   Wt 218 lb (98.9 kg)   LMP  (LMP Unknown) Comment: menopausal, denies preg  SpO2 97%   BMI 37.42 kg/m   Visual Acuity Right Eye Distance:   Left Eye Distance:    Bilateral Distance:    Right Eye Near:   Left Eye Near:    Bilateral Near:     Physical Exam Vitals and nursing note reviewed.  Constitutional:      Appearance: Normal appearance. She is not ill-appearing.  HENT:     Head: Normocephalic and atraumatic.     Right Ear: Tympanic membrane, ear canal and external ear normal. There is no impacted cerumen.     Left Ear: Tympanic membrane, ear canal and external ear normal. There is no impacted cerumen.  Nose: Congestion and rhinorrhea present.     Comments: Nasal mucosa is edematous and erythematous with yellow discharge in both nares.    Mouth/Throat:     Mouth: Mucous membranes are moist.     Pharynx: Oropharynx is clear. No oropharyngeal exudate or posterior oropharyngeal erythema.  Cardiovascular:     Rate and Rhythm: Normal rate and regular rhythm.     Pulses: Normal pulses.     Heart sounds: Normal heart sounds. No murmur heard.    No friction rub. No gallop.  Pulmonary:     Effort: Pulmonary effort is normal.     Breath sounds: Normal breath sounds. No wheezing, rhonchi or rales.  Musculoskeletal:     Cervical back: Normal range of motion and neck supple. No tenderness.  Lymphadenopathy:     Cervical: No cervical adenopathy.  Skin:    General: Skin is warm and dry.     Capillary Refill: Capillary refill takes less than 2 seconds.     Findings: No rash.  Neurological:     General: No focal deficit present.     Mental Status: She is alert and oriented to person, place, and time.      UC Treatments / Results  Labs (all labs ordered are listed, but only abnormal results are displayed) Labs Reviewed - No data to display  EKG   Radiology No results found.  Procedures Procedures (including critical care time)  Medications Ordered in UC Medications - No data to display  Initial Impression / Assessment and Plan / UC Course  I have reviewed the triage vital signs and the nursing notes.  Pertinent labs &  imaging results that were available during my care of the patient were reviewed by me and considered in my medical decision making (see chart for details).   Patient is a nontoxic-appearing 63 year old female presenting for evaluation of 1 week worth of respiratory symptoms as outlined in HPI above.  She reports that she is coughing up mucus but it sounds more like she is actually expectorating postnasal drip that collects in her throat.  She is able to speak in full sentences without dyspnea or tachypnea.  Her lungs are clear to auscultation all fields.  I will treat her for URI with cough and congestion with a 7-day course of Augmentin  along with Atrovent  nasal spray for nasal congestion and Tessalon  Perles and Promethazine  DM cough syrup for cough and congestion.  Return precautions reviewed   Final Clinical Impressions(s) / UC Diagnoses   Final diagnoses:  URI with cough and congestion     Discharge Instructions      Take the Augmentin  twice daily with food for 7 days for treatment of your URI.  Use over-the-counter Tylenol and/or ibuprofen  as needed for any fever or pain.  Use the Atrovent  nasal spray, 2 squirts in each nostril every 6 hours, as needed for runny nose and postnasal drip.  Use the Tessalon  Perles every 8 hours during the day.  Take them with a small sip of water.  They may give you some numbness to the base of your tongue or a metallic taste in your mouth, this is normal.  Use the Promethazine  DM cough syrup at bedtime for cough and congestion.  It will make you drowsy so do not take it during the day.  Return for reevaluation or see your primary care provider for any new or worsening symptoms.      ED Prescriptions     Medication Sig Dispense Auth.  Provider   amoxicillin -clavulanate (AUGMENTIN ) 875-125 MG tablet Take 1 tablet by mouth every 12 (twelve) hours for 7 days. 14 tablet Bernardino Ditch, NP   benzonatate  (TESSALON ) 100 MG capsule Take 2 capsules (200 mg  total) by mouth every 8 (eight) hours. 21 capsule Bernardino Ditch, NP   ipratropium (ATROVENT ) 0.06 % nasal spray Place 2 sprays into both nostrils 4 (four) times daily. 15 mL Bernardino Ditch, NP   promethazine -dextromethorphan (PROMETHAZINE -DM) 6.25-15 MG/5ML syrup Take 5 mLs by mouth 4 (four) times daily as needed. 118 mL Bernardino Ditch, NP      PDMP not reviewed this encounter.    [1]  Social History Tobacco Use   Smoking status: Never   Smokeless tobacco: Never  Vaping Use   Vaping status: Never Used  Substance Use Topics   Alcohol use: No    Alcohol/week: 0.0 standard drinks of alcohol   Drug use: No     Bernardino Ditch, NP 01/27/24 1114

## 2024-01-27 NOTE — ED Triage Notes (Signed)
 Pt present cough with congestion. Symptoms started on Saturday.  Pt took OTC medication with no relief.

## 2024-06-06 ENCOUNTER — Ambulatory Visit: Admitting: Student
